# Patient Record
Sex: Female | Born: 1980
Health system: Southern US, Community
[De-identification: ages and names within clinical notes are randomized; demographics above are authoritative.]

## PROBLEM LIST (undated history)

## (undated) DIAGNOSIS — J45909 Unspecified asthma, uncomplicated: Secondary | ICD-10-CM

## (undated) DIAGNOSIS — E0590 Thyrotoxicosis, unspecified without thyrotoxic crisis or storm: Secondary | ICD-10-CM

## (undated) DIAGNOSIS — R011 Cardiac murmur, unspecified: Secondary | ICD-10-CM

## (undated) DIAGNOSIS — G8929 Other chronic pain: Secondary | ICD-10-CM

## (undated) DIAGNOSIS — E669 Obesity, unspecified: Secondary | ICD-10-CM

## (undated) DIAGNOSIS — M545 Low back pain, unspecified: Secondary | ICD-10-CM

## (undated) DIAGNOSIS — I071 Rheumatic tricuspid insufficiency: Secondary | ICD-10-CM

## (undated) DIAGNOSIS — R931 Abnormal findings on diagnostic imaging of heart and coronary circulation: Secondary | ICD-10-CM

## (undated) DIAGNOSIS — E0500 Thyrotoxicosis with diffuse goiter without thyrotoxic crisis or storm: Secondary | ICD-10-CM

## (undated) DIAGNOSIS — I340 Nonrheumatic mitral (valve) insufficiency: Secondary | ICD-10-CM

## (undated) DIAGNOSIS — D649 Anemia, unspecified: Secondary | ICD-10-CM

## (undated) DIAGNOSIS — K219 Gastro-esophageal reflux disease without esophagitis: Secondary | ICD-10-CM

## (undated) DIAGNOSIS — I10 Essential (primary) hypertension: Secondary | ICD-10-CM

## (undated) DIAGNOSIS — Z973 Presence of spectacles and contact lenses: Secondary | ICD-10-CM

## (undated) DIAGNOSIS — Z720 Tobacco use: Secondary | ICD-10-CM

## (undated) HISTORY — DX: Thyrotoxicosis, unspecified without thyrotoxic crisis or storm: E05.90

## (undated) HISTORY — DX: Anemia, unspecified: D64.9

## (undated) HISTORY — DX: Obesity, unspecified: E66.9

## (undated) HISTORY — DX: Abnormal findings on diagnostic imaging of heart and coronary circulation: R93.1

## (undated) HISTORY — DX: Tobacco use: Z72.0

## (undated) HISTORY — PX: MANDIBLE SURGERY: SHX707

## (undated) HISTORY — PX: ECTOPIC PREGNANCY SURGERY: SHX613

## (undated) HISTORY — DX: Nonrheumatic mitral (valve) insufficiency: I34.0

## (undated) HISTORY — PX: TUBAL LIGATION: SHX77

## (undated) HISTORY — DX: Rheumatic tricuspid insufficiency: I07.1

---
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
## 1998-12-19 ENCOUNTER — Encounter: Payer: Self-pay | Admitting: Emergency Medicine

## 1998-12-19 ENCOUNTER — Emergency Department (HOSPITAL_COMMUNITY): Admission: EM | Admit: 1998-12-19 | Discharge: 1998-12-19 | Payer: Self-pay | Admitting: Emergency Medicine

## 1999-07-24 ENCOUNTER — Other Ambulatory Visit: Admission: RE | Admit: 1999-07-24 | Discharge: 1999-07-24 | Payer: Self-pay | Admitting: Obstetrics and Gynecology

## 1999-08-16 ENCOUNTER — Encounter: Admission: RE | Admit: 1999-08-16 | Discharge: 1999-09-07 | Payer: Self-pay | Admitting: Orthopedic Surgery

## 2000-06-30 ENCOUNTER — Emergency Department (HOSPITAL_COMMUNITY): Admission: EM | Admit: 2000-06-30 | Discharge: 2000-06-30 | Payer: Self-pay

## 2000-06-30 ENCOUNTER — Encounter: Payer: Self-pay | Admitting: Emergency Medicine

## 2001-03-09 ENCOUNTER — Encounter: Payer: Self-pay | Admitting: Emergency Medicine

## 2001-03-09 ENCOUNTER — Emergency Department (HOSPITAL_COMMUNITY): Admission: EM | Admit: 2001-03-09 | Discharge: 2001-03-09 | Payer: Self-pay | Admitting: Emergency Medicine

## 2001-05-13 ENCOUNTER — Other Ambulatory Visit: Admission: RE | Admit: 2001-05-13 | Discharge: 2001-05-13 | Payer: Self-pay | Admitting: *Deleted

## 2003-01-31 ENCOUNTER — Emergency Department (HOSPITAL_COMMUNITY): Admission: EM | Admit: 2003-01-31 | Discharge: 2003-01-31 | Payer: Self-pay | Admitting: Emergency Medicine

## 2003-04-25 ENCOUNTER — Emergency Department (HOSPITAL_COMMUNITY): Admission: AD | Admit: 2003-04-25 | Discharge: 2003-04-25 | Payer: Self-pay | Admitting: Family Medicine

## 2003-07-06 ENCOUNTER — Inpatient Hospital Stay (HOSPITAL_COMMUNITY): Admission: AD | Admit: 2003-07-06 | Discharge: 2003-07-07 | Payer: Self-pay | Admitting: *Deleted

## 2003-07-09 ENCOUNTER — Inpatient Hospital Stay (HOSPITAL_COMMUNITY): Admission: AD | Admit: 2003-07-09 | Discharge: 2003-07-09 | Payer: Self-pay | Admitting: Obstetrics & Gynecology

## 2003-07-10 ENCOUNTER — Inpatient Hospital Stay (HOSPITAL_COMMUNITY): Admission: AD | Admit: 2003-07-10 | Discharge: 2003-07-10 | Payer: Self-pay | Admitting: Obstetrics and Gynecology

## 2003-07-13 ENCOUNTER — Inpatient Hospital Stay (HOSPITAL_COMMUNITY): Admission: AD | Admit: 2003-07-13 | Discharge: 2003-07-13 | Payer: Self-pay | Admitting: Obstetrics and Gynecology

## 2003-07-16 ENCOUNTER — Inpatient Hospital Stay (HOSPITAL_COMMUNITY): Admission: AD | Admit: 2003-07-16 | Discharge: 2003-07-16 | Payer: Self-pay | Admitting: Obstetrics and Gynecology

## 2003-07-23 ENCOUNTER — Inpatient Hospital Stay (HOSPITAL_COMMUNITY): Admission: AD | Admit: 2003-07-23 | Discharge: 2003-07-23 | Payer: Self-pay | Admitting: Obstetrics and Gynecology

## 2003-07-30 ENCOUNTER — Inpatient Hospital Stay (HOSPITAL_COMMUNITY): Admission: AD | Admit: 2003-07-30 | Discharge: 2003-07-30 | Payer: Self-pay | Admitting: Obstetrics and Gynecology

## 2003-08-06 ENCOUNTER — Inpatient Hospital Stay (HOSPITAL_COMMUNITY): Admission: AD | Admit: 2003-08-06 | Discharge: 2003-08-06 | Payer: Self-pay | Admitting: Obstetrics and Gynecology

## 2003-08-14 ENCOUNTER — Inpatient Hospital Stay (HOSPITAL_COMMUNITY): Admission: AD | Admit: 2003-08-14 | Discharge: 2003-08-16 | Payer: Self-pay | Admitting: Obstetrics and Gynecology

## 2003-08-16 ENCOUNTER — Encounter (INDEPENDENT_AMBULATORY_CARE_PROVIDER_SITE_OTHER): Payer: Self-pay | Admitting: Specialist

## 2004-05-01 ENCOUNTER — Emergency Department (HOSPITAL_COMMUNITY): Admission: EM | Admit: 2004-05-01 | Discharge: 2004-05-01 | Payer: Self-pay | Admitting: Emergency Medicine

## 2004-05-07 ENCOUNTER — Emergency Department (HOSPITAL_COMMUNITY): Admission: EM | Admit: 2004-05-07 | Discharge: 2004-05-07 | Payer: Self-pay | Admitting: Emergency Medicine

## 2005-03-08 ENCOUNTER — Emergency Department (HOSPITAL_COMMUNITY): Admission: EM | Admit: 2005-03-08 | Discharge: 2005-03-08 | Payer: Self-pay | Admitting: Family Medicine

## 2005-11-21 ENCOUNTER — Emergency Department (HOSPITAL_COMMUNITY): Admission: EM | Admit: 2005-11-21 | Discharge: 2005-11-21 | Payer: Self-pay | Admitting: Family Medicine

## 2006-03-11 ENCOUNTER — Emergency Department (HOSPITAL_COMMUNITY): Admission: EM | Admit: 2006-03-11 | Discharge: 2006-03-11 | Payer: Self-pay | Admitting: Emergency Medicine

## 2006-06-15 ENCOUNTER — Emergency Department (HOSPITAL_COMMUNITY): Admission: EM | Admit: 2006-06-15 | Discharge: 2006-06-15 | Payer: Self-pay | Admitting: Emergency Medicine

## 2006-10-03 ENCOUNTER — Other Ambulatory Visit: Admission: RE | Admit: 2006-10-03 | Discharge: 2006-10-03 | Payer: Self-pay | Admitting: Gynecology

## 2006-10-22 ENCOUNTER — Ambulatory Visit (HOSPITAL_COMMUNITY): Admission: RE | Admit: 2006-10-22 | Discharge: 2006-10-22 | Payer: Self-pay | Admitting: Gynecology

## 2007-02-22 ENCOUNTER — Emergency Department (HOSPITAL_COMMUNITY): Admission: EM | Admit: 2007-02-22 | Discharge: 2007-02-22 | Payer: Self-pay | Admitting: Emergency Medicine

## 2007-04-21 ENCOUNTER — Emergency Department (HOSPITAL_COMMUNITY): Admission: EM | Admit: 2007-04-21 | Discharge: 2007-04-21 | Payer: Self-pay | Admitting: Emergency Medicine

## 2007-12-30 ENCOUNTER — Emergency Department (HOSPITAL_COMMUNITY): Admission: EM | Admit: 2007-12-30 | Discharge: 2007-12-30 | Payer: Self-pay | Admitting: Emergency Medicine

## 2008-05-07 ENCOUNTER — Emergency Department (HOSPITAL_COMMUNITY): Admission: EM | Admit: 2008-05-07 | Discharge: 2008-05-07 | Payer: Self-pay | Admitting: Emergency Medicine

## 2008-08-03 ENCOUNTER — Emergency Department (HOSPITAL_COMMUNITY): Admission: EM | Admit: 2008-08-03 | Discharge: 2008-08-03 | Payer: Self-pay | Admitting: Family Medicine

## 2008-11-05 ENCOUNTER — Encounter: Admission: RE | Admit: 2008-11-05 | Discharge: 2008-11-05 | Payer: Self-pay | Admitting: Family Medicine

## 2008-11-14 ENCOUNTER — Inpatient Hospital Stay (HOSPITAL_COMMUNITY): Admission: EM | Admit: 2008-11-14 | Discharge: 2008-11-15 | Payer: Self-pay | Admitting: Emergency Medicine

## 2008-12-24 ENCOUNTER — Ambulatory Visit (HOSPITAL_BASED_OUTPATIENT_CLINIC_OR_DEPARTMENT_OTHER): Admission: RE | Admit: 2008-12-24 | Discharge: 2008-12-24 | Payer: Self-pay | Admitting: Otolaryngology

## 2009-02-02 ENCOUNTER — Emergency Department (HOSPITAL_COMMUNITY): Admission: EM | Admit: 2009-02-02 | Discharge: 2009-02-02 | Payer: Self-pay | Admitting: Emergency Medicine

## 2010-03-06 ENCOUNTER — Emergency Department (HOSPITAL_COMMUNITY): Admission: EM | Admit: 2010-03-06 | Discharge: 2010-03-06 | Payer: Self-pay | Admitting: Family Medicine

## 2010-04-23 HISTORY — PX: ECTOPIC PREGNANCY SURGERY: SHX613

## 2010-07-04 LAB — POCT RAPID STREP A (OFFICE): Streptococcus, Group A Screen (Direct): NEGATIVE

## 2010-07-28 LAB — BASIC METABOLIC PANEL
BUN: 5 mg/dL — ABNORMAL LOW (ref 6–23)
CO2: 23 mEq/L (ref 19–32)
GFR calc Af Amer: >60 mL/min (ref >60)
Glucose, Bld: 77 mg/dL (ref 70–99)
Potassium: 4.4 mEq/L (ref 3.5–5.1)

## 2010-07-30 LAB — BASIC METABOLIC PANEL
BUN: 8 mg/dL (ref 6–23)
CO2: 24 mEq/L (ref 19–32)
Calcium: 8.3 mg/dL — ABNORMAL LOW (ref 8.4–10.5)
Creatinine, Ser: 0.87 mg/dL (ref 0.4–1.2)
GFR calc Af Amer: >60 mL/min (ref >60)
GFR calc non Af Amer: >60 mL/min (ref >60)
Glucose, Bld: 103 mg/dL — ABNORMAL HIGH (ref 70–99)

## 2010-07-30 LAB — CBC
HCT: 38.4 % (ref 36.0–46.0)
Platelets: 251 10*3/uL (ref 150–400)
RBC: 4.06 MIL/uL (ref 3.87–5.11)
RDW: 12.9 % (ref 11.5–15.5)

## 2010-09-05 NOTE — Op Note (Signed)
NAMETATIONNA, FULLARD              ACCOUNT NO.:  [id number]   MEDICAL RECORD NO.:  [id number]          PATIENT TYPE:  INP   LOCATION:  5033                         FACILITY:  [facility]   PHYSICIAN:  Kristine Garbe. Ezzard Standing, M.D.DATE OF BIRTH:  09/01/1980   DATE OF PROCEDURE:  11/14/2008  DATE OF DISCHARGE:                               OPERATIVE REPORT   PREOPERATIVE DIAGNOSIS:  Open right body mandibular fracture.   POSTOPERATIVE DIAGNOSIS:  Open right body mandibular fracture.   OPERATION PERFORMED:  Closed reduction of right body mandibular fracture  with maxillomandibular fixation.  Closure of 3-cm intraoral right  laceration.   SURGEON:  Kristine Garbe. Ezzard Standing, MD   ANESTHESIA:  General endotracheal.   COMPLICATIONS:  None.   BLOOD LOSS:  Minimal.   BRIEF CLINICAL NOTE:  Anne [name] is a 30 year old female, who  reported that she fell when she got out of bed earlier tonight at about  midnight, landing on the right side of her face.  She was brought to the  emergency room, and CT scan demonstrated a displaced right body  mandibular fracture, which was opened intraorally.  She had lost the  crown of tooth #28 with the roots apparently still intact.  On  examination, she has poor alignment of her dentition and has an open  intraoral laceration with one missed tooth at approximately tooth #28.  She had several dental cavities on the molars, posterior on the left  side.  There is no external facial lacerations.  She is taken to the  operating room at this time for maxillomandibular fixation and reduction  of mandibular fracture.   DESCRIPTION OF PROCEDURE:  The patient was brought to the operating  room.  She received 1 g of Ancef in the ER and a second g of Ancef  preoperatively in the OR.  The mouth was prepped with Betadine and  rinsed out with saline.  Four maxillomandibular fixation screws were  placed, two 8-mm screws were placed superiorly just medial to the  incisors,  and then two 12-mm length screws were placed through the  mandible inferiorly just inferior to the incisors.  The fracture segment  was reduced, and the patient was brought into recently good occlusion.  A 24-gauge wire was brought through the maxillomandibular fixation  screws and twisted down to secure her occlusion and reduction of the  mandibular fracture.  There is a small laceration around the fracture  where she lost the crown of  tooth #28, and this was closed with 3 interrupted 4-0 chromic sutures.  She received a 10 mg of Decadron postoperatively.  The patient was  awoken from anesthesia and transferred to the recovery room, postop  doing well.   DISPOSITION:  The patient will be admitted for 24-hour observation on  liquid diet and perioperative antibiotics.           ______________________________  Kristine Garbe Ezzard Standing, M.D.     CEN/MEDQ  D:  11/14/2008  T:  11/14/2008  Job:  [id number]

## 2010-09-05 NOTE — H&P (Signed)
Anne [name], Anne [name]              ACCOUNT NO.:  [id number]   MEDICAL RECORD NO.:  [id number]          PATIENT TYPE:  INP   LOCATION:  5033                         FACILITY:  [facility]   PHYSICIAN:  Kristine Garbe. Ezzard Standing, M.D.DATE OF BIRTH:  [birth year]   DATE OF ADMISSION:  11/14/2008  DATE OF DISCHARGE:                              HISTORY & PHYSICAL   REASON FOR ADMISSION:  Mandibular fracture.   HISTORY OF PRESENTING ILLNESS:  Anne [name] is a 30 year old female,  who is getting out of bed about midnight today, fell landing on the  right side of her face, sustaining a displaced open right mandibular  fracture of the body of the mandible.  She presented to the emergency  room in no acute distress except for some pain of the jaw.   PAST MEDICAL HISTORY:  Significant for hypertension for which she was  prescribed medication a couple of weeks ago, but has not taken any  medication for hypertension presently.   ALLERGIES:  She has no known drug allergies.   MEDICATIONS:  No other medications.   SOCIAL HISTORY:  She does smoke about a pack a day.   PHYSICAL EXAMINATION:  GENERAL:  The patient with some mild swelling on  right side of her jaw and in no acute distress.  INTRAORAL:  The crown of tooth #28 to be missing with an open fracture  of the body and mandible on the parasymphyseal area in the right side.  NECK:  No trauma.  No swelling.  No adenopathy.  CARDIAC:  Regular rate and rhythm without murmurs.  LUNGS:  Clear.  ABDOMEN:  Soft and nontender.  EXTREMITIES:  No evidence of trauma.   IMPRESSION:  Right displaced body mandibular fracture.   PLAN:  The patient will be admitted for a maxillomandibular fixation of  a right body mandibular fracture.  She received 1 g Ancef in the ER.           ______________________________  Kristine Garbe. Ezzard Standing, M.D.     CEN/MEDQ  D:  11/14/2008  T:  11/14/2008  Job:  [id number]

## 2010-09-08 NOTE — Discharge Summary (Signed)
NAME:  Anne [name], Anne [name]                       ACCOUNT NO.:  [id number]   MEDICAL RECORD NO.:  [id number]                   PATIENT TYPE:  INP   LOCATION:  9318                                 FACILITY[facility]  [facility]   PHYSICIAN:  Zenaida Niece, M.D.             DATE OF BIRTH:  08-23-1980   DATE OF ADMISSION:  08/14/2003  DATE OF DISCHARGE:                                 DISCHARGE SUMMARY   ADMISSION DIAGNOSIS:  Ectopic pregnancy.   DISCHARGE DIAGNOSIS:  Rupturing right ectopic pregnancy.   PROCEDURE:  Laparotomy with partial right salpingectomy and adhesiolysis.   HISTORY AND PHYSICAL:  Briefly, this is a 30 year old black female gravida 1  para 0 with an LMP of August 14, 2003 who has been followed for a known  ectopic pregnancy.  She has had methotrexate x2.  She has had a falling beta  hCG although the last one was not optimal.  She came in on the day of  admission complaining of vaginal bleeding and right lower quadrant pain.  Ultrasound revealed an increasing mass in the right cornua of the uterus  with free fluid in the abdomen.  The hCG had decreased from approximately  3400 to 689.  Hemoglobin was stable at 11.5.  Past history is  noncontributory.  Physical exam significant for an abdomen that was soft,  nondistended, tender in the right lower quadrant with rebound and guarding.   HOSPITAL COURSE:  The patient admitted and taken to the operating room.  She  had a laparotomy with a right partial salpingectomy for a large ectopic at  the origin of the right tube that was in the process of rupturing.  It is  possible there was a cornual component to this.  There was approximately 200  mL of blood in the abdomen, 150 mL of blood loss from the actual surgery.  The patient tolerated the procedure well.  Postoperatively she had no  complications.  Preoperative hemoglobin 11.5, postoperative 10.9.  On  postoperative day #2 she was ambulating, tolerating a diet, and felt to be  stable  for discharge home.   DISCHARGE INSTRUCTIONS:  Regular diet, pelvic rest, no strenuous activity.  Follow up is in 2 days for staple removal.   MEDICATIONS:  1. Percocet #40 one to two p.o. q.4-6h. p.r.n. pain.  2. Over-the-counter ibuprofen as needed.                                               Zenaida Niece, M.D.    TDM/MEDQ  D:  08/16/2003  T:  08/16/2003  Job:  [id number]

## 2010-09-08 NOTE — Op Note (Signed)
NAME:  Anne [name], Anne [name]                       ACCOUNT NO.:  [id number]   MEDICAL RECORD NO.:  [id number]                   PATIENT TYPE:  INP   LOCATION:  9318                                 [facility]   PHYSICIAN:  Zenaida Niece, M.D.             DATE OF BIRTH:  05/12/1980   DATE OF PROCEDURE:  08/14/2003  DATE OF DISCHARGE:                                 OPERATIVE REPORT   PREOPERATIVE DIAGNOSIS:  Right ectopic pregnancy.   POSTOPERATIVE DIAGNOSIS:  Rupturing right ectopic pregnancy.   PROCEDURE:  Laparotomy with partial right salpingectomy.   SURGEON:  Zenaida Niece, M.D.   ANESTHESIA:  General endotracheal tube.   ESTIMATED BLOOD LOSS:  From the case, 150 cc.  Plus she had approximately  200 cc of blood in her abdomen.   FINDINGS:  Large ectopic pregnancy at the origin of the right tube with  possible some component of a cornual pregnancy.  The omentum was adherent to  the ectopic, which was bleeding and in a process of rupturing.  There were  some adhesions around the left ovary.   PROCEDURE IN DETAIL:  The patient was taken to the operating room and placed  in the dorsal supine position.  General anesthesia was induced.  The abdomen  was prepped and draped in the usual sterile fashion and a Foley catheter  inserted.  The abdomen was then entered via a small Pfannenstiel incision.  Once the peritoneum was entered, there was noted to be blood in the  abdominal cavity and this was suctioned out.  A self-retaining retractor was  placed and bowels packed out of the pelvis with one pack.  Omentum was  adherent to this ectopic area and was peeled off easily and was found to be  hemostatic.  The uterus was elevated in the incision and there was noted to  be a large right ectopic pregnancy at the origin of the right tube.  Kelly  clamps were used to isolate this area proximally and distally.  The area was  then removed sharply and sent as a specimen.  All pedicles  were sutured with  0 Vicryl with adequate hemostasis.  The uterine side did appear to go at  least minimally into the myometrium.  This required several additional  sutures of 0 Vicryl as well as a running layer of 3-0 Vicryl to achieve  hemostasis.  At the end of the procedure all pedicles were hemostatic.   On the left side there were noted to be some adhesions of the left ovary  down posterior to the uterus.  Some of these were taken down with  electrocautery.  Some of these adhesions were quite dense and I was unable  to take them all down.  The left tube appeared to be functionally normal.  The right tube and ovary otherwise appeared  normal.  The pelvis was  copiously irrigated and all clots and debris  were removed, the area where  the ectopic was removed and again found to be hemostatic.  The pack was  removed.  All instruments were removed from the abdomen.  The subfascial  space was inspected and found to be hemostatic.  The fascia was closed in a  running fashion starting at both ends and meeting in the middle with 0  Vicryl.  The subcutaneous tissue was irrigated and made hemostatic with  electrocautery.  The subcutaneous incision was then closed with running 3-0  plain gut suture.  The skin was then closed with staples and sterile  dressing.   The patient tolerated the procedure well and was taken to the recovery room  in stable condition.  Counts were correct x 2, and she received Ancef 1 g  prior to the procedure.                                               Zenaida Niece, M.D.    TDM/MEDQ  D:  08/14/2003  T:  08/16/2003  Job:  [id number]

## 2010-09-20 ENCOUNTER — Inpatient Hospital Stay (INDEPENDENT_AMBULATORY_CARE_PROVIDER_SITE_OTHER)
Admission: RE | Admit: 2010-09-20 | Discharge: 2010-09-20 | Disposition: A | Discharge status: Home / Self Care | Payer: Self-pay | Source: Ambulatory Visit | Attending: Emergency Medicine | Admitting: Emergency Medicine

## 2010-09-20 DIAGNOSIS — T7840XA Allergy, unspecified, initial encounter: Secondary | ICD-10-CM

## 2011-07-02 ENCOUNTER — Emergency Department (HOSPITAL_COMMUNITY)
Admission: EM | Admit: 2011-07-02 | Discharge: 2011-07-02 | Disposition: A | Discharge status: Home / Self Care | Payer: Self-pay | Attending: Emergency Medicine | Admitting: Emergency Medicine

## 2011-07-02 ENCOUNTER — Encounter (HOSPITAL_COMMUNITY): Payer: Self-pay | Admitting: *Deleted

## 2011-07-02 DIAGNOSIS — R059 Cough, unspecified: Secondary | ICD-10-CM | POA: Insufficient documentation

## 2011-07-02 DIAGNOSIS — R05 Cough: Secondary | ICD-10-CM | POA: Insufficient documentation

## 2011-07-02 DIAGNOSIS — R070 Pain in throat: Secondary | ICD-10-CM | POA: Insufficient documentation

## 2011-07-02 DIAGNOSIS — F17200 Nicotine dependence, unspecified, uncomplicated: Secondary | ICD-10-CM | POA: Insufficient documentation

## 2011-07-02 DIAGNOSIS — IMO0001 Reserved for inherently not codable concepts without codable children: Secondary | ICD-10-CM | POA: Insufficient documentation

## 2011-07-02 DIAGNOSIS — J069 Acute upper respiratory infection, unspecified: Secondary | ICD-10-CM

## 2011-07-02 MED ORDER — AZITHROMYCIN 250 MG PO TABS: 250.0000 mg | ORAL_TABLET | Freq: Every day | ORAL | Status: AC

## 2011-07-02 NOTE — ED Notes (Signed)
Pt reports sore throat, cough, body aches x 3 days. Fever/chills. No nausea/vomiting/abd pain.

## 2011-07-02 NOTE — Discharge Instructions (Signed)
TAKE ZITHROMAX AS PRESCRIBED. RECOMMEND PLAIN SALINE NASAL SPRAY, TYLENOL AND FLUIDS FOR SYMPTOMATIC TREATMENT. FOLLOW UP WITH YOUR DOCTOR AS NEEDED FOR PERSISTENT SYMPTOMS.  Upper Respiratory Infection, Adult An upper respiratory infection (URI) is also known as the common cold. It is often caused by a type of germ (virus). Colds are easily spread (contagious). You can pass it to others by kissing, coughing, sneezing, or drinking out of the same glass. Usually, you get better in 1 or 2 weeks.  HOME CARE   Only take medicine as told by your doctor.   Use a warm mist humidifier or breathe in steam from a hot shower.   Drink enough water and fluids to keep your pee (urine) clear or pale yellow.   Get plenty of rest.   Return to work when your temperature is back to normal or as told by your doctor. You may use a face mask and wash your hands to stop your cold from spreading.  GET HELP RIGHT AWAY IF:   After the first few days, you feel you are getting worse.   You have questions about your medicine.   You have chills, shortness of breath, or brown or red spit (mucus).   You have yellow or brown snot (nasal discharge) or pain in the face, especially when you bend forward.   You have a fever, puffy (swollen) neck, pain when you swallow, or white spots in the back of your throat.   You have a bad headache, ear pain, sinus pain, or chest pain.   You have a high-pitched whistling sound when you breathe in and out (wheezing).   You have a lasting cough or cough up blood.   You have sore muscles or a stiff neck.  MAKE SURE YOU:   Understand these instructions.   Will watch your condition.   Will get help right away if you are not doing well or get worse.  Document Released: 09/26/2007 Document Revised: 03/29/2011 Document Reviewed: 08/14/2010 [facility] Patient Information 2012 [place], [place].

## 2011-07-02 NOTE — ED Provider Notes (Signed)
History     CSN: [id number]  Arrival date & time 07/02/11  1148   First MD Initiated Contact with Patient 07/02/11 1344      Chief Complaint  Patient presents with  . Sore Throat  . Generalized Body Aches  . Cough    (Consider location/radiation/quality/duration/timing/severity/associated sxs/prior treatment) Patient is a 31 y.o. female presenting with pharyngitis and cough. The history is provided by the patient.  Sore Throat This is a new problem. The current episode started in the past 7 days. The problem occurs constantly. The problem has been unchanged. Associated symptoms include congestion, coughing, a fever, myalgias and a sore throat. Pertinent negatives include no nausea or rash. The symptoms are aggravated by nothing.  Cough Associated symptoms include sore throat and myalgias.    History reviewed. No pertinent past medical history.  Past Surgical History  Procedure Date  . Ectopic pregnancy surgery     No family history on file.  History  Substance Use Topics  . Smoking status: Current Everyday Smoker    Types: Cigarettes  . Smokeless tobacco: Not on file  . Alcohol Use: Yes     once a week    OB History    Grav Para Term Preterm Abortions TAB SAB Ect Mult Living                  Review of Systems  Constitutional: Positive for fever.  HENT: Positive for congestion and sore throat.   Eyes: Negative.   Respiratory: Positive for cough.   Cardiovascular: Negative.   Gastrointestinal: Negative.  Negative for nausea.  Genitourinary: Negative.   Musculoskeletal: Positive for myalgias.  Skin: Negative for rash.    Allergies  Food allergy formula  Home Medications   Current Outpatient Rx  Name Route Sig Dispense Refill  . ACETAMINOPHEN-GUAIFENESIN 1000-400 MG PO PACK Oral Take 1 packet by mouth every 8 (eight) hours.    . GUAIFENESIN-DM 100-10 MG/5ML PO SYRP Oral Take 10 mLs by mouth 3 (three) times daily as needed. cold    .  PSEUDOEPHEDRINE-APAP-DM 78-295-62 MG/30ML PO LIQD Oral Take 30 mLs by mouth every 6 (six) hours as needed. Cold /flu      BP 135/83  Pulse 74  Temp(Src) 98.2 F (36.8 C) (Oral)  Resp 16  SpO2 100%  Physical Exam  Constitutional: She appears well-developed and well-nourished.  HENT:  Head: Normocephalic.  Right Ear: External ear normal.  Left Ear: External ear normal.  Nose: Mucosal edema present. Right sinus exhibits frontal sinus tenderness. Left sinus exhibits frontal sinus tenderness.  Mouth/Throat: Posterior oropharyngeal erythema present. No oropharyngeal exudate or tonsillar abscesses.  Neck: Normal range of motion. Neck supple.  Cardiovascular: Normal rate and regular rhythm.   No murmur heard. Pulmonary/Chest: Effort normal and breath sounds normal. She has no wheezes. She has no rales.       Diffuse rhonchi lower lobes.  Abdominal: Soft. Bowel sounds are normal. There is no tenderness. There is no rebound and no guarding.  Musculoskeletal: Normal range of motion.  Neurological: She is alert. No cranial nerve deficit.  Skin: Skin is warm and dry. No rash noted.  Psychiatric: She has a normal mood and affect.    ED Course  Procedures (including critical care time)  Labs Reviewed - No data to display No results found.   No diagnosis found.    MDM          Rodena Medin, PA-C 07/02/11 1422

## 2011-07-04 NOTE — Progress Notes (Signed)
On 07/02/11 Pt listed as self pay with no insurance coverage Pt confirms he is self pay [place] resident CM and [facility] coordinator spoke with him Pt offered [facility] services to assist with finding a [place] self pay provider Pt accepted information    

## 2011-07-04 NOTE — Progress Notes (Signed)
Correction On 07/02/11 Pt listed as self pay with no insurance coverage Pt confirms she is self pay [place] resident CM and [facility] coordinator spoke with her Pt offered [facility] services to assist with finding a [place] self pay provider Pt stated she was not interested in information

## 2011-07-19 NOTE — ED Provider Notes (Signed)
Evaluation and management procedures were performed by the PA/NP under my supervision/collaboration.   Media Pizzini D Doha Boling, MD 07/19/11 1013 

## 2011-11-22 ENCOUNTER — Emergency Department (HOSPITAL_COMMUNITY): Payer: Self-pay

## 2011-11-22 ENCOUNTER — Encounter (HOSPITAL_COMMUNITY): Payer: Self-pay | Admitting: Emergency Medicine

## 2011-11-22 ENCOUNTER — Emergency Department (HOSPITAL_COMMUNITY)
Admission: EM | Admit: 2011-11-22 | Discharge: 2011-11-22 | Disposition: A | Discharge status: Home / Self Care | Payer: Self-pay | Attending: Emergency Medicine | Admitting: Emergency Medicine

## 2011-11-22 DIAGNOSIS — F17200 Nicotine dependence, unspecified, uncomplicated: Secondary | ICD-10-CM | POA: Insufficient documentation

## 2011-11-22 DIAGNOSIS — M25569 Pain in unspecified knee: Secondary | ICD-10-CM | POA: Insufficient documentation

## 2011-11-22 MED ORDER — OXYCODONE-ACETAMINOPHEN 5-325 MG PO TABS
1.0000 | ORAL_TABLET | Freq: Once | ORAL | Status: AC
  Administered 2011-11-22: 1 via ORAL
  Filled 2011-11-22: qty 1

## 2011-11-22 MED ORDER — OXYCODONE-ACETAMINOPHEN 5-325 MG PO TABS: 1.0000 | ORAL_TABLET | Freq: Four times a day (QID) | ORAL | Status: AC | PRN

## 2011-11-22 MED ORDER — IBUPROFEN 600 MG PO TABS: 600.0000 mg | ORAL_TABLET | Freq: Four times a day (QID) | ORAL | Status: AC | PRN

## 2011-11-22 NOTE — ED Notes (Signed)
Pt reports since Thursday that her R knee has been swollen and painful; has been taking ibuprofen, using ice, and elevation with no relief; pt ambulatory at triage; pt has knee brace on and tight jeans- unable to assess knee; CMS intact

## 2011-11-22 NOTE — ED Provider Notes (Signed)
History   This chart was scribed for Anne Chick, MD by Shari Heritage. The patient was seen in room TR06C/TR06C. Patient's care was started at 1522.     CSN: [id number]  Arrival date & time 11/22/11  1522   None     Chief Complaint  Patient presents with  . Knee Pain    (Consider location/radiation/quality/duration/timing/severity/associated sxs/prior treatment) Patient is a 31 y.o. female presenting with knee pain. The history is provided by the patient. No language interpreter was used.  Knee Pain This is a recurrent problem. The current episode started more than 2 days ago. The problem occurs constantly. The problem has not changed since onset.The symptoms are aggravated by standing and walking. Nothing relieves the symptoms. She has tried a cold compress (Ibuprofen, Elevation) for the symptoms. The treatment provided no relief.    [name] is a 31 y.o. female who presents to the Emergency Department complaining of moderate to severe, constant right knee pain with associated swelling and nausea onset 7 days ago. Patient denies any obvious injury or trauma. Patient denies fever and vomiting. Patient states that she has a recent history of joint swelling in her knee, but it was resolved after a few days. Patient arrived in the ED with a knee immobilizer that her brother gave her. Patient has been taking Ibuprofen, applying ice and elevating her right leg with no relief. Patient hasn't seen her PCP or a specialist for this complaint. Patient is ambulatory. Patient denies history of any STIs. Patient has a surgical history of ectopic pregnancy surgery and mandible surgery. Patient is a current everyday smoker.  History reviewed. No pertinent past medical history.  Past Surgical History  Procedure Date  . Ectopic pregnancy surgery   . Mandible surgery     History reviewed. No pertinent family history.  History  Substance Use Topics  . Smoking status: Current Everyday  Smoker -- 0.5 packs/day    Types: Cigarettes  . Smokeless tobacco: Not on file  . Alcohol Use: Yes     occasion    OB History    Grav Para Term Preterm Abortions TAB SAB Ect Mult Living                  Review of Systems  Gastrointestinal: Positive for nausea.  Musculoskeletal: Positive for joint swelling.       Patient is positive for right knee pain.  All other systems reviewed and are negative.    Allergies  Food allergy formula  Home Medications   Current Outpatient Rx  Name Route Sig Dispense Refill  . IBUPROFEN 200 MG PO TABS Oral Take 400 mg by mouth every 4 (four) hours as needed. For pain.    . IBUPROFEN 600 MG PO TABS Oral Take 1 tablet (600 mg total) by mouth every 6 (six) hours as needed for pain. 30 tablet 0  . OXYCODONE-ACETAMINOPHEN 5-325 MG PO TABS Oral Take 1-2 tablets by mouth every 6 (six) hours as needed for pain. 15 tablet 0    BP 117/76  Pulse 109  Temp 97.2 F (36.2 C) (Oral)  Resp 18  Ht 5\' 11"  (1.803 m)  Wt 250 lb (113.399 kg)  BMI 34.87 kg/m2  SpO2 97%  LMP 11/04/2011  Physical Exam  Nursing note and vitals reviewed. Constitutional: She is oriented to person, place, and time. She appears well-developed and well-nourished. No distress.  HENT:  Head: Normocephalic and atraumatic.  Eyes: EOM are normal. Pupils are equal, round,  and reactive to light.  Neck: Neck supple. No tracheal deviation present.  Cardiovascular: Normal rate.   Pulmonary/Chest: Effort normal. No respiratory distress.  Abdominal: Soft. She exhibits no distension.  Musculoskeletal: Normal range of motion. She exhibits no edema.       There is edema present in right knee. Mild tenderness to palpation in right knee. Full extension and flexion of the knee. No erythema. No warmth.  Neurological: She is alert and oriented to person, place, and time. No sensory deficit.  Skin: Skin is warm and dry.  Psychiatric: She has a normal mood and affect. Her behavior is normal.     ED Course  Procedures (including critical care time) DIAGNOSTIC STUDIES: Oxygen Saturation is 98% on room air, normal by my interpretation.    COORDINATION OF CARE: 4:28pm- Patient informed of current plan for treatment and evaluation and agrees with plan at this time. Will admininster 1 tablet of Percocet 5-325 mg. Have ordered an X-ray of patient's right knee.   Labs Reviewed - No data to display  Dg Knee Complete 4 Views Right  11/22/2011  *RADIOLOGY REPORT*  Clinical Data: Swelling and pain with weightbearing.  RIGHT KNEE - COMPLETE 4+ VIEW  Comparison: None.  Findings: Small joint effusion is present.  No evidence for acute fracture or subluxation.  No significant degenerative changes.  IMPRESSION: Small joint effusion.  Original Report Authenticated By: Patterson Hammersmith, M.D.     1. Knee pain       MDM  Pt presents with c/o right knee swelling and pain.  No erythema or warmth overlying or fever to suggest septic joint.  Pt states this has also happened in the past and resolved on its own.  Suspect arthritis.  No hx of STDS as well.  Pt placed in knee immobilizer for comfort, Discharged with strict return precautions.  Pt agreeable with plan.     I personally performed the services described in this documentation, which was scribed in my presence. The recorded information has been reviewed and considered.    Anne Chick, MD 11/23/11 [phone]

## 2011-11-22 NOTE — ED Notes (Signed)
Patient transported to X-ray 

## 2011-11-22 NOTE — Progress Notes (Signed)
Orthopedic Tech Progress Note Patient Details:  Anne [name] [birth year] [id number]  Ortho Devices Type of Ortho Device: Knee Immobilizer Ortho Device/Splint Location: (R) LE Ortho Device/Splint Interventions: Application   Jennye Moccasin 11/22/2011, 5:40 PM

## 2011-11-22 NOTE — Discharge Instructions (Signed)
Return to the ED with any concerns including fever, redness overlying knee, worsening pain or swelling, numbness/discoloration of leg foot or toes, or any other alarming symptoms

## 2011-11-22 NOTE — ED Notes (Signed)
Pt has mild swelling to rt knee after walking more than usual.

## 2011-11-22 NOTE — ED Notes (Signed)
Ortho paged for knee immobilizer

## 2012-01-13 ENCOUNTER — Emergency Department (HOSPITAL_COMMUNITY)
Admission: EM | Admit: 2012-01-13 | Discharge: 2012-01-13 | Disposition: A | Discharge status: Home / Self Care | Payer: Self-pay | Attending: Emergency Medicine | Admitting: Emergency Medicine

## 2012-01-13 ENCOUNTER — Encounter (HOSPITAL_COMMUNITY): Payer: Self-pay | Admitting: *Deleted

## 2012-01-13 ENCOUNTER — Emergency Department (HOSPITAL_COMMUNITY): Payer: Self-pay

## 2012-01-13 DIAGNOSIS — M25569 Pain in unspecified knee: Secondary | ICD-10-CM | POA: Insufficient documentation

## 2012-01-13 DIAGNOSIS — Z91018 Allergy to other foods: Secondary | ICD-10-CM | POA: Insufficient documentation

## 2012-01-13 DIAGNOSIS — F17200 Nicotine dependence, unspecified, uncomplicated: Secondary | ICD-10-CM | POA: Insufficient documentation

## 2012-01-13 DIAGNOSIS — M25562 Pain in left knee: Secondary | ICD-10-CM

## 2012-01-13 MED ORDER — OXYCODONE-ACETAMINOPHEN 5-325 MG PO TABS: ORAL_TABLET | ORAL | Status: DC

## 2012-01-13 MED ORDER — OXYCODONE-ACETAMINOPHEN 5-325 MG PO TABS
2.0000 | ORAL_TABLET | Freq: Once | ORAL | Status: AC
  Administered 2012-01-13: 2 via ORAL
  Filled 2012-01-13: qty 2

## 2012-01-13 NOTE — ED Provider Notes (Signed)
History     CSN: [id number]  Arrival date & time 01/13/12  4098   First MD Initiated Contact with Patient 01/13/12 2002      Chief Complaint  Patient presents with  . Knee Pain    (Consider location/radiation/quality/duration/timing/severity/associated sxs/prior treatment) HPI  31 y.o. female in no acute distress complaining of pain to left knee worsening over the course of 3 days. Pain is severe at 8/10 and exacerbated by weightbearing. Patient had prior similar episode of this to the right knee. She notes it swelling. Denies trauma, fever, history of STDs. Patient is taking 600 mg of ibuprofen a day with minimal relief.  History reviewed. No pertinent past medical history.  Past Surgical History  Procedure Date  . Ectopic pregnancy surgery   . Mandible surgery     History reviewed. No pertinent family history.  History  Substance Use Topics  . Smoking status: Current Every Day Smoker -- 0.5 packs/day    Types: Cigarettes  . Smokeless tobacco: Not on file  . Alcohol Use: Yes     occasion    OB History    Grav Para Term Preterm Abortions TAB SAB Ect Mult Living                  Review of Systems  Constitutional: Negative for fever.  Respiratory: Negative for shortness of breath.   Cardiovascular: Negative for chest pain.  Gastrointestinal: Negative for nausea, vomiting, abdominal pain and diarrhea.  Genitourinary: Negative for dysuria.  Musculoskeletal: Positive for arthralgias.  All other systems reviewed and are negative.    Allergies  Food allergy formula  Home Medications   Current Outpatient Rx  Name Route Sig Dispense Refill  . IBUPROFEN 200 MG PO TABS Oral Take 400 mg by mouth every 4 (four) hours as needed. For pain.    . IBUPROFEN 600 MG PO TABS Oral Take 600 mg by mouth every 6 (six) hours as needed. For pain      BP 161/108  Pulse 91  Temp 98.3 F (36.8 C) (Oral)  Resp 20  SpO2 100%  LMP 01/06/2012  Physical Exam  Nursing note and  vitals reviewed. Constitutional: She is oriented to person, place, and time. She appears well-developed and well-nourished. No distress.  HENT:  Head: Normocephalic.  Eyes: Conjunctivae normal and EOM are normal.  Cardiovascular: Normal rate.   Pulmonary/Chest: Effort normal. No stridor.  Musculoskeletal: Normal range of motion.       Left Knee: No warmth, No deformity, erythema or abrasions. FROM. Mild effusion to lower lateral patellar area, No crepitance. Anterior and posterior drawer show no abnormal laxity. Stable to valgus and varus stress. Joint lines are non-tender. Neurovascularly intact. Pt ambulates antalgic gait.   Neurological: She is alert and oriented to person, place, and time.  Psychiatric: She has a normal mood and affect.    ED Course  Procedures (including critical care time)  Labs Reviewed - No data to display Dg Knee Complete 4 Views Left  01/13/2012  *RADIOLOGY REPORT*  Clinical Data: Left knee pain and swelling for 2 weeks.  No known injury.  LEFT KNEE - COMPLETE 4+ VIEW  Comparison: None.  Findings: Small knee effusion is present.  There is mild medial joint space narrowing.  The lateral joint space appears preserved. There is no fracture.  Patellofemoral joint appears normal.  IMPRESSION: Small effusion.  No acute osseous abnormality.   Original Report Authenticated By: Andreas Newport, M.D.      1. Arthralgia  of knee, left       MDM  Doubt septic joint as patient is afebrile, joint is not warm, no history of STDs. I will give her crutches and a knee sleeve for symptomatic relief. X-rays negative.  Pt verbalized understanding and agrees with care plan. Outpatient follow-up and return precautions given.     New Prescriptions   OXYCODONE-ACETAMINOPHEN (PERCOCET/ROXICET) 5-325 MG PER TABLET    1 to 2 tabs PO q6hrs  PRN for pain        Wynetta Emery, PA-C 01/13/12 2144

## 2012-01-13 NOTE — Progress Notes (Signed)
Orthopedic Tech Progress Note Patient Details:  Anne [name] 11/09/1980 [id number] Knee sleeve applied to Left LE. Patient refused crutches.  Ortho Devices Type of Ortho Device: Knee Sleeve;Crutches Ortho Device/Splint Location: Applied to Left LE Ortho Device/Splint Interventions: Application   Asia R Thompson 01/13/2012, 8:42 PM

## 2012-01-13 NOTE — ED Notes (Signed)
Pt refused crutches.

## 2012-01-13 NOTE — ED Notes (Signed)
To ED for eval of left knee pain. States she has a 'knot' on knee. No injury noted

## 2012-01-14 NOTE — ED Provider Notes (Signed)
Medical screening examination/treatment/procedure(s) were performed by non-physician practitioner and as supervising physician I was immediately available for consultation/collaboration.  Cyndra Numbers, MD 01/14/12 [phone]

## 2012-05-22 ENCOUNTER — Other Ambulatory Visit (HOSPITAL_COMMUNITY): Payer: Self-pay | Admitting: Obstetrics & Gynecology

## 2012-05-22 DIAGNOSIS — M5137 Other intervertebral disc degeneration, lumbosacral region: Secondary | ICD-10-CM

## 2012-05-30 ENCOUNTER — Ambulatory Visit (HOSPITAL_COMMUNITY)
Admission: RE | Admit: 2012-05-30 | Discharge: 2012-05-30 | Disposition: A | Discharge status: Home / Self Care | Payer: BC Managed Care – PPO | Source: Ambulatory Visit | Attending: Obstetrics & Gynecology | Admitting: Obstetrics & Gynecology

## 2012-05-30 DIAGNOSIS — N971 Female infertility of tubal origin: Secondary | ICD-10-CM

## 2012-05-30 DIAGNOSIS — N949 Unspecified condition associated with female genital organs and menstrual cycle: Secondary | ICD-10-CM | POA: Insufficient documentation

## 2012-05-30 DIAGNOSIS — M5137 Other intervertebral disc degeneration, lumbosacral region: Secondary | ICD-10-CM

## 2012-05-30 MED ORDER — IOHEXOL 300 MG/ML  SOLN
20.0000 mL | Freq: Once | INTRAMUSCULAR | Status: AC | PRN
  Administered 2012-05-30: 5 mL

## 2012-07-08 ENCOUNTER — Ambulatory Visit: Payer: BC Managed Care – PPO

## 2012-07-08 ENCOUNTER — Ambulatory Visit (INDEPENDENT_AMBULATORY_CARE_PROVIDER_SITE_OTHER): Payer: BC Managed Care – PPO | Admitting: Family Medicine

## 2012-07-08 VITALS — BP 138/90 | HR 77 | Temp 98.2°F | Resp 18 | Ht 69.5 in | Wt 277.6 lb

## 2012-07-08 DIAGNOSIS — M12369 Palindromic rheumatism, unspecified knee: Secondary | ICD-10-CM

## 2012-07-08 DIAGNOSIS — M25562 Pain in left knee: Secondary | ICD-10-CM

## 2012-07-08 DIAGNOSIS — M25569 Pain in unspecified knee: Secondary | ICD-10-CM

## 2012-07-08 LAB — POCT CBC
Granulocyte percent: 66.7 %G (ref 37–80)
MID (cbc): 0.5 (ref 0–0.9)
POC Granulocyte: 7.5 — AB (ref 2–6.9)
POC LYMPH PERCENT: 28.4 %L (ref 10–50)
Platelet Count, POC: 283 10*3/uL (ref 142–424)
RBC: 4.4 M/uL (ref 4.04–5.48)
RDW, POC: 13.9 %

## 2012-07-08 LAB — POCT SEDIMENTATION RATE: POCT SED RATE: 13 mm/hr (ref 0–22)

## 2012-07-08 LAB — POCT URINE PREGNANCY: Preg Test, Ur: NEGATIVE

## 2012-07-08 MED ORDER — HYDROCODONE-ACETAMINOPHEN 5-325 MG PO TABS: 1.0000 | ORAL_TABLET | Freq: Four times a day (QID) | ORAL | Status: DC | PRN

## 2012-07-08 MED ORDER — MELOXICAM 15 MG PO TABS: 15.0000 mg | ORAL_TABLET | Freq: Every day | ORAL | Status: DC

## 2012-07-08 NOTE — Patient Instructions (Addendum)
Call and schedule a follow up with Dr. Penni Bombard at [facility] for 2 weeks from now or as soon as possible.  Take Mobic and Ultram as needed for inflammation and pain.  Use the hinged knee brace at work and whenever you do activities.  When you are at home on the couch you can take the brace off, elevate your knee, and ice.  Ice your knee for 15-20 minutes at least twice daily.  If you start to develop fever or you notice increased redness of your knee before you see Dr. Penni Bombard return to the clinic for re-evaluation.

## 2012-07-08 NOTE — Progress Notes (Signed)
Subjective:    Patient ID: Anne [name], female    DOB: 03-Apr-1981, 32 y.o.   MRN: [id number]  HPI  A 32 year old female presents with LEFT knee pain since 11/2011.  Pt went to [facility] ER on 01/13/12 where she had a LEFT knee xray that showed a small effusion. She was prescribed Ibuprofen and Percocet along with a knee sleeve, and referred to ortho..  On 02/01/12 the patient was seen by Dr. Penni Bombard at [facility] where she was diagnosed with knee pain and knee effusion.  She was prescribed Etodolac and told to follow up in 2 weeks.  The patient never followed up.  Today pt says the pain and swelling in her LEFT knee have gradually worsened since 01/2012.  She describes her pain as sharp that is aggravated by weightbearing activities such as standing and walking.  Admits to decreased ROM with flexion.  Pt denies known injury, prior LEFT knee problems or surgeries, n/t, fever, increased redness, new activities, repetitive motions/squatting/bending.  Pt has tried Aleve, Ibuprofen, Tylenol, and the knee brace with minimal relief.   History reviewed. No pertinent past medical history.  Past Surgical History  Procedure Laterality Date  . Ectopic pregnancy surgery    . Mandible surgery    . Tubal ligation      Prior to Admission medications   Medication Sig Start Date End Date Taking? Authorizing Provider  amoxicillin (AMOXIL) 500 MG capsule Take 500 mg by mouth 3 (three) times daily.   Yes Historical Provider, MD    Allergies  Allergen Reactions  . Food Allergy Formula Anaphylaxis and Swelling    coconut    History   Social History  . Marital Status: Married    Spouse Name: N/A    Number of Children: N/A  . Years of Education: N/A   Occupational History  . Not on file.   Social History Main Topics  . Smoking status: Current Every Day Smoker -- 1.00 packs/day for 10 years    Types: Cigarettes  . Smokeless tobacco: Never Used  . Alcohol Use: 0.6 oz/week     1 Glasses of wine per week     Comment: occasion  . Drug Use: No  . Sexually Active: Yes    Birth Control/ Protection: None   Other Topics Concern  . Not on file   Social History Narrative  . No narrative on file    Family History  Problem Relation Age of Onset  . Hypertension Father   . Thyroid disease Mother   . Diabetes Maternal Grandmother   . Thyroid disease Maternal Grandmother   . Diabetes Maternal Aunt   . Diabetes Maternal Aunt   . Kidney disease Maternal Aunt       Review of Systems   As above. Objective:   Physical Exam  BP 138/90  Pulse 77  Temp(Src) 98.2 F (36.8 C) (Oral)  Resp 18  Ht 5' 9.5" (1.765 m)  Wt 277 lb 9.6 oz (125.919 kg)  BMI 40.42 kg/m2  SpO2 100%  LMP 06/18/2012  General:  Pleasant, obese female.  NAD. Skin:  Increased warmth over LEFT knee.  No rashes.  Increased edema of left knee compared to right knee. MSK:  LEFT knee:  Decreased ROM with flexion and extension.  Positive pain with extension against resistance.  5/5 strength with flexion, extension, plantar/dorsiflexion.  Positive McMurray's.  Negative Lachman's/varus/valgus.  Negative seated leg raise.   DTRs:  1+ patellar reflexes and ankle reflexes.  Peripheral vascular:  Bilateral 2+ dorsalis pedis pulses.  No c/c/e. Heart:  RRR.  Normal S1,S2.  No m/g/r. Lungs:  CTAB. Neuro:  A&Ox3.  Cranial nerves intact.  Sensation intact.  Results for orders placed in visit on 07/08/12  POCT CBC      Result Value Range   WBC 11.2 (*) 4.6 - 10.2 K/uL   Lymph, poc 3.2  0.6 - 3.4   POC LYMPH PERCENT 28.4  10 - 50 %L   MID (cbc) 0.5  0 - 0.9   POC MID % 4.9  0 - 12 %M   POC Granulocyte 7.5 (*) 2 - 6.9   Granulocyte percent 66.7  37 - 80 %G   RBC 4.40  4.04 - 5.48 M/uL   Hemoglobin 13.4  12.2 - 16.2 g/dL   HCT, POC 16.1  09.6 - 47.9 %   MCV 96.2  80 - 97 fL   MCH, POC 30.5  27 - 31.2 pg   MCHC 31.7 (*) 31.8 - 35.4 g/dL   RDW, POC 04.5     Platelet Count, POC 283  142 - 424 K/uL   MPV  8.8  0 - 99.8 fL  POCT SEDIMENTATION RATE      Result Value Range   POCT SED RATE 13  0 - 22 mm/hr  POCT URINE PREGNANCY      Result Value Range   Preg Test, Ur Negative       [facility] reading (PRIMARY) by  Dr. Conley Rolls.  LEFT KNEE:  Negative.      Assessment & Plan:  Knee joint pain, left - Plan: DG Knee Complete 4 Views Left, POCT CBC, POCT SEDIMENTATION RATE, POCT urine pregnancy, meloxicam (MOBIC) 15 MG tablet, HYDROcodone-acetaminophen (NORCO) 5-325 MG per tablet  Patient Instructions  Call and schedule a follow up with Dr. Penni Bombard at [facility] for 2 weeks from now or as soon as possible.  Take Mobic and Ultram as needed for inflammation and pain.  Use the hinged knee brace at work and whenever you do activities.  When you are at home on the couch you can take the brace off, elevate your knee, and ice.  Ice your knee for 15-20 minutes at least twice daily.  If you start to develop fever or you notice increased redness of your knee before you see Dr. Penni Bombard return to the clinic for re-evaluation.

## 2012-07-09 ENCOUNTER — Telehealth: Payer: Self-pay | Admitting: Radiology

## 2012-07-09 ENCOUNTER — Other Ambulatory Visit: Payer: Self-pay | Admitting: Radiology

## 2012-07-09 NOTE — Telephone Encounter (Signed)
Called in hydrocodone to [facility] per [employer]

## 2012-07-09 NOTE — Telephone Encounter (Signed)
[facility] called to [facility].

## 2012-07-12 ENCOUNTER — Emergency Department (HOSPITAL_COMMUNITY)
Admission: EM | Admit: 2012-07-12 | Discharge: 2012-07-12 | Disposition: A | Discharge status: Home / Self Care | Payer: BC Managed Care – PPO | Attending: Emergency Medicine | Admitting: Emergency Medicine

## 2012-07-12 DIAGNOSIS — M25476 Effusion, unspecified foot: Secondary | ICD-10-CM | POA: Insufficient documentation

## 2012-07-12 DIAGNOSIS — F17200 Nicotine dependence, unspecified, uncomplicated: Secondary | ICD-10-CM | POA: Insufficient documentation

## 2012-07-12 DIAGNOSIS — M25569 Pain in unspecified knee: Secondary | ICD-10-CM | POA: Insufficient documentation

## 2012-07-12 DIAGNOSIS — M25473 Effusion, unspecified ankle: Secondary | ICD-10-CM | POA: Insufficient documentation

## 2012-07-12 DIAGNOSIS — M25562 Pain in left knee: Secondary | ICD-10-CM

## 2012-07-12 DIAGNOSIS — M7989 Other specified soft tissue disorders: Secondary | ICD-10-CM

## 2012-07-12 DIAGNOSIS — M25469 Effusion, unspecified knee: Secondary | ICD-10-CM | POA: Insufficient documentation

## 2012-07-12 MED ORDER — ENOXAPARIN SODIUM 150 MG/ML ~~LOC~~ SOLN
1.0000 mg/kg | Freq: Once | SUBCUTANEOUS | Status: AC
  Administered 2012-07-12: 125 mg via SUBCUTANEOUS
  Filled 2012-07-12: qty 1

## 2012-07-12 MED ORDER — HYDROCODONE-ACETAMINOPHEN 5-325 MG PO TABS: 1.0000 | ORAL_TABLET | Freq: Four times a day (QID) | ORAL | Status: DC | PRN

## 2012-07-12 NOTE — ED Notes (Signed)
Pt states she has had L knee pain for the past month. Pt states she has a knot on the lateral side of her L knee. Pt also states her L ankle is swollen since this morning. Pt went to [facility] this past week and had x-rays done of her L knee. They gave pt Mobic and she has taken it the last few days, but states it is not helping her pain. Pt ambulatory to exam room with steady gait. Pt states she drove herself here.

## 2012-07-12 NOTE — ED Provider Notes (Signed)
History    This chart was scribed for non-physician practitioner working with Anne Shi, MD by Anne [name], ED Scribe. This patient was seen in room WTR5/WTR5 and the patient's care was started at 1803.    CSN: [id number]  Arrival date & time 07/12/12  1803   First MD Initiated Contact with Patient 07/12/12 1814      No chief complaint on file.    The history is provided by the patient. No language interpreter was used.    Anne [name] is a 32 y.o. female who presents to the Emergency Department complaining of ongoing, constant, left knee [name] for that has been going on for 1.5 months. Pt states she has a new knot on the lateral side of her left knee. States her left ankle is also swollen since this morning. Pt went to [facility] this past [facility] and had x-rays done of her L knee. Pt states she saw an orthopedist in [place] but has not followed up with them. They gave her Mobic and she has taken it the last few days, but states it is not helping her [name].    Pt is a current everyday smoker and occasional alcohol user.  No past medical history on file.  Past Surgical History  Procedure Laterality Date  . Ectopic pregnancy surgery    . Mandible surgery    . Tubal ligation      Family History  Problem Relation Age of Onset  . Hypertension Father   . Thyroid disease Mother   . Diabetes Maternal Grandmother   . Thyroid disease Maternal Grandmother   . Diabetes Maternal Aunt   . Diabetes Maternal Aunt   . Kidney disease Maternal Aunt     History  Substance Use Topics  . Smoking status: Current Every Day Smoker -- 1.00 packs/day for 10 years    Types: Cigarettes  . Smokeless tobacco: Never Used  . Alcohol Use: 0.6 oz/week    1 Glasses of wine per week     Comment: occasion    OB History   Grav Para Term Preterm Abortions TAB SAB Ect Mult Living                  Review of Systems  Musculoskeletal: Positive for joint swelling and arthralgias.    Allergies  Food  allergy formula  Home Medications   Current Outpatient Rx  Name  Route  Sig  Dispense  Refill  . amoxicillin (AMOXIL) 500 MG capsule   Oral   Take 500 mg by mouth 3 (three) times daily.         Marland Kitchen HYDROcodone-acetaminophen (NORCO) 5-325 MG per tablet   Oral   Take 1 tablet by mouth every 6 (six) hours as needed for [name].   30 tablet   0   . meloxicam (MOBIC) 15 MG tablet   Oral   Take 1 tablet (15 mg total) by mouth daily.   30 tablet   1   . oxyCODONE-acetaminophen (PERCOCET/ROXICET) 5-325 MG per tablet      1 to 2 tabs PO q6hrs  PRN for [name]   15 tablet   0     LMP 06/18/2012  Physical Exam  Nursing note and vitals reviewed. Constitutional: She is oriented to person, place, and time. She appears well-developed and well-nourished. No distress.  HENT:  Head: Normocephalic and atraumatic.  Eyes: EOM are normal.  Neck: Neck supple. No tracheal deviation present.  Cardiovascular: Normal rate.   Pulmonary/Chest:  Effort normal. No respiratory distress.  Musculoskeletal: Normal range of motion. She exhibits tenderness.  Medial and lateral left knee joint tenderness. Some swelling on lateral knee joint.  Tenderness in left calf muscles. Swelling in left ankle with no tenderness.  + Homans' sign - Anterior and posterior drawers test.   No laxity with medial or lateral stress on knee joint.   Neurological: She is alert and oriented to person, place, and time.  Skin: Skin is warm and dry.  Psychiatric: She has a normal mood and affect. Her behavior is normal.    ED Course  Procedures (including critical care time)  DIAGNOSTIC STUDIES: Oxygen Saturation is 100% on room air, normal by my interpretation.    COORDINATION OF CARE: 7:06 PM Discussed treatment plan with pt at bedside and pt agreed to plan.    Labs Reviewed - No data to display No results found.   1. Left knee [name]   2. Swelling of left lower extremity       MDM  L knee [name] for several months.  On exam, pt does have swelling to the left ankle greater than right. She has positive homan's sign and calf tenderness. Concerned about DVT. It is past 7pm, unable to get venous doppler. Will give lovenox injection 1mg /kg and have her return for doppler in AM. Otherwise if negative, follow up with PCP.     I personally performed the services described in this documentation, which was scribed in my presence. The recorded information has been reviewed and is accurate.   Anne Mussel, PA-C 07/13/12 0121

## 2012-07-13 ENCOUNTER — Ambulatory Visit (HOSPITAL_COMMUNITY)
Admission: RE | Admit: 2012-07-13 | Discharge: 2012-07-13 | Disposition: A | Discharge status: Home / Self Care | Payer: BC Managed Care – PPO | Source: Ambulatory Visit | Attending: Emergency Medicine | Admitting: Emergency Medicine

## 2012-07-13 DIAGNOSIS — M7989 Other specified soft tissue disorders: Secondary | ICD-10-CM | POA: Insufficient documentation

## 2012-07-13 DIAGNOSIS — M79609 Pain in unspecified limb: Secondary | ICD-10-CM | POA: Insufficient documentation

## 2012-07-13 NOTE — Progress Notes (Signed)
VASCULAR LAB PRELIMINARY  PRELIMINARY  PRELIMINARY  PRELIMINARY  Left lower extremity venous Doppler completed.    Preliminary report:  There is no DVT or SVT noted in the left lower extremity.  There is fluid noted lateral left knee.  Kurstin Dimarzo, RVT 07/13/2012, 9:21 AM

## 2012-07-14 NOTE — ED Provider Notes (Signed)
Medical screening examination/treatment/procedure(s) were performed by non-physician practitioner and as supervising physician I was immediately available for consultation/collaboration.   Chayna Surratt L Wesam Gearhart, MD 07/14/12 0553 

## 2013-02-10 ENCOUNTER — Encounter (HOSPITAL_COMMUNITY): Payer: Self-pay | Admitting: Emergency Medicine

## 2013-02-10 ENCOUNTER — Emergency Department (HOSPITAL_COMMUNITY): Payer: BC Managed Care – PPO

## 2013-02-10 ENCOUNTER — Emergency Department (HOSPITAL_COMMUNITY)
Admission: EM | Admit: 2013-02-10 | Discharge: 2013-02-10 | Disposition: A | Discharge status: Home / Self Care | Payer: BC Managed Care – PPO | Attending: Emergency Medicine | Admitting: Emergency Medicine

## 2013-02-10 DIAGNOSIS — R0602 Shortness of breath: Secondary | ICD-10-CM | POA: Insufficient documentation

## 2013-02-10 DIAGNOSIS — R05 Cough: Secondary | ICD-10-CM | POA: Insufficient documentation

## 2013-02-10 DIAGNOSIS — R0989 Other specified symptoms and signs involving the circulatory and respiratory systems: Secondary | ICD-10-CM

## 2013-02-10 DIAGNOSIS — R059 Cough, unspecified: Secondary | ICD-10-CM | POA: Insufficient documentation

## 2013-02-10 DIAGNOSIS — J069 Acute upper respiratory infection, unspecified: Secondary | ICD-10-CM | POA: Insufficient documentation

## 2013-02-10 DIAGNOSIS — IMO0002 Reserved for concepts with insufficient information to code with codable children: Secondary | ICD-10-CM | POA: Insufficient documentation

## 2013-02-10 DIAGNOSIS — R0789 Other chest pain: Secondary | ICD-10-CM | POA: Insufficient documentation

## 2013-02-10 DIAGNOSIS — R062 Wheezing: Secondary | ICD-10-CM | POA: Insufficient documentation

## 2013-02-10 DIAGNOSIS — Z79899 Other long term (current) drug therapy: Secondary | ICD-10-CM | POA: Insufficient documentation

## 2013-02-10 DIAGNOSIS — R0982 Postnasal drip: Secondary | ICD-10-CM | POA: Insufficient documentation

## 2013-02-10 DIAGNOSIS — R5381 Other malaise: Secondary | ICD-10-CM | POA: Insufficient documentation

## 2013-02-10 DIAGNOSIS — R6883 Chills (without fever): Secondary | ICD-10-CM | POA: Insufficient documentation

## 2013-02-10 DIAGNOSIS — F17200 Nicotine dependence, unspecified, uncomplicated: Secondary | ICD-10-CM | POA: Insufficient documentation

## 2013-02-10 DIAGNOSIS — R51 Headache: Secondary | ICD-10-CM | POA: Insufficient documentation

## 2013-02-10 MED ORDER — IPRATROPIUM BROMIDE 0.02 % IN SOLN
0.5000 mg | Freq: Once | RESPIRATORY_TRACT | Status: AC
  Administered 2013-02-10: 0.5 mg via RESPIRATORY_TRACT
  Filled 2013-02-10: qty 2.5

## 2013-02-10 MED ORDER — FLUTICASONE PROPIONATE 50 MCG/ACT NA SUSP: 2.0000 | Freq: Every day | NASAL | Status: DC

## 2013-02-10 MED ORDER — ALBUTEROL SULFATE (5 MG/ML) 0.5% IN NEBU
5.0000 mg | INHALATION_SOLUTION | Freq: Once | RESPIRATORY_TRACT | Status: AC
Start: 2013-02-10 — End: 2013-02-10
  Administered 2013-02-10: 5 mg via RESPIRATORY_TRACT
  Filled 2013-02-10: qty 1

## 2013-02-10 MED ORDER — BENZONATATE 100 MG PO CAPS: 100.0000 mg | ORAL_CAPSULE | Freq: Three times a day (TID) | ORAL | Status: DC | PRN

## 2013-02-10 MED ORDER — ALBUTEROL SULFATE HFA 108 (90 BASE) MCG/ACT IN AERS
2.0000 | INHALATION_SPRAY | RESPIRATORY_TRACT | Status: DC | PRN
  Administered 2013-02-10: 2 via RESPIRATORY_TRACT
  Filled 2013-02-10: qty 6.7

## 2013-02-10 MED ORDER — PREDNISONE 20 MG PO TABS
60.0000 mg | ORAL_TABLET | Freq: Once | ORAL | Status: AC
  Administered 2013-02-10: 60 mg via ORAL
  Filled 2013-02-10: qty 3

## 2013-02-10 MED ORDER — GUAIFENESIN ER 600 MG PO TB12: 1200.0000 mg | ORAL_TABLET | Freq: Two times a day (BID) | ORAL | Status: DC

## 2013-02-10 NOTE — ED Notes (Signed)
Pt reports that she has a cough and congestion that started yesterday. Pt reports congestion and productive cough. States she tried OTC medication without much relief. Pt also with a runny nose.

## 2013-02-10 NOTE — ED Provider Notes (Signed)
CSN: [id number]     Arrival date & time 02/10/13  1523 History   This chart was scribed for non-physician practitioner Dierdre Forth, PA-C working with Derwood Kaplan, MD by Clydene Laming, ED Scribe. This patient was seen in room TR09C/TR09C and the patient's care was started at 4:48 PM.   Chief Complaint  Patient presents with  . Cough  . Nasal Congestion    The history is provided by the patient and medical records. No language interpreter was used.   HPI Comments: Anne [name] is a 32 y.o. female who presents to the Emergency Department complaining of a cough onset yesterday with associated chest pain, back pain, nasal congestion. Pt states symptoms worsened today. She reports taking liquid mucinex and applying chest cream without relief. Pt states she is positive for chills, but denies fever, nausea, and vomiting. 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  Pt has associated chest pain with coughing, chest tightness and mild shortness of breath.  Exertion seems to make her symptoms worse and nothing makes it better.Marland Kitchen   History reviewed. No pertinent past medical history. Past Surgical History  Procedure Laterality Date  . Ectopic pregnancy surgery    . Mandible surgery    . Tubal ligation     Family History  Problem Relation Age of Onset  . Hypertension Father   . Thyroid disease Mother   . Diabetes Maternal Grandmother   . Thyroid disease Maternal Grandmother   . Diabetes Maternal Aunt   . Diabetes Maternal Aunt   . Kidney disease Maternal Aunt    History  Substance Use Topics  . Smoking status: Current Every Day Smoker -- 1.00 packs/day for 10 years    Types: Cigarettes  . Smokeless tobacco: Never Used  . Alcohol Use: 0.6 oz/week    1 Glasses of wine per week     Comment: occasion   OB History   Grav Para Term Preterm Abortions TAB SAB Ect Mult Living                 Review of Systems  Constitutional: Positive for chills and  fatigue. Negative for fever and appetite change.  HENT: Positive for congestion, postnasal drip, rhinorrhea and sinus pressure. Negative for ear discharge, ear pain, mouth sores, sneezing and sore throat.   Eyes: Negative for visual disturbance.  Respiratory: Positive for cough, chest tightness, shortness of breath and wheezing. Negative for stridor.   Cardiovascular: Positive for chest pain. Negative for palpitations and leg swelling.  Gastrointestinal: Negative for nausea, vomiting, abdominal pain and diarrhea.  Genitourinary: Negative for dysuria, urgency, frequency and hematuria.  Musculoskeletal: Negative for arthralgias, back pain, myalgias and neck stiffness.  Skin: Negative for rash.  Neurological: Positive for headaches. Negative for syncope, light-headedness and numbness.  Hematological: Negative for adenopathy.  Psychiatric/Behavioral: The patient is not nervous/anxious.   All other systems reviewed and are negative.    Allergies  Food allergy formula  Home Medications   Current Outpatient Rx  Name  Route  Sig  Dispense  Refill  . Camphor-Eucalyptus-Menthol (VICKS VAPORUB EX)   Apply externally   Apply 1 application topically daily as needed (for congestion).         . Phenylephrine-DM-GG (MUCINEX FAST-MAX CONGEST COUGH) 2.5-5-100 MG/5ML LIQD   Oral   Take 20 mLs by mouth daily as needed (for cough and cold).         . benzonatate (TESSALON PERLES) 100 MG capsule  Oral   Take 1 capsule (100 mg total) by mouth 3 (three) times daily as needed for cough (cough).   20 capsule   0   . fluticasone (FLONASE) 50 MCG/ACT nasal spray   Nasal   Place 2 sprays into the nose daily.   16 g   2   . guaiFENesin (MUCINEX) 600 MG 12 hr tablet   Oral   Take 2 tablets (1,200 mg total) by mouth 2 (two) times daily.   40 tablet   0    Triage Vitals:BP 175/100  Pulse 92  Temp(Src) 97.7 F (36.5 C) (Oral)  Resp 18  SpO2 100% Physical Exam  Constitutional: She is  oriented to person, place, and time. She appears well-developed and well-nourished. No distress.  HENT:  Head: Normocephalic and atraumatic.  Right Ear: Tympanic membrane, external ear and ear canal normal.  Left Ear: Tympanic membrane, external ear and ear canal normal.  Nose: Mucosal edema and rhinorrhea present. No epistaxis. Right sinus exhibits no maxillary sinus tenderness and no frontal sinus tenderness. Left sinus exhibits no maxillary sinus tenderness and no frontal sinus tenderness.  Mouth/Throat: Uvula is midline and mucous membranes are normal. Mucous membranes are not pale and not cyanotic. No oropharyngeal exudate, posterior oropharyngeal edema, posterior oropharyngeal erythema or tonsillar abscesses.  Eyes: Conjunctivae are normal. Pupils are equal, round, and reactive to light.  Neck: Normal range of motion and full passive range of motion without pain.  Cardiovascular: Normal rate, normal heart sounds and intact distal pulses.   No murmur heard. Pulmonary/Chest: Effort normal. No accessory muscle usage or stridor. Not tachypneic. No respiratory distress. She has no decreased breath sounds. She has wheezes. She has rhonchi. She has no rales. She exhibits tenderness (mild, generalized, throughout). She exhibits no bony tenderness.  Abdominal: Soft. Bowel sounds are normal. She exhibits no distension. There is no tenderness.  Musculoskeletal: Normal range of motion. She exhibits no edema.  Lymphadenopathy:    She has no cervical adenopathy.  Neurological: She is alert and oriented to person, place, and time. She exhibits normal muscle tone. Coordination normal.  Skin: Skin is warm and dry. No rash noted. She is not diaphoretic. No erythema.  Psychiatric: She has a normal mood and affect. Her behavior is normal.    ED Course  Procedures (including critical care time) DIAGNOSTIC STUDIES: Oxygen Saturation is 100% on RA, normal by my interpretation.    COORDINATION OF CARE: 4:55  PM- Discussed treatment plan with pt at bedside. Pt verbalized understanding and agreement with plan.   Labs Review Labs Reviewed - No data to display Imaging Review Dg Chest 2 View (if Patient Has Fever And/or Copd)  02/10/2013   CLINICAL DATA:  Cough, chest pain, shortness of breath  EXAM: CHEST  2 VIEW  COMPARISON:  None.  FINDINGS: The heart size and mediastinal contours are within normal limits. No acute infiltrate or pulmonary edema. Bony thorax is unremarkable.  IMPRESSION: No active cardiopulmonary disease.   Electronically Signed   By: Nathasha Mead M.D.   On: 02/10/2013 16:40    EKG Interpretation   None       MDM   1. Viral URI with cough   2. Wheezing   3. Rhonchi      Anne [name] presents with uri symptoms and wheezes and rhonchi.  Patient given albuterol neb x2 and prednisone here in the emergency department with resolution of wheezing but persistent rhonchi.    Pt CXR negative for acute  infiltrate. I personally reviewed the imaging tests through PACS system.  I reviewed available ER/hospitalization records through the EMR.  Patients symptoms are consistent with URI, likely viral etiology. Discussed that antibiotics are not indicated for viral infections. Pt will be discharged with symptomatic treatment including albuterol inhaler.  Verbalizes understanding and is agreeable with plan. Pt is hemodynamically stable & in NAD prior to dc.  It has been determined that no acute conditions requiring further emergency intervention are present at this time. The patient/guardian have been advised of the diagnosis and plan. We have discussed signs and symptoms that warrant return to the ED, such as changes or worsening in symptoms.   Vital signs are stable at discharge. Patient tachycardic after nebulizer treatment but breathing much better.  BP 121/66  Pulse 101  Temp(Src) 97.7 F (36.5 C) (Oral)  Resp 16  SpO2 100%  LMP 02/10/2013  Patient/guardian has voiced  understanding and agreed to follow-up with the PCP or specialist.         Dierdre Forth, PA-C 02/10/13 1840

## 2013-02-13 NOTE — ED Provider Notes (Signed)
Medical screening examination/treatment/procedure(s) were performed by non-physician practitioner and as supervising physician I was immediately available for consultation/collaboration.  Eriel Doyon, MD 02/13/13 0751 

## 2013-03-07 ENCOUNTER — Emergency Department (HOSPITAL_COMMUNITY)
Admission: EM | Admit: 2013-03-07 | Discharge: 2013-03-08 | Disposition: A | Discharge status: Home / Self Care | Payer: BC Managed Care – PPO | Attending: Emergency Medicine | Admitting: Emergency Medicine

## 2013-03-07 ENCOUNTER — Emergency Department (HOSPITAL_COMMUNITY): Payer: BC Managed Care – PPO

## 2013-03-07 ENCOUNTER — Encounter (HOSPITAL_COMMUNITY): Payer: Self-pay | Admitting: Emergency Medicine

## 2013-03-07 DIAGNOSIS — Z79899 Other long term (current) drug therapy: Secondary | ICD-10-CM | POA: Insufficient documentation

## 2013-03-07 DIAGNOSIS — R011 Cardiac murmur, unspecified: Secondary | ICD-10-CM | POA: Insufficient documentation

## 2013-03-07 DIAGNOSIS — J069 Acute upper respiratory infection, unspecified: Secondary | ICD-10-CM

## 2013-03-07 DIAGNOSIS — F17200 Nicotine dependence, unspecified, uncomplicated: Secondary | ICD-10-CM | POA: Insufficient documentation

## 2013-03-07 LAB — COMPREHENSIVE METABOLIC PANEL
ALT: 7 U/L (ref 0–35)
AST: 16 U/L (ref 0–37)
Albumin: 3.9 g/dL (ref 3.5–5.2)
Alkaline Phosphatase: 71 U/L (ref 39–117)
Chloride: 105 mEq/L (ref 96–112)
Creatinine, Ser: 0.87 mg/dL (ref 0.50–1.10)
Potassium: 4 mEq/L (ref 3.5–5.1)
Sodium: 137 mEq/L (ref 135–145)
Total Bilirubin: 0.2 mg/dL — ABNORMAL LOW (ref 0.3–1.2)

## 2013-03-07 LAB — CBC WITH DIFFERENTIAL/PLATELET
Basophils Absolute: 0 10*3/uL (ref 0.0–0.1)
Basophils Relative: 0 % (ref 0–1)
Lymphocytes Relative: 39 % (ref 12–46)
MCHC: 34.2 g/dL (ref 30.0–36.0)
Neutro Abs: 4.5 10*3/uL (ref 1.7–7.7)
Neutrophils Relative %: 52 % (ref 43–77)
Platelets: 259 10*3/uL (ref 150–400)
RDW: 13.3 % (ref 11.5–15.5)
WBC: 8.7 10*3/uL (ref 4.0–10.5)

## 2013-03-07 NOTE — ED Notes (Signed)
The pt is c/o chest tightness with congestion and sob for 3 weeks.  She was seen here 10-21 and diagnosed with bronchitis but she is no better.  Productive cough green thick sputum.  No distress.  lmp last month

## 2013-03-07 NOTE — ED Notes (Signed)
Reports she was seen here 3 weeks ago, dx bronchitis.  Took inhaler and meds with improvement, then became worse 3-4 days ago as if relapsing.

## 2013-03-08 MED ORDER — HYDROCODONE-ACETAMINOPHEN 7.5-325 MG/15ML PO SOLN: 15.0000 mL | Freq: Four times a day (QID) | ORAL | Status: DC | PRN

## 2013-03-08 NOTE — ED Provider Notes (Signed)
CSN: [id number]     Arrival date & time 03/07/13  2039 History   First MD Initiated Contact with Patient 03/08/13 0002     Chief Complaint  Patient presents with  . Chest Pain   (Consider location/radiation/quality/duration/timing/severity/associated sxs/prior Treatment) HPI Comments: The patient is a 32  year-old female presenting the Emergency Department with a chief complaint of Chest discomfort for several weeks.  She reports the discomfort as tightness that is aggravated by deep inspiration. She reports associated productive cough, rhinorrhea, sinus congestion.  She reports she got over her URI from 02/10/2013 but after a few days had more chest congestion.  She denies fever or chills, abdominal pain, nausea, vomiting, diarrhea, constipation.   Patient is a 32 y.o. female presenting with chest pain. The history is provided by the patient.  Chest Pain   History reviewed. No pertinent past medical history. Past Surgical History  Procedure Laterality Date  . Ectopic pregnancy surgery    . Mandible surgery    . Tubal ligation     Family History  Problem Relation Age of Onset  . Hypertension Father   . Thyroid disease Mother   . Diabetes Maternal Grandmother   . Thyroid disease Maternal Grandmother   . Diabetes Maternal Aunt   . Diabetes Maternal Aunt   . Kidney disease Maternal Aunt    History  Substance Use Topics  . Smoking status: Current Every Day Smoker -- 1.00 packs/day for 10 years    Types: Cigarettes  . Smokeless tobacco: Never Used  . Alcohol Use: 0.6 oz/week    1 Glasses of wine per week     Comment: occasion   OB History   Grav Para Term Preterm Abortions TAB SAB Ect Mult Living                 Review of Systems  Cardiovascular: Positive for chest pain.  All other systems reviewed and are negative.    Allergies  Food allergy formula  Home Medications   Current Outpatient Rx  Name  Route  Sig  Dispense  Refill  . albuterol (PROVENTIL  HFA;VENTOLIN HFA) 108 (90 BASE) MCG/ACT inhaler   Inhalation   Inhale 2 puffs into the lungs every 6 (six) hours as needed for wheezing or shortness of breath.         . benzonatate (TESSALON PERLES) 100 MG capsule   Oral   Take 1 capsule (100 mg total) by mouth 3 (three) times daily as needed for cough (cough).   20 capsule   0   . Camphor-Eucalyptus-Menthol (VICKS VAPORUB EX)   Apply externally   Apply 1 application topically daily as needed (for congestion).         . fluticasone (FLONASE) 50 MCG/ACT nasal spray   Nasal   Place 2 sprays into the nose daily.   16 g   2   . guaiFENesin (MUCINEX) 600 MG 12 hr tablet   Oral   Take 2 tablets (1,200 mg total) by mouth 2 (two) times daily.   40 tablet   0   . Phenylephrine-DM-GG (MUCINEX FAST-MAX CONGEST COUGH) 2.5-5-100 MG/5ML LIQD   Oral   Take 20 mLs by mouth daily as needed (for cough and cold).          BP 153/94  Pulse 88  Temp(Src) 98.7 F (37.1 C) (Oral)  Resp 20  Ht 5\' 11"  (1.803 m)  Wt 277 lb (125.646 kg)  BMI 38.65 kg/m2  SpO2 96%  LMP  02/10/2013 Physical Exam  Nursing note and vitals reviewed. Constitutional: She appears well-developed and well-nourished.  HENT:  Head: Normocephalic and atraumatic.  Right Ear: Tympanic membrane normal. Tympanic membrane is not erythematous and not bulging.  Left Ear: Tympanic membrane normal. Tympanic membrane is not erythematous and not bulging.  Nose: Rhinorrhea present. Right sinus exhibits maxillary sinus tenderness. Left sinus exhibits maxillary sinus tenderness.  Mouth/Throat: Uvula is midline and mucous membranes are normal. Mucous membranes are not dry. Posterior oropharyngeal erythema present. No oropharyngeal exudate or posterior oropharyngeal edema.  Eyes: Pupils are equal, round, and reactive to light. Right eye exhibits no discharge. Left eye exhibits no discharge. No scleral icterus.  Neck: Normal range of motion. Neck supple.  Cardiovascular: Normal  rate and regular rhythm.   Murmur heard. Pulmonary/Chest: Effort normal and breath sounds normal. No stridor. No respiratory distress. She has no decreased breath sounds. She has no wheezes. She has no rales. She exhibits tenderness. She exhibits no retraction.    Abdominal: Soft. Bowel sounds are normal. She exhibits no distension. There is no tenderness. There is no rebound.  Musculoskeletal: Normal range of motion. She exhibits no edema.  Lymphadenopathy:       Head (right side): No occipital adenopathy present.       Head (left side): No occipital adenopathy present.    She has no cervical adenopathy.       Right cervical: No superficial cervical, no deep cervical and no posterior cervical adenopathy present.      Left cervical: No superficial cervical, no deep cervical and no posterior cervical adenopathy present.  Neurological: She is alert.  Skin: Skin is warm and dry.    ED Course  Procedures (including critical care time) Labs Review Labs Reviewed  COMPREHENSIVE METABOLIC PANEL - Abnormal; Notable for the following:    Total Bilirubin 0.2 (*)    GFR calc non Af Amer 86 (*)    All other components within normal limits  CBC WITH DIFFERENTIAL   Imaging Review Dg Chest 2 View  03/07/2013   CLINICAL DATA:  Chest pain and shortness of breath for 3 days, history of smoking and bronchitis  EXAM: CHEST  2 VIEW  COMPARISON:  02/10/2013  FINDINGS: Grossly unchanged cardiac silhouette and mediastinal contours. The lungs remain hyperexpanded with mild diffuse slightly nodular thickening of the pulmonary interstitium. No focal airspace opacities. No pleural effusion or pneumothorax. No evidence of edema. No acute osseus abnormalities.  IMPRESSION: Mild lung hyperexpansion of bronchitic change without acute cardiopulmonary disease.   Electronically Signed   By: Simonne Come M.D.   On: 03/07/2013 21:39    EKG Interpretation   None       MDM   1. Upper respiratory virus    Patient  was recently evaluated in the ED with similar complaints.  She remains afebrile, no leucocytosis today.  On exam she has signs of upper respiratory infection of viral etiology.  No wheezing or rhonchi heard on exam.  Will treat the pt symptomatically. Discussed lab results, imagine results, and treatment plan with the patient.  She reports understanding and no other concerns at this time.    Patient is stable for discharge at this time.  Meds given in ED:  Medications - No data to display  Discharge Medication List as of 03/08/2013 12:19 AM    START taking these medications   Details  HYDROcodone-acetaminophen (HYCET) 7.5-325 mg/15 ml solution Take 15 mLs by mouth 4 (four) times daily as needed for  moderate pain., Starting 03/08/2013, Until Mon 03/08/14, Print             Clabe Seal, PA-C 03/10/13 1901

## 2013-03-11 NOTE — ED Provider Notes (Signed)
Medical screening examination/treatment/procedure(s) were performed by non-physician practitioner and as supervising physician I was immediately available for consultation/collaboration.  EKG Interpretation    Date/Time:    Ventricular Rate:  85 PR Interval:  116 QRS Duration: 86 QT Interval:  378 QTC Calculation: 449 R Axis:   61 Text Interpretation:  Normal sinus rhythm Normal ECG ED PHYSICIAN INTERPRETATION AVAILABLE IN [name]             Sunnie Nielsen, MD 03/11/13 [phone]

## 2013-06-29 ENCOUNTER — Emergency Department (HOSPITAL_COMMUNITY)
Admission: EM | Admit: 2013-06-29 | Discharge: 2013-06-29 | Disposition: A | Discharge status: Home / Self Care | Payer: BC Managed Care – PPO | Attending: Emergency Medicine | Admitting: Emergency Medicine

## 2013-06-29 ENCOUNTER — Emergency Department (HOSPITAL_COMMUNITY): Payer: BC Managed Care – PPO

## 2013-06-29 ENCOUNTER — Encounter (HOSPITAL_COMMUNITY): Payer: Self-pay | Admitting: Emergency Medicine

## 2013-06-29 DIAGNOSIS — J209 Acute bronchitis, unspecified: Secondary | ICD-10-CM | POA: Insufficient documentation

## 2013-06-29 DIAGNOSIS — Z79899 Other long term (current) drug therapy: Secondary | ICD-10-CM | POA: Insufficient documentation

## 2013-06-29 DIAGNOSIS — J40 Bronchitis, not specified as acute or chronic: Secondary | ICD-10-CM

## 2013-06-29 DIAGNOSIS — F17200 Nicotine dependence, unspecified, uncomplicated: Secondary | ICD-10-CM | POA: Insufficient documentation

## 2013-06-29 LAB — CBC
HCT: 40.1 % (ref 36.0–46.0)
HEMOGLOBIN: 13.9 g/dL (ref 12.0–15.0)
MCH: 31.6 pg (ref 26.0–34.0)
MCHC: 34.7 g/dL (ref 30.0–36.0)
MCV: 91.1 fL (ref 78.0–100.0)
Platelets: 228 10*3/uL (ref 150–400)
RBC: 4.4 MIL/uL (ref 3.87–5.11)
RDW: 13.1 % (ref 11.5–15.5)
WBC: 5.2 10*3/uL (ref 4.0–10.5)

## 2013-06-29 LAB — BASIC METABOLIC PANEL
BUN: 10 mg/dL (ref 6–23)
CO2: 19 mEq/L (ref 19–32)
Calcium: 9 mg/dL (ref 8.4–10.5)
Chloride: 106 mEq/L (ref 96–112)
Creatinine, Ser: 0.92 mg/dL (ref 0.50–1.10)
GFR calc Af Amer: >90 mL/min (ref >90)
GFR, EST NON AFRICAN AMERICAN: 80 mL/min — AB (ref >90)
Glucose, Bld: 90 mg/dL (ref 70–99)
Potassium: 3.5 mEq/L — ABNORMAL LOW (ref 3.7–5.3)
SODIUM: 141 meq/L (ref 137–147)

## 2013-06-29 LAB — I-STAT TROPONIN, ED: Troponin i, poc: 0 ng/mL (ref 0.00–0.08)

## 2013-06-29 MED ORDER — ALBUTEROL SULFATE HFA 108 (90 BASE) MCG/ACT IN AERS
2.0000 | INHALATION_SPRAY | RESPIRATORY_TRACT | Status: DC | PRN
  Administered 2013-06-29: 2 via RESPIRATORY_TRACT
  Filled 2013-06-29: qty 6.7

## 2013-06-29 MED ORDER — PREDNISONE 10 MG PO TABS: 20.0000 mg | ORAL_TABLET | Freq: Every day | ORAL | Status: DC

## 2013-06-29 MED ORDER — MELOXICAM 15 MG PO TABS
15.0000 mg | ORAL_TABLET | Freq: Every day | ORAL | Status: DC
Start: 2013-06-29 — End: 2013-08-03

## 2013-06-29 MED ORDER — GUAIFENESIN ER 600 MG PO TB12: 600.0000 mg | ORAL_TABLET | Freq: Two times a day (BID) | ORAL | Status: DC

## 2013-06-29 MED ORDER — PREDNISONE 20 MG PO TABS
60.0000 mg | ORAL_TABLET | Freq: Once | ORAL | Status: AC
  Administered 2013-06-29: 60 mg via ORAL
  Filled 2013-06-29: qty 3

## 2013-06-29 MED ORDER — CETIRIZINE-PSEUDOEPHEDRINE ER 5-120 MG PO TB12: 1.0000 | ORAL_TABLET | Freq: Two times a day (BID) | ORAL | Status: DC

## 2013-06-29 MED ORDER — AZITHROMYCIN 250 MG PO TABS: 250.0000 mg | ORAL_TABLET | Freq: Every day | ORAL | Status: DC

## 2013-06-29 NOTE — ED Notes (Signed)
Pt comfortable with discharge and follow up instructions. Prescriptions x5.

## 2013-06-29 NOTE — ED Provider Notes (Signed)
Medical screening examination/treatment/procedure(s) were performed by non-physician practitioner and as supervising physician I was immediately available for consultation/collaboration.   EKG Interpretation   Date/Time:  Monday June 29 2013 18:33:56 EDT Ventricular Rate:  94 PR Interval:  114 QRS Duration: 84 QT Interval:  352 QTC Calculation: 440 R Axis:   73 Text Interpretation:  Normal sinus rhythm Right atrial enlargement  Borderline ECG No significant change since last tracing Confirmed by  Rubin PayorPICKERING  MD, Yusuke Beza [phone]([id number]) on 06/29/2013 11:31:45 PM       Juliet RudeNathan R. Rubin PayorPickering, MD 06/29/13 [id number]

## 2013-06-29 NOTE — ED Provider Notes (Signed)
CSN: [id number]     Arrival date & time 06/29/13  1830 History   First MD Initiated Contact with Patient 06/29/13 2039     Chief Complaint  Patient presents with  . Chest Pain   HPI  History provided by the patient. Patient is a 33 year old female with no significant PMH who presents with complaints of persistent productive cough or with worsened chest and back pain and discomfort. Patient states she first began having nasal congestion, right areola and cough that is productive of white-yellow phlegm one week ago. Her coughing does seem worse at nighttime. Over the past several days she has had worsened chest and upper back soreness related to coughing and occasionally with deep breathing. Denies any hemoptysis. She has been using an albuterol inhaler from previous diagnoses of bronchitis which she reports does help temporarily. Her symptoms are worse at night and sometimes a cause nightly awakening. Patient is a daily smoker. She denies any recent long travel. No pain or swelling in the extremities. No history of DVT or PE. Does not use estrogen or birth control. No increased pain with activity or exertion. Denies any shortness of breath. No fever. Does report some chills. No other aggravating or alleviating factors. No other associated symptoms.    History reviewed. No pertinent past medical history. Past Surgical History  Procedure Laterality Date  . Ectopic pregnancy surgery    . Mandible surgery    . Tubal ligation     Family History  Problem Relation Age of Onset  . Hypertension Father   . Thyroid disease Mother   . Diabetes Maternal Grandmother   . Thyroid disease Maternal Grandmother   . Diabetes Maternal Aunt   . Diabetes Maternal Aunt   . Kidney disease Maternal Aunt    History  Substance Use Topics  . Smoking status: Current Every Day Smoker -- 1.00 packs/day for 10 years    Types: Cigarettes  . Smokeless tobacco: Never Used  . Alcohol Use: 0.6 oz/week    1 Glasses of  wine per week     Comment: occasion   OB History   Grav Para Term Preterm Abortions TAB SAB Ect Mult Living                 Review of Systems  Constitutional: Positive for chills and fatigue. Negative for fever and appetite change.  HENT: Positive for congestion and rhinorrhea.   Respiratory: Positive for cough and wheezing. Negative for shortness of breath.   Cardiovascular: Positive for chest pain. Negative for palpitations and leg swelling.  Gastrointestinal: Negative for vomiting and diarrhea.  All other systems reviewed and are negative.      Allergies  Food allergy formula  Home Medications   Current Outpatient Rx  Name  Route  Sig  Dispense  Refill  . albuterol (PROVENTIL HFA;VENTOLIN HFA) 108 (90 BASE) MCG/ACT inhaler   Inhalation   Inhale 2 puffs into the lungs every 6 (six) hours as needed for wheezing or shortness of breath.         . guaiFENesin (MUCINEX) 600 MG 12 hr tablet   Oral   Take 1,200 mg by mouth 2 (two) times daily as needed for cough or to loosen phlegm.         Marland Kitchen Phenylephrine-Pheniramine-DM (THERAFLU COLD & COUGH) 02-10-19 MG PACK   Oral   Take 1 Package by mouth daily as needed (for cold).          BP 159/100  Pulse 85  Temp(Src) 98.8 F (37.1 C)  Resp 16  SpO2 100%  LMP 06/05/2013 Physical Exam  Nursing note and vitals reviewed. Constitutional: She is oriented to person, place, and time. She appears well-developed and well-nourished. No distress.  HENT:  Head: Normocephalic.  Nose: Rhinorrhea present.  Mouth/Throat: Oropharynx is clear and moist.  Neck: Normal range of motion. Neck supple.  No meningeal signs  Cardiovascular: Normal rate and regular rhythm.   Pulmonary/Chest: Effort normal and breath sounds normal. No stridor. No respiratory distress. She has no wheezes. She has no rales.  Abdominal: Soft. There is no tenderness. There is no rebound and no guarding.  Musculoskeletal: Normal range of motion. She exhibits no  edema and no tenderness.  No clinical signs concerning for DVT  Neurological: She is alert and oriented to person, place, and time.  Skin: Skin is warm and dry. No rash noted.  Psychiatric: She has a normal mood and affect. Her behavior is normal.    ED Course  Procedures   DIAGNOSTIC STUDIES: Oxygen Saturation is 100% on room air.    COORDINATION OF CARE:  Nursing notes reviewed. Vital signs reviewed. Initial pt interview and examination performed.   8:40 PM patient seen and evaluated. She appears well no acute distress. Normal respirations and O2 sats. Occasional cough. Patient was unremarkable lab tests, EKG and chest x-ray. She is a smoker but has no history of hypertension, hypercholesterolemia or diabetes.     Treatment plan initiated: Medications  predniSONE (DELTASONE) tablet 60 mg (60 mg Oral Given 06/29/13 2115)   Results for orders placed during the hospital encounter of 06/29/13  CBC      Result Value Ref Range   WBC 5.2  4.0 - 10.5 K/uL   RBC 4.40  3.87 - 5.11 MIL/uL   Hemoglobin 13.9  12.0 - 15.0 g/dL   HCT 16.140.1  09.636.0 - 04.546.0 %   MCV 91.1  78.0 - 100.0 fL   MCH 31.6  26.0 - 34.0 pg   MCHC 34.7  30.0 - 36.0 g/dL   RDW 40.913.1  81.111.5 - 91.415.5 %   Platelets 228  150 - 400 K/uL  BASIC METABOLIC PANEL      Result Value Ref Range   Sodium 141  137 - 147 mEq/L   Potassium 3.5 (*) 3.7 - 5.3 mEq/L   Chloride 106  96 - 112 mEq/L   CO2 19  19 - 32 mEq/L   Glucose, Bld 90  70 - 99 mg/dL   BUN 10  6 - 23 mg/dL   Creatinine, Ser 7.820.92  0.50 - 1.10 mg/dL   Calcium 9.0  8.4 - 95.610.5 mg/dL   GFR calc non Af Amer 80 (*) >90 mL/min   GFR calc Af Amer >90  >90 mL/min  I-STAT TROPOININ, ED      Result Value Ref Range   Troponin i, poc 0.00  0.00 - 0.08 ng/mL   Comment 3              Imaging Review Dg Chest 2 View  06/29/2013   CLINICAL DATA:  Centralized chest pain. Cough, congestion for 1 week. History of heart murmur, bronchitis, and pneumonia.  EXAM: CHEST  2 VIEW  COMPARISON:   03/07/2013  FINDINGS: Heart size is normal. The lungs are clear. No pulmonary edema. No focal consolidations. Visualized osseous structures have a normal appearance.  IMPRESSION: No active cardiopulmonary disease.   Electronically Signed   By: Rosalie GumsBeth  Brown M.D.   On:  06/29/2013 18:54     EKG Interpretation None      Date: 06/29/2013  Rate: 94  Rhythm: normal sinus rhythm  QRS Axis: normal  Intervals: normal  ST/T Wave abnormalities: normal  Conduction Disutrbances:none  Narrative Interpretation: Borderline right atrial enlargement  Old EKG Reviewed: none available    MDM   Final diagnoses:  Bronchitis        Angus Seller, PA-C 06/29/13 2151

## 2013-06-29 NOTE — ED Notes (Signed)
Per pt sts 1 week of chest pain with productive cough and chest tightness. sts has been using albuterol with some relief.

## 2013-06-29 NOTE — Discharge Instructions (Signed)
Your lab testing, EKG of your heart and chest x-ray did not show any signs for a concerning or emergent cause of your continued cough with chest and back discomfort. Please use the medications prescribed to help with your symptoms. Please followup with a primary care provider for continued evaluation and treatment. Return at any time for changing or worsening symptoms.    Bronchitis Bronchitis is inflammation of the airways that extend from the windpipe into the lungs (bronchi). The inflammation often causes mucus to develop, which leads to a cough. If the inflammation becomes severe, it may cause shortness of breath. CAUSES  Bronchitis may be caused by:   Viral infections.   Bacteria.   Cigarette smoke.   Allergens, pollutants, and other irritants.  SIGNS AND SYMPTOMS  The most common symptom of bronchitis is a frequent cough that produces mucus. Other symptoms include:  Fever.   Body aches.   Chest congestion.   Chills.   Shortness of breath.   Sore throat.  DIAGNOSIS  Bronchitis is usually diagnosed through a medical history and physical exam. Tests, such as chest X-rays, are sometimes done to rule out other conditions.  TREATMENT  You may need to avoid contact with whatever caused the problem (smoking, for example). Medicines are sometimes needed. These may include:  Antibiotics. These may be prescribed if the condition is caused by bacteria.  Cough suppressants. These may be prescribed for relief of cough symptoms.   Inhaled medicines. These may be prescribed to help open your airways and make it easier for you to breathe.   Steroid medicines. These may be prescribed for those with recurrent (chronic) bronchitis. HOME CARE INSTRUCTIONS  Get plenty of rest.   Drink enough fluids to keep your urine clear or pale yellow (unless you have a medical condition that requires fluid restriction). Increasing fluids may help thin your secretions and will prevent  dehydration.   Only take over-the-counter or prescription medicines as directed by your health care provider.  Only take antibiotics as directed. Make sure you finish them even if you start to feel better.  Avoid secondhand smoke, irritating chemicals, and strong fumes. These will make bronchitis worse. If you are a smoker, quit smoking. Consider using nicotine gum or skin patches to help control withdrawal symptoms. Quitting smoking will help your lungs heal faster.   Put a cool-mist humidifier in your bedroom at night to moisten the air. This may help loosen mucus. Change the water in the humidifier daily. You can also run the hot water in your shower and sit in the bathroom with the door closed for 5 10 minutes.   Follow up with your health care provider as directed.   Wash your hands frequently to avoid catching bronchitis again or spreading an infection to others.  SEEK MEDICAL CARE IF: Your symptoms do not improve after 1 week of treatment.  SEEK IMMEDIATE MEDICAL CARE IF:  Your fever increases.  You have chills.   You have chest pain.   You have worsening shortness of breath.   You have bloody sputum.  You faint.  You have lightheadedness.  You have a severe headache.   You vomit repeatedly. MAKE SURE YOU:   Understand these instructions.  Will watch your condition.  Will get help right away if you are not doing well or get worse. Document Released: 04/09/2005 Document Revised: 01/28/2013 Document Reviewed: 12/02/2012 [facility] Patient Information 2014 [employer] [place].

## 2013-08-03 ENCOUNTER — Telehealth (HOSPITAL_BASED_OUTPATIENT_CLINIC_OR_DEPARTMENT_OTHER): Payer: Self-pay

## 2013-08-03 ENCOUNTER — Encounter (HOSPITAL_COMMUNITY): Payer: Self-pay | Admitting: Emergency Medicine

## 2013-08-03 ENCOUNTER — Emergency Department (HOSPITAL_COMMUNITY)
Admission: EM | Admit: 2013-08-03 | Discharge: 2013-08-03 | Disposition: A | Discharge status: Home / Self Care | Payer: BC Managed Care – PPO | Attending: Emergency Medicine | Admitting: Emergency Medicine

## 2013-08-03 ENCOUNTER — Emergency Department (HOSPITAL_COMMUNITY)
Admission: EM | Admit: 2013-08-03 | Discharge: 2013-08-03 | Discharge status: Against Medical Advice | Payer: BC Managed Care – PPO | Source: Home / Self Care

## 2013-08-03 DIAGNOSIS — K029 Dental caries, unspecified: Secondary | ICD-10-CM | POA: Insufficient documentation

## 2013-08-03 DIAGNOSIS — Z7982 Long term (current) use of aspirin: Secondary | ICD-10-CM | POA: Insufficient documentation

## 2013-08-03 DIAGNOSIS — F17200 Nicotine dependence, unspecified, uncomplicated: Secondary | ICD-10-CM | POA: Insufficient documentation

## 2013-08-03 DIAGNOSIS — K089 Disorder of teeth and supporting structures, unspecified: Secondary | ICD-10-CM | POA: Insufficient documentation

## 2013-08-03 DIAGNOSIS — Z79899 Other long term (current) drug therapy: Secondary | ICD-10-CM | POA: Insufficient documentation

## 2013-08-03 DIAGNOSIS — K002 Abnormalities of size and form of teeth: Secondary | ICD-10-CM | POA: Insufficient documentation

## 2013-08-03 DIAGNOSIS — K047 Periapical abscess without sinus: Secondary | ICD-10-CM | POA: Insufficient documentation

## 2013-08-03 DIAGNOSIS — Z791 Long term (current) use of non-steroidal anti-inflammatories (NSAID): Secondary | ICD-10-CM | POA: Insufficient documentation

## 2013-08-03 MED ORDER — HYDROCODONE-ACETAMINOPHEN 5-325 MG PO TABS: 1.0000 | ORAL_TABLET | ORAL | Status: DC | PRN

## 2013-08-03 MED ORDER — PENICILLIN V POTASSIUM 500 MG PO TABS: 500.0000 mg | ORAL_TABLET | Freq: Four times a day (QID) | ORAL | Status: AC

## 2013-08-03 MED ORDER — NAPROXEN 500 MG PO TABS
500.0000 mg | ORAL_TABLET | Freq: Two times a day (BID) | ORAL | Status: DC
Start: 2013-08-03 — End: 2013-11-25

## 2013-08-03 NOTE — ED Notes (Signed)
No response in waiting area. 

## 2013-08-03 NOTE — ED Notes (Signed)
Pt at [facility] ED.  

## 2013-08-03 NOTE — Discharge Instructions (Signed)
Take penicillin as prescribed for infection and naproxen for pain. You may take Norco for severe pain. Follow up with a dentist in 24-48 hours. Return if symptoms worsen such as if you are unable to open your jaw, unable to swallow, or develop a fever.  Dental Abscess A dental abscess is a collection of infected fluid (pus) from a bacterial infection in the inner part of the tooth (pulp). It usually occurs at the end of the tooth's root.  CAUSES   Severe tooth decay.  Trauma to the tooth that allows bacteria to enter into the pulp, such as a broken or chipped tooth. SYMPTOMS   Severe pain in and around the infected tooth.  Swelling and redness around the abscessed tooth or in the mouth or face.  Tenderness.  Pus drainage.  Bad breath.  Bitter taste in the mouth.  Difficulty swallowing.  Difficulty opening the mouth.  Nausea.  Vomiting.  Chills.  Swollen neck glands. DIAGNOSIS   A medical and dental history will be taken.  An examination will be performed by tapping on the abscessed tooth.  X-rays may be taken of the tooth to identify the abscess. TREATMENT The goal of treatment is to eliminate the infection. You may be prescribed antibiotic medicine to stop the infection from spreading. A root canal may be performed to save the tooth. If the tooth cannot be saved, it may be pulled (extracted) and the abscess may be drained.  HOME CARE INSTRUCTIONS  Only take over-the-counter or prescription medicines for pain, fever, or discomfort as directed by your caregiver.  Rinse your mouth (gargle) often with salt water ( tsp salt in 8 oz [250 ml] of warm water) to relieve pain or swelling.  Do not drive after taking pain medicine (narcotics).  Do not apply heat to the outside of your face.  Return to your dentist for further treatment as directed. SEEK MEDICAL CARE IF:  Your pain is not helped by medicine.  Your pain is getting worse instead of better. SEEK IMMEDIATE  MEDICAL CARE IF:  You have a fever or persistent symptoms for more than 2 3 days.  You have a fever and your symptoms suddenly get worse.  You have chills or a very bad headache.  You have problems breathing or swallowing.  You have trouble opening your mouth.  You have swelling in the neck or around the eye. Document Released: 04/09/2005 Document Revised: 01/02/2012 Document Reviewed: 07/18/2010 [facility] Patient Information 2014 [employer], [place].  Emergency Department Resource Guide 1) Find a Doctor and Pay Out of Pocket Although you won't have to find out who is covered by your insurance plan, it is a good idea to ask around and get recommendations. You will then need to call the office and see if the doctor you have chosen will accept you as a new patient and what types of options they offer for patients who are self-pay. Some doctors offer discounts or will set up payment plans for their patients who do not have insurance, but you will need to ask so you aren't surprised when you get to your appointment.  2) Contact Your Local Health Department Not all health departments have doctors that can see patients for sick visits, but many do, so it is worth a call to see if yours does. If you don't know where your local health department is, you can check in your phone book. The CDC also has a tool to help you locate your state's health department, and  many state websites also have listings of all of their local health departments.  3) Find a Walk-in Clinic If your illness is not likely to be very severe or complicated, you may want to try a walk in clinic. These are popping up all over the country in pharmacies, drugstores, and shopping centers. They're usually staffed by nurse practitioners or physician assistants that have been trained to treat common illnesses and complaints. They're usually fairly quick and inexpensive. However, if you have serious medical issues or chronic medical problems,  these are probably not your best option.  No Primary Care Doctor: - Call Health Connect at  [phone] - they can help you locate a primary care doctor that  accepts your insurance, provides certain services, etc. - Physician Referral Service- [phone]  Chronic Pain Problems: Organization         Address  Phone   Notes  [facility]  [phone] Patients need to be referred by their primary care doctor.   Medication Assistance: Organization         Address  Phone   Notes  [facility] Medication [facility] [address] [place], [place] [address] [phone] --Must be a resident of [place] -- Must have NO insurance coverage whatsoever (no Medicaid/ Medicare, etc.) -- The pt. MUST have a primary care doctor that directs their care regularly and follows them in the community   MedAssist  [phone]   [employer]  [phone]    Agencies that provide inexpensive medical care: Organization         Address  Phone   Notes  [facility]  [phone]   [facility]    [phone]) [phone]   [facility] [address] [place], [place] [phone]) [phone]   [facility] [address], [place] [phone]   Planned Parenthood    [phone]) [phone]   [facility]    [phone]   [facility]  [address], [place] Phone:  [phone]) [phone], Fax:  [phone] Hours of Operation:  9 am - 6 pm, M-F.  Also accepts Medicaid/Medicare and self-pay.  [facility]  [address], [place] Phone: [phone]) [phone], Fax: [phone]. Hours of Operation:  8:30 am - 5:30 pm, M-F.  Also accepts Medicaid and self-pay.  [facility] [place] [address], [place] Phone: [phone]) [phone]   [facility] [address], [place], [place] [phone], Ext. [phone] Mondays &  Thursdays: 7-9 AM.  First 15 patients are seen on a first come, first serve basis.    Medicaid-accepting [facility] Providers:  Organization         Address  Phone   Notes  [facility] [address], [place] [phone]) [phone] Also accepts self-pay patients.  [facility] [address], [place]  [phone]   [facility] [address], [place] [phone]) [phone]   [facility] [address] [place] [address], [place] [phone]) [phone]   Renaye RakersVeita Bland [address], [place]   [phone] Only accepts [place] Access [place] patients after they have their name applied to their card.   Self-Pay (no insurance) in [facility] [place]:  Organization  Address  Phone   Notes  Sickle Cell Patients, [facility] [address] [place], [place] [phone]   [facility] [address] [place], [place] [phone]   [facility]  [address], [place] [phone]   [facility]/Dr. Osei-Bonsu  [address] [place] or [address], [place] [phone] Phone number for both [place] and [place] locations is the same.  [facility] [address], [place] [phone]   [facility] [address] [place] or [address] [phone]   [facility] [address], [place] [phone], phone; [phone], fax Sees patients 1st and 3rd Saturday of every month.  Must not qualify for public or private insurance (i.e. Medicaid, Medicare, [place] Health Choice, Veterans' Benefits)  Household income should be no more than 200% of the poverty level The clinic cannot treat you if you are pregnant or think you are pregnant  Sexually transmitted diseases are not treated at the  clinic.    Dental Care: Organization         Address  Phone  Notes  [facility] of [facility] [address] [place], [place] [phone] Accepts children up to age 52 who are enrolled in [place] or [place] Health Choice; pregnant women with a Medicaid card; and children who have applied for Medicaid or [place] Health Choice, but were declined, whose parents can pay a reduced fee at time of service.  [facility] of [facility]  [address], [place] [phone] Accepts children up to age 81 who are enrolled in [place] or [place] Health Choice; pregnant women with a Medicaid card; and children who have applied for Medicaid or [place] Health Choice, but were declined, whose parents can pay a reduced fee at time of service.  [facility]  [address] [place], [place] [phone] Patients are seen by appointment only. Walk-ins are not accepted. [facility] will see patients 34 years of age and older. Monday - Tuesday (8am-5pm) Most Wednesdays (8:30-5pm) $30 per visit, cash only  [facility] Adult Dental Access PROGRAM  [address], [facility] [phone] Patients are seen by appointment only. Walk-ins are not accepted. [facility] will see patients 62 years of age and older. One Wednesday Evening (Monthly: Volunteer Based).  $30 per visit, cash only  [employer]  [phone] for adults; Children under age 81, call Graduate Pediatric Dentistry at [phone]. Children aged 26-14, please call [phone] to request a pediatric application.  Dental services are provided in all areas of dental care including fillings, crowns and bridges, complete and partial dentures, implants, gum treatment, root canals, and extractions. Preventive care is also provided. Treatment is provided to both adults and children. Patients are selected via a lottery and there is often a  waiting list.   [facility] [address] [place]  [phone] [email or web address]   [facility] [address] [place], [place] [phone], Ext. [phone] Second and Fourth Thursday of each month, opens at 6:30 AM; Clinic ends at 9 AM.  Patients are seen on a first-come first-served basis, and a limited number are seen during each clinic.   [facility]  [address] [place], [place]  [place], [place] [phone]   Eligibility Requirements You must have lived in [place], [place], or [place] counties for at least the last three months.   You cannot be eligible for state or federal sponsored [employer], including [employer], [place], or [employer].   You generally cannot be eligible for healthcare insurance through your employer.    How to apply: Eligibility screenings are held every Tuesday and Wednesday afternoon from 1:00 pm until 4:00 pm. You do not need an appointment for the interview!  [facility] [address], [place], [place]   [place]  [place] Department  [place]  [phone]    Behavioral Health Resources in the Community: Intensive Outpatient Programs Organization         Address  Phone  Notes  [facility] [place] [address] [place], [place] [phone]   [facility] Outpatient [address] [place], [place]   ADS: [facility] [address] [place] [address] [place], [place]   [place] [address],  [place], [place] or [phone]   Substance Abuse Resources Organization         Address  Phone  Notes  [facility] Drug [facility]  [phone]   [place]  [phone]   The [place]   Chinita Pester  [phone]   Residential & Outpatient Substance Abuse  Program  [phone]   Psychological Services Organization         Address  Phone  Notes  [facility] [place]  [place]  [phone]   [place] [address], [place] or [phone]    Mobile Crisis Teams Organization         Address  Phone  Notes  [facility], Mobile Crisis Care Unit  [phone]   [facility]  [address] [place], [place]   [place] [address], [place] [phone]    Self-Help/Support Groups Organization         Address  Phone             Notes  [place] of [place] - variety of support groups  [place] Call for more information  Narcotics Anonymous (NA), [facility] [address], [employer] [place]  2 meetings at this location   Special educational needs teacher         Address  Phone  Notes  [facility] [place],    [place]  [phone]   [facility]  [address] [place] 625638, [place], [place]   Alexander Hepler, [place] [phone] Admissions: 8am-3pm M-F  Incentives Substance [facility] [address],    [place], [place] [phone]   [facility] [address] Jadene Pierini [place], [place]   [facility] [address],  [place], [place]   [facility] - Intensive Outpatient [address] Kristeen [address], [place], [place]   [facility] ([place].,  [place], [place] or [phone]   [facility] (RTS) [address] [place], [place] Accepts Medicaid  [facility] [place] [address],  [place] [phone] Substance Abuse/Addiction Treatment   [facility] Resources Organization  Address  Phone  Notes  °[facility]  [phone]   °Julie Brannon, PhD [address] [place], [place]   [phone] or [phone]   °[facility]   [address] °[place], [place] [phone]   °[facility] [address], [place], [place] [phone] Insurance/Medicaid/sponsorship through [facility]  °[facility] [address]                                    [place], [place] [phone] Therapy/tele-psych/case  °[facility] [address]  ° [place], [place] [phone]    °Dr. Arfeen  [phone]   °[facility] [place]  [employer] [place] [facility] 1) [address], [place] °2) [address], [place] °3)  [address], [place] [phone] °([phone] ° °([phone]   °[place] Child Abuse Hotline [phone]) [phone] or [phone]) [phone] (After Hours)    ° ° ° °

## 2013-08-03 NOTE — ED Notes (Signed)
The pt is c/o a toothache since yesterday.  Swollen rt face today with increased pain

## 2013-08-03 NOTE — ED Notes (Signed)
Pt complains of right upper dental pain since yesterday

## 2013-08-03 NOTE — Telephone Encounter (Signed)
Pt referred to Dr Lucky CowboyKnox for Dental pain.  She called office and was told they were not on call.  Pt given referral to DDS on call for 4/13 Dr Mayford Knifeurner.

## 2013-08-08 NOTE — ED Provider Notes (Signed)
CSN: [id number]     Arrival date & time 08/03/13  0306 History   First MD Initiated Contact with Patient 08/03/13 0414     Chief Complaint  Patient presents with  . Dental Pain    (Consider location/radiation/quality/duration/timing/severity/associated sxs/prior Treatment) Patient is a 33 y.o. female presenting with tooth pain. The history is provided by the patient. No language interpreter was used.  Dental Pain Toothache location: Right superior. Severity:  Moderate Onset quality:  Gradual Duration:  2 days Timing:  Constant Progression:  Worsening Chronicity:  New Context: dental caries and poor dentition   Context: not trauma   Relieved by:  Nothing Worsened by:  Touching (eating) Ineffective treatments: NSAIDs. Associated symptoms: facial swelling   Associated symptoms: no difficulty swallowing, no drooling, no fever, no oral bleeding, no oral lesions and no trismus   Risk factors: lack of dental care and smoking     History reviewed. No pertinent past medical history. Past Surgical History  Procedure Laterality Date  . Ectopic pregnancy surgery    . Mandible surgery    . Tubal ligation     Family History  Problem Relation Age of Onset  . Hypertension Father   . Thyroid disease Mother   . Diabetes Maternal Grandmother   . Thyroid disease Maternal Grandmother   . Diabetes Maternal Aunt   . Diabetes Maternal Aunt   . Kidney disease Maternal Aunt    History  Substance Use Topics  . Smoking status: Current Every Day Smoker -- 1.00 packs/day for 10 years    Types: Cigarettes  . Smokeless tobacco: Never Used  . Alcohol Use: 0.6 oz/week    1 Glasses of wine per week     Comment: occasion   OB History   Grav Para Term Preterm Abortions TAB SAB Ect Mult Living                 Review of Systems  Constitutional: Negative for fever.  HENT: Positive for dental problem and facial swelling. Negative for drooling and mouth sores.   All other systems reviewed and are  negative.     Allergies  Food allergy formula  Home Medications   Prior to Admission medications   Medication Sig Start Date End Date Taking? Authorizing Provider  acetaminophen (TYLENOL) 500 MG tablet Take 500 mg by mouth every 6 (six) hours as needed (pain).   Yes Historical Provider, MD  albuterol (PROVENTIL HFA;VENTOLIN HFA) 108 (90 BASE) MCG/ACT inhaler Inhale 2 puffs into the lungs every 6 (six) hours as needed for wheezing or shortness of breath.   Yes Historical Provider, MD  Aspirin-Salicylamide-Caffeine (BC HEADACHE POWDER PO) Take 1 packet by mouth every 8 (eight) hours as needed (pain).   Yes Historical Provider, MD  ibuprofen (ADVIL,MOTRIN) 200 MG tablet Take 400 mg by mouth every 6 (six) hours as needed (pain).   Yes Historical Provider, MD  naproxen sodium (ANAPROX) 220 MG tablet Take 440 mg by mouth 2 (two) times daily as needed (tooth pain).   Yes Historical Provider, MD  pseudoephedrine (SUDAFED) 30 MG tablet Take 30 mg by mouth every 8 (eight) hours as needed for congestion (pain).   Yes Historical Provider, MD  HYDROcodone-acetaminophen (NORCO/VICODIN) 5-325 MG per tablet Take 1 tablet by mouth every 4 (four) hours as needed. 08/03/13   Antony MaduraKelly Neven Fina, PA-C  naproxen (NAPROSYN) 500 MG tablet Take 1 tablet (500 mg total) by mouth 2 (two) times daily. 08/03/13   Antony MaduraKelly Sofija Antwi, PA-C  penicillin v potassium (  VEETID) 500 MG tablet Take 1 tablet (500 mg total) by mouth 4 (four) times daily. 08/03/13 08/10/13  Antony MaduraKelly Keirstan Iannello, PA-C   BP 169/107  Pulse 78  Temp(Src) 97.8 F (36.6 C) (Oral)  Resp 24  SpO2 100%  LMP 07/31/2013  Physical Exam  Nursing note and vitals reviewed. Constitutional: She is oriented to person, place, and time. She appears well-developed and well-nourished. No distress.  Nontoxic/nonseptic appearing  HENT:  Head: Normocephalic and atraumatic.  Right Ear: External ear normal. No mastoid tenderness.  Left Ear: External ear normal. No mastoid tenderness.  Nose:  Nose normal.  Mouth/Throat: Uvula is midline, oropharynx is clear and moist and mucous membranes are normal. No oral lesions. No trismus in the jaw. Abnormal dentition. Dental abscesses and dental caries present. No uvula swelling.  Uvula midline. No trismus or stridor. Patient tolerating secretions without difficulty or drooling. Swelling to gingiva and buccal mucosa of R upper dentition without area of fluctuance or active purulent weeping/drainage.  Eyes: Conjunctivae and EOM are normal. Pupils are equal, round, and reactive to light. No scleral icterus.  Neck: Normal range of motion. Neck supple.  Pulmonary/Chest: Effort normal. No stridor. No respiratory distress.  Musculoskeletal: Normal range of motion.  Neurological: She is alert and oriented to person, place, and time.  Skin: Skin is warm and dry. No rash noted. She is not diaphoretic. No erythema. No pallor.  Psychiatric: She has a normal mood and affect. Her behavior is normal.    ED Course  Procedures (including critical care time) Labs Review Labs Reviewed - No data to display  Imaging Review No results found.   EKG Interpretation None      MDM   Final diagnoses:  Dental abscess    Patient with toothache x 2 days. Physical exam findings suggest early dental abscess. No trismus or stridor. Patient tolerating secretions without difficulty. Exam today is not concerning for Ludwig's angina or spread of infection. Will treat with penicillin and pain medicine. Urged patient to follow-up with dentist; resource guide provided. Hemodynamically stable, afebrile, and appropriate for d/c. Patient agreeable to plan with no unaddressed concerns.     Filed Vitals:   08/03/13 0331  BP: 169/107  Pulse: 78  Temp: 97.8 F (36.6 C)  TempSrc: Oral  Resp: 24  SpO2: 100%      Antony MaduraKelly Jouri Threat, PA-C 08/08/13 1652

## 2013-08-09 NOTE — ED Provider Notes (Signed)
Medical screening examination/treatment/procedure(s) were performed by non-physician practitioner and as supervising physician I was immediately available for consultation/collaboration.   EKG Interpretation None        Adrielle Polakowski M Lujean Ebright, MD 08/09/13 1412 

## 2013-11-25 ENCOUNTER — Encounter (HOSPITAL_COMMUNITY): Payer: Self-pay | Admitting: Emergency Medicine

## 2013-11-25 ENCOUNTER — Emergency Department (HOSPITAL_COMMUNITY)
Admission: EM | Admit: 2013-11-25 | Discharge: 2013-11-25 | Disposition: A | Discharge status: Home / Self Care | Payer: BC Managed Care – PPO | Attending: Emergency Medicine | Admitting: Emergency Medicine

## 2013-11-25 ENCOUNTER — Emergency Department (HOSPITAL_COMMUNITY): Payer: BC Managed Care – PPO

## 2013-11-25 DIAGNOSIS — Z79899 Other long term (current) drug therapy: Secondary | ICD-10-CM | POA: Insufficient documentation

## 2013-11-25 DIAGNOSIS — IMO0001 Reserved for inherently not codable concepts without codable children: Secondary | ICD-10-CM | POA: Insufficient documentation

## 2013-11-25 DIAGNOSIS — F17200 Nicotine dependence, unspecified, uncomplicated: Secondary | ICD-10-CM | POA: Insufficient documentation

## 2013-11-25 DIAGNOSIS — R0981 Nasal congestion: Secondary | ICD-10-CM

## 2013-11-25 DIAGNOSIS — M542 Cervicalgia: Secondary | ICD-10-CM | POA: Insufficient documentation

## 2013-11-25 DIAGNOSIS — R6883 Chills (without fever): Secondary | ICD-10-CM | POA: Insufficient documentation

## 2013-11-25 DIAGNOSIS — J3489 Other specified disorders of nose and nasal sinuses: Secondary | ICD-10-CM | POA: Insufficient documentation

## 2013-11-25 DIAGNOSIS — R079 Chest pain, unspecified: Secondary | ICD-10-CM | POA: Insufficient documentation

## 2013-11-25 DIAGNOSIS — J209 Acute bronchitis, unspecified: Secondary | ICD-10-CM | POA: Insufficient documentation

## 2013-11-25 DIAGNOSIS — R062 Wheezing: Secondary | ICD-10-CM | POA: Insufficient documentation

## 2013-11-25 DIAGNOSIS — I10 Essential (primary) hypertension: Secondary | ICD-10-CM | POA: Insufficient documentation

## 2013-11-25 DIAGNOSIS — IMO0002 Reserved for concepts with insufficient information to code with codable children: Secondary | ICD-10-CM | POA: Insufficient documentation

## 2013-11-25 DIAGNOSIS — J069 Acute upper respiratory infection, unspecified: Secondary | ICD-10-CM

## 2013-11-25 MED ORDER — PREDNISONE 20 MG PO TABS: ORAL_TABLET | ORAL | Status: DC

## 2013-11-25 MED ORDER — IPRATROPIUM-ALBUTEROL 18-103 MCG/ACT IN AERO: 2.0000 | INHALATION_SPRAY | RESPIRATORY_TRACT | Status: DC | PRN

## 2013-11-25 MED ORDER — IPRATROPIUM BROMIDE 0.02 % IN SOLN
0.5000 mg | Freq: Once | RESPIRATORY_TRACT | Status: AC
  Administered 2013-11-25: 0.5 mg via RESPIRATORY_TRACT
  Filled 2013-11-25: qty 2.5

## 2013-11-25 MED ORDER — METHYLPREDNISOLONE SODIUM SUCC 125 MG IJ SOLR: 125.0000 mg | Freq: Once | INTRAMUSCULAR | Status: DC

## 2013-11-25 MED ORDER — ALBUTEROL SULFATE (2.5 MG/3ML) 0.083% IN NEBU
5.0000 mg | INHALATION_SOLUTION | Freq: Once | RESPIRATORY_TRACT | Status: AC
  Administered 2013-11-25: 5 mg via RESPIRATORY_TRACT
  Filled 2013-11-25: qty 6

## 2013-11-25 MED ORDER — FLUTICASONE PROPIONATE 50 MCG/ACT NA SUSP: 2.0000 | Freq: Every day | NASAL | Status: DC

## 2013-11-25 MED ORDER — AEROCHAMBER PLUS W/MASK MISC: Status: DC

## 2013-11-25 MED ORDER — GUAIFENESIN ER 600 MG PO TB12: 600.0000 mg | ORAL_TABLET | Freq: Two times a day (BID) | ORAL | Status: DC | PRN

## 2013-11-25 MED ORDER — PREDNISONE 20 MG PO TABS
60.0000 mg | ORAL_TABLET | Freq: Once | ORAL | Status: AC
  Administered 2013-11-25: 60 mg via ORAL
  Filled 2013-11-25: qty 3

## 2013-11-25 NOTE — ED Notes (Signed)
Patient arrives POV, by herself, c/o SOB with wheezing and upper midsternal chest wall pain radiating to mid back, pain 10/10. Patient reports productive cough with "lime green" sputum. Patient reports intermittent lightheadedness and dizziness. Symptoms x1 day.

## 2013-11-25 NOTE — Discharge Instructions (Signed)
Continue to stay well-hydrated. Continue to alternate between Tylenol and Ibuprofen for pain or fever. May consider over-the-counter Benadryl for additional relief. Followup with your primary care doctor in 5-7 days for recheck of ongoing symptoms that return to emergency department for emergent changing or worsening of symptoms. Use inhaler as directed, as needed for cough/chest congestion. Use Mucinex for cough suppression/expectoration of mucus. Use Prednisone as directed, taken with breakfast, to help with breathing and neck pain and congestion. Use flonase as directed to help with nasal congestion. Use steam from hot shower or boiling water to help with congestion.   Upper Respiratory Infection, Adult An upper respiratory infection (URI) is also known as the common cold. It is often caused by a type of germ (virus). Colds are easily spread (contagious). You can pass it to others by kissing, coughing, sneezing, or drinking out of the same glass. Usually, you get better in 1 or 2 weeks.  HOME CARE   Only take medicine as told by your doctor.  Use a warm mist humidifier or breathe in steam from a hot shower.  Drink enough water and fluids to keep your pee (urine) clear or pale yellow.  Get plenty of rest.  Return to work when your temperature is back to normal or as told by your doctor. You may use a face mask and wash your hands to stop your cold from spreading. GET HELP RIGHT AWAY IF:   After the first few days, you feel you are getting worse.  You have questions about your medicine.  You have chills, shortness of breath, or brown or red spit (mucus).  You have yellow or brown snot (nasal discharge) or pain in the face, especially when you bend forward.  You have a fever, puffy (swollen) neck, pain when you swallow, or white spots in the back of your throat.  You have a bad headache, ear pain, sinus pain, or chest pain.  You have a high-pitched whistling sound when you breathe in and  out (wheezing).  You have a lasting cough or cough up blood.  You have sore muscles or a stiff neck. MAKE SURE YOU:   Understand these instructions.  Will watch your condition.  Will get help right away if you are not doing well or get worse. Document Released: 09/26/2007 Document Revised: 07/02/2011 Document Reviewed: 07/15/2013 [facility] Patient Information 2015 [place], [place]. This information is not intended to replace advice given to you by your health care provider. Make sure you discuss any questions you have with your health care provider.  Metered Dose Inhaler with Spacer Inhaled medicines are the basis of treatment of asthma and other breathing problems. Inhaled medicine can only be effective if used properly. Good technique assures that the medicine reaches the lungs. Your health care provider has asked you to use a spacer with your inhaler to help you take the medicine more effectively. A spacer is a plastic tube with a mouthpiece on one end and an opening that connects to the inhaler on the other end. Metered dose inhalers (MDIs) are used to deliver a variety of inhaled medicines. These include quick relief or rescue medicines (such as bronchodilators) and controller medicines (such as corticosteroids). The medicine is delivered by pushing down on a metal canister to release a set amount of spray. If you are using different kinds of inhalers, use your quick relief medicine to open the airways 10-15 minutes before using a steroid if instructed to do so by your health care provider.  If you are unsure which inhalers to use and the order of using them, ask your health care provider, nurse, or respiratory therapist. HOW TO USE THE INHALER WITH A SPACER 1. Remove cap from inhaler. 2. If you are using the inhaler for the first time, you will need to prime it. Shake the inhaler for 5 seconds and release four puffs into the air, away from your face. Ask your health care provider or  pharmacist if you have questions about priming your inhaler. 3. Shake inhaler for 5 seconds before each breath in (inhalation). 4. Place the open end of the spacer onto the mouthpiece of the inhaler. 5. Position the inhaler so that the top of the canister faces up and the spacer mouthpiece faces you. 6. Put your index finger on the top of the medicine canister. Your thumb supports the bottom of the inhaler and the spacer. 7. Breathe out (exhale) normally and as completely as possible. 8. Immediately after exhaling, place the spacer between your teeth and into your mouth. Close your mouth tightly around the spacer. 9. Press the canister down with the index finger to release the medicine. 10. At the same time as the canister is pressed, inhale deeply and slowly until the lungs are completely filled. This should take 4-6 seconds. Keep your tongue down and out of the way. 11. Hold the medicine in your lungs for 5-10 seconds (10 seconds is best). This helps the medicine get into the small airways of your lungs. Exhale. 12. Repeat inhaling deeply through the spacer mouthpiece. Again hold that breath for up to 10 seconds (10 seconds is best). Exhale slowly. If it is difficult to take this second deep breath through the spacer, breathe normally several times through the spacer. Remove the spacer from your mouth. 13. Wait at least 15-30 seconds between puffs. Continue with the above steps until you have taken the number of puffs your health care provider has ordered. Do not use the inhaler more than your health care provider directs you to. 14. Remove spacer from the inhaler and place cap on inhaler. 15. Follow the directions from your health care provider or the inhaler insert for cleaning the inhaler and spacer. If you are using a steroid inhaler, rinse your mouth with water after your last puff, gargle, and spit out the water. Do not swallow the water. AVOID:  Inhaling before or after starting the spray  of medicine. It takes practice to coordinate your breathing with triggering the spray.  Inhaling through the nose (rather than the mouth) when triggering the spray. HOW TO DETERMINE IF YOUR INHALER IS FULL OR NEARLY EMPTY You cannot know when an inhaler is empty by shaking it. A few inhalers are now being made with dose counters. Ask your health care provider for a prescription that has a dose counter if you feel you need that extra help. If your inhaler does not have a counter, ask your health care provider to help you determine the date you need to refill your inhaler. Write the refill date on a calendar or your inhaler canister. Refill your inhaler 7-10 days before it runs out. Be sure to keep an adequate supply of medicine. This includes making sure it is not expired, and you have a spare inhaler.  SEEK MEDICAL CARE IF:   Symptoms are only partially relieved with your inhaler.  You are having trouble using your inhaler.  You experience some increase in phlegm. SEEK IMMEDIATE MEDICAL CARE IF:   You  feel little or no relief with your inhalers. You are still wheezing and are feeling shortness of breath or tightness in your chest or both.  You have dizziness, headaches, or fast heart rate.  You have chills, fever, or night sweats.  There is a noticeable increase in phlegm production, or there is blood in the phlegm. Document Released: 04/09/2005 Document Revised: 08/24/2013 Document Reviewed: 09/25/2012 [facility] Patient Information 2015 [place], [place]. This information is not intended to replace advice given to you by your health care provider. Make sure you discuss any questions you have with your health care provider.  How to Use an Inhaler Using your inhaler correctly is very important. Good technique will make sure that the medicine reaches your lungs.  HOW TO USE AN INHALER: 1. Take the cap off the inhaler. 2. If this is the first time using your inhaler, you need to prime it. Shake  the inhaler for 5 seconds. Release four puffs into the air, away from your face. Ask your doctor for help if you have questions. 3. Shake the inhaler for 5 seconds. 4. Turn the inhaler so the bottle is above the mouthpiece. 5. Put your pointer finger on top of the bottle. Your thumb holds the bottom of the inhaler. 6. Open your mouth. 7. Either hold the inhaler away from your mouth (the width of 2 fingers) or place your lips tightly around the mouthpiece. Ask your doctor which way to use your inhaler. 8. Breathe out as much air as possible. 9. Breathe in and push down on the bottle 1 time to release the medicine. You will feel the medicine go in your mouth and throat. 10. Continue to take a deep breath in very slowly. Try to fill your lungs. 11. After you have breathed in completely, hold your breath for 10 seconds. This will help the medicine to settle in your lungs. If you cannot hold your breath for 10 seconds, hold it for as long as you can before you breathe out. 12. Breathe out slowly, through pursed lips. Whistling is an example of pursed lips. 13. If your doctor has told you to take more than 1 puff, wait at least 15-30 seconds between puffs. This will help you get the best results from your medicine. Do not use the inhaler more than your doctor tells you to. 14. Put the cap back on the inhaler. 15. Follow the directions from your doctor or from the inhaler package about cleaning the inhaler. If you use more than one inhaler, ask your doctor which inhalers to use and what order to use them in. Ask your doctor to help you figure out when you will need to refill your inhaler.  If you use a steroid inhaler, always rinse your mouth with water after your last puff, gargle and spit out the water. Do not swallow the water. GET HELP IF:  The inhaler medicine only partially helps to stop wheezing or shortness of breath.  You are having trouble using your inhaler.  You have some increase in thick  spit (phlegm). GET HELP RIGHT AWAY IF:  The inhaler medicine does not help your wheezing or shortness of breath or you have tightness in your chest.  You have dizziness, headaches, or fast heart rate.  You have chills, fever, or night sweats.  You have a large increase of thick spit, or your thick spit is bloody. MAKE SURE YOU:   Understand these instructions.  Will watch your condition.  Will get help right  away if you are not doing well or get worse. Document Released: 01/17/2008 Document Revised: 01/28/2013 Document Reviewed: 11/06/2012 [facility] Patient Information 2015 [place], [place]. This information is not intended to replace advice given to you by your health care provider. Make sure you discuss any questions you have with your health care provider.  Cool Mist Vaporizers Vaporizers may help relieve the symptoms of a cough and cold. They add moisture to the air, which helps mucus to become thinner and less sticky. This makes it easier to breathe and cough up secretions. Cool mist vaporizers do not cause serious burns like hot mist vaporizers, which may also be called steamers or humidifiers. Vaporizers have not been proven to help with colds. You should not use a vaporizer if you are allergic to mold. HOME CARE INSTRUCTIONS  Follow the package instructions for the vaporizer.  Do not use anything other than distilled water in the vaporizer.  Do not run the vaporizer all of the time. This can cause mold or bacteria to grow in the vaporizer.  Clean the vaporizer after each time it is used.  Clean and dry the vaporizer well before storing it.  Stop using the vaporizer if worsening respiratory symptoms develop. Document Released: 01/05/2004 Document Revised: 04/14/2013 Document Reviewed: 08/27/2012 [facility] Patient Information 2015 [place], [place]. This information is not intended to replace advice given to you by your health care provider. Make sure you discuss any questions  you have with your health care provider.   Emergency Department Resource Guide 1) Find a Doctor and Pay Out of Pocket Although you won't have to find out who is covered by your insurance plan, it is a good idea to ask around and get recommendations. You will then need to call the office and see if the doctor you have chosen will accept you as a new patient and what types of options they offer for patients who are self-pay. Some doctors offer discounts or will set up payment plans for their patients who do not have insurance, but you will need to ask so you aren't surprised when you get to your appointment.  2) Contact Your Local Health Department Not all health departments have doctors that can see patients for sick visits, but many do, so it is worth a call to see if yours does. If you don't know where your local health department is, you can check in your phone book. The CDC also has a tool to help you locate your state's health department, and many state websites also have listings of all of their local health departments.  3) Find a Walk-in Clinic If your illness is not likely to be very severe or complicated, you may want to try a walk in clinic. These are popping up all over the country in pharmacies, drugstores, and shopping centers. They're usually staffed by nurse practitioners or physician assistants that have been trained to treat common illnesses and complaints. They're usually fairly quick and inexpensive. However, if you have serious medical issues or chronic medical problems, these are probably not your best option.  No Primary Care Doctor: - Call Health Connect at  [phone] - they can help you locate a primary care doctor that  accepts your insurance, provides certain services, etc. - Physician Referral Service- [phone]  Chronic Pain Problems: Organization         Address  Phone   Notes  [facility]  [phone] Patients need to be referred by their  primary care  doctor.   Medication Assistance: Organization         Address  Phone   Notes  [facility] Medication [facility] [address] [place] [place], [place] [address] [phone] --Must be a resident of [facility] -- Must have NO insurance coverage whatsoever (no Medicaid/ Medicare, etc.) -- The pt. MUST have a primary care doctor that directs their care regularly and follows them in the community   [facility]  [phone]   [employer]  [phone]    Agencies that provide inexpensive medical care: Organization         Address  Phone   Notes  [facility]  [phone]   [facility]    [phone]   [facility] [address] [place], [place] [address] [phone]   [facility] [address] [place] [phone]   Planned Parenthood    [phone]   [facility]    [phone]   [facility]  [address], [place] Phone:  [phone], Fax:  [phone] Hours of Operation:  9 am - 6 pm, M-F.  Also accepts Medicaid/Medicare and self-pay.  [facility]  [address], [place] Phone: [phone], Fax: [phone]. Hours of Operation:  8:30 am - 5:30 pm, M-F.  Also accepts Medicaid and self-pay.  [facility] [place] [address], [place] Phone: [phone]   [facility] [address] Leonette Bence [place], [place] [phone], Ext. [phone] Mondays & Thursdays: 7-9 AM.  First 15 patients are seen on a first come, first serve basis.    Medicaid-accepting [facility] Providers:  Organization         Address  Phone   Notes  [facility] [address], [place] [phone] Also accepts self-pay patients.  [facility] [address] Laurell Josephs [place], [place]  [phone]   [facility] [address], [place] [phone]   [facility] [address], [place] [phone]   Renaye Rakers [address], [place]   [phone] Only accepts [place] patients after they have their name applied to their card.   Self-Pay (no insurance) in [facility]:  Organization         Address  Phone   Notes  Sickle Cell Patients, [facility] [address] [place], [place] [phone]   [facility] [address] [place], [place] [phone]   [facility] [place]  [address], [place] [phone]   [facility]/Dr. Osei-Bonsu  [address] [place] or [address], [place] [phone] Phone number for both [place] and [place] locations is the same.  [facility] [address] [place] [phone]   [facility] [address], [place] or [address] [phone]   [facility] [address] [place] [phone], phone; [phone], fax Sees patients 1st and 3rd Saturday of every month.  Must not qualify for public or private insurance (i.e. Medicaid, Medicare, [place] Health Choice, Veterans' Benefits)  Household income should be no more than 200% of the poverty  level The clinic cannot treat you if you are pregnant or think you are pregnant  Sexually transmitted diseases are not treated at the clinic.    Dental Care: Organization         Address  Phone  Notes  [facility] [address], [place] [phone] Accepts children up to age 35 who are enrolled in [place] or [place] Health Choice; pregnant women with a Medicaid card; and children who have applied for Medicaid or [place] Health Choice, but were declined, whose parents can pay a reduced fee  at time of service.  [facility] [place]  [address], [place] [phone] Accepts children up to age 35 who are enrolled in [place] or [place] Health Choice; pregnant women with a Medicaid card; and children who have applied for Medicaid or [place] Health Choice, but were declined, whose parents can pay a reduced fee at time of service.  [facility]  [address], [place] [phone] Patients are seen by appointment only. Walk-ins are not accepted. [facility] will see patients 35 years of age and older. Monday - Tuesday (8am-5pm) Most Wednesdays (8:30-5pm) $30 per visit, cash only  [facility]  [address], [facility] [phone] Patients are seen by appointment only. Walk-ins are not accepted. [facility] will see patients 35 years of age and older. One Wednesday Evening (Monthly: Volunteer Based).  $30 per visit, cash only  [facility]  [phone] for adults; Children under age 554, call Graduate Pediatric Dentistry at [phone]. Children aged 724-14, please call [phone] to request a pediatric application.  Dental services are provided in all areas of dental care including fillings, crowns and bridges, complete and partial dentures, implants, gum treatment, root canals, and extractions. Preventive care is also provided. Treatment is provided to both adults and children. Patients are selected via a lottery and there is often a waiting list.   [facility] [address], [place]  [phone] [email or web address]   [facility] [address], [place], [place] [phone], Ext. [phone] Second and Fourth Thursday of each month, opens at 6:30 AM; Clinic ends at 9 AM.  Patients are seen on a first-come first-served basis, and a limited number are seen during each clinic.   [facility]  [address],  [place], [place] [phone]   Eligibility Requirements You must have lived in [place], [place], or [place] counties for at least the last three months.   You cannot be eligible for state or federal sponsored [place]healthcare insurance, including CIGNAVeterans Administration, [place]icaid, or [employer]icare.   You generally cannot be eligible for healthcare insurance through your employer.    How to apply: Eligibility screenings are held every Tuesday and Wednesday afternoon from 1:00 pm until 4:00 pm. You do not need an appointment for the interview!  [facility] [address], [place], [place] [phone]   [facility]  [phone]   [facility]  [phone]   [facility]  [phone]    Behavioral Health Resources in the Community: Intensive Outpatient Programs Organization         Address  Phone  Notes  [facility] [address], [place], [place] [phone]   [facility] [address], [place], [place] [phone]   [facility] [address], [place], [place]  [phone]   [facility] [address],  [place], [place] [phone] or [phone]   Substance Abuse Resources Organization         Address  Phone  Notes  Alcohol [facility] Drug Services  [phone]   [facility]  [phone]   [facility] [place]  [phone]   Floydene Flock  [phone]   Residential & Outpatient Substance Abuse Program  [phone]   Psychological Services Organization         Address  Phone  Notes  [facility]  [phone]   [facility]  [phone]   [facility] [address] [place], [place] [phone] or [phone]    Mobile Crisis Teams Organization         Address  Phone  Notes  [facility], Mobile Crisis Care Unit  [phone]   [facility]ive Psychotherapeutic  Services  [address] [place], [place] [phone]   Doristine Locks [address] [place] [phone]    Self-Help/Support Groups Organization         Address  Phone             Notes  [facility] [place] - variety of support groups  [phone] Call for more information  Narcotics Anonymous (NA), [facility] [address], [employer] [place]  2 meetings at this location   Statistician         Address  Phone  Notes  [facility] [address],    [place]  [phone]   [facility]  [address], [place] [address], [place], [place] [phone]   [facility] [address] [place], [place] [phone] Admissions: 8am-3pm M-F  [facility] [address],    [place], [place] [phone]   [facility] [address] [place], [place], [place] [phone]   [facility] [address],  [place], [place] [phone]   [facility] - Intensive Outpatient [address], [place], [place] [phone]   [facility] ([facility] [address] [place]  [place], [place] [phone] or [phone]   [facility] [address], [place], [place] [phone] Accepts Medicaid  [facility] [address],  [place] [phone] Substance Abuse/Addiction Treatment   [facility] Organization         Address  Phone  Notes  [facility]  [phone]   Angie Fava, PhD [address] [place], [place]   [phone] or [phone]   [facility]   [address] [place], [place] [phone]   [facility] [phone] [place], [place], [place] [phone] Insurance/Medicaid/sponsorship through [facility] and Families [address]                                    [place], [place] [phone] Therapy/tele-psych/case  [facility] [address], [place] [phone]    Dr. Lolly Mustache  [phone]   [facility]  [employer] [facility] Dept. 1) [address], [place] 2) [address] [place] 3)  [address], [place] [phone])  [phone]) [phone]  [phone]) [phone]   [place] Child Abuse Hotline [phone] or [phone]) [phone] (After Hours)

## 2013-11-25 NOTE — ED Provider Notes (Signed)
CSN: [id number]     Arrival date & time 11/25/13  2047 History   First MD Initiated Contact with Patient 11/25/13 2105     Chief Complaint  Patient presents with  . Shortness of Breath  . Wheezing     (Consider location/radiation/quality/duration/timing/severity/associated sxs/prior Treatment) HPI Comments: Anne [name] is a 33 y.o. Female with a PMHx of HTN presents today with cough, SOB, and wheezing that began one day ago, with green sputum production and associated midsternal CP and myalgias/neck muscle pain. States the chest pain is moderate, constant, nonradiating, worse with coughing, improved slightly with albuterol inhaler given to her by her uncle. She has also had sinus congestion and pressure x1 day. Took tylenol 1000mg , 2 tabs of pseudophed, and alkaseltzer with no relief of symptoms. Endorses chills. Denies fevers, neck stiffness, HA, vision changes, eye symptoms, rhinorrhea, ear pain/discharge, sore throat, abd pain, N/V/D/C, rashes, dysuria, hematuria, vaginal symptoms, syncope, weakness, lightheadedness, LE edema, hx of DVT, or diaphoresis. Denies chest tightness or pressure, radiation to left arm, jaw or back. No allergen exposure. +sick contacts recently, went to [place] by plane. +smoker, 1ppd.   Patient is a 33 y.o. female presenting with cough. The history is provided by the patient. No language interpreter was used.  Cough Cough characteristics:  Productive Sputum characteristics:  Green Severity:  Moderate Onset quality:  Sudden Duration:  1 day Timing:  Constant Progression:  Unchanged Chronicity:  New Smoker: yes (1ppd)   Context: sick contacts and smoke exposure   Context: not animal exposure, not exposure to allergens and not occupational exposure   Relieved by:  Beta-agonist inhaler Worsened by:  Nothing tried Ineffective treatments:  Fluids and decongestant (alkaselzer and tylenol and pseudophed) Associated symptoms: chest pain (with cough), chills, myalgias,  shortness of breath, sinus congestion and wheezing   Associated symptoms: no diaphoresis, no ear fullness, no ear pain, no eye discharge, no fever, no headaches, no rash, no rhinorrhea and no sore throat   Risk factors: no recent infection and no recent travel     History reviewed. No pertinent past medical history. Past Surgical History  Procedure Laterality Date  . Ectopic pregnancy surgery    . Mandible surgery    . Tubal ligation     Family History  Problem Relation Age of Onset  . Hypertension Father   . Thyroid disease Mother   . Diabetes Maternal Grandmother   . Thyroid disease Maternal Grandmother   . Diabetes Maternal Aunt   . Diabetes Maternal Aunt   . Kidney disease Maternal Aunt    History  Substance Use Topics  . Smoking status: Current Every Day Smoker -- 1.00 packs/day for 10 years    Types: Cigarettes  . Smokeless tobacco: Never Used  . Alcohol Use: 0.6 oz/week    1 Glasses of wine per week     Comment: occasion   OB History   Grav Para Term Preterm Abortions TAB SAB Ect Mult Living                 Review of Systems  Constitutional: Positive for chills. Negative for fever and diaphoresis.  HENT: Positive for congestion and sinus pressure. Negative for drooling, ear discharge, ear pain, nosebleeds, postnasal drip, rhinorrhea, sneezing, sore throat, tinnitus, trouble swallowing and voice change.   Eyes: Negative for photophobia, pain, discharge, itching and visual disturbance.  Respiratory: Positive for cough, shortness of breath and wheezing. Negative for chest tightness and stridor.   Cardiovascular: Positive for chest pain (  with cough). Negative for palpitations and leg swelling.  Gastrointestinal: Negative for nausea, vomiting, abdominal pain, diarrhea, constipation, blood in stool and abdominal distention.  Genitourinary: Negative for dysuria, urgency, frequency and hematuria.  Musculoskeletal: Positive for myalgias and neck pain. Negative for  arthralgias, back pain, joint swelling and neck stiffness.  Skin: Negative for color change and rash.  Neurological: Negative for dizziness, weakness and headaches.  Psychiatric/Behavioral: Negative for confusion.  10 Systems reviewed and are negative for acute change except as noted in the HPI.     Allergies  Food allergy formula  Home Medications   Prior to Admission medications   Medication Sig Start Date End Date Taking? Authorizing Provider  albuterol (PROVENTIL HFA;VENTOLIN HFA) 108 (90 BASE) MCG/ACT inhaler Inhale 2 puffs into the lungs every 6 (six) hours as needed for wheezing or shortness of breath.   Yes Historical Provider, MD  Phenyleph-Doxylamine-DM-APAP (ALKA-SELTZER PLUS COLD & FLU) 10-12.5-20-650 MG PACK Take 1 Package by mouth every 4 (four) hours as needed (cold symptoms).   Yes Historical Provider, MD  albuterol-ipratropium (COMBIVENT) 18-103 MCG/ACT inhaler Inhale 2 puffs into the lungs every 4 (four) hours as needed for wheezing or shortness of breath. 11/25/13   Hassan Blackshire Strupp Camprubi-Soms, PA-C  fluticasone (FLONASE) 50 MCG/ACT nasal spray Place 2 sprays into both nostrils daily. 11/25/13   Harman Ferrin Strupp Camprubi-Soms, PA-C  guaiFENesin (MUCINEX) 600 MG 12 hr tablet Take 1 tablet (600 mg total) by mouth 2 (two) times daily as needed for cough or to loosen phlegm. 11/25/13   Reniah Cottingham Strupp Camprubi-Soms, PA-C  predniSONE (DELTASONE) 20 MG tablet 3 tabs po daily x 4 days 11/25/13   Brittlyn Cloe Strupp Camprubi-Soms, PA-C  Spacer/Aero-Holding Chambers (AEROCHAMBER PLUS WITH MASK) inhaler Use as instructed 11/25/13   Donnita FallsMercedes Strupp Camprubi-Soms, PA-C   BP 139/89  Pulse 104  Temp(Src) 98.7 F (37.1 C) (Oral)  Resp 20  Ht 5\' 11"  (1.803 m)  Wt 264 lb 8 oz (119.976 kg)  BMI 36.91 kg/m2  SpO2 100%  LMP 11/14/2013 Physical Exam  Nursing note and vitals reviewed. Constitutional: She is oriented to person, place, and time. Vital signs are normal. She appears well-developed  and well-nourished. She appears distressed (visibly SOB).  Afebrile (recheck 98.7), VSS (recheck BP 139/89), visibly short of breath but nontoxic appearing  HENT:  Head: Normocephalic and atraumatic.  Nose: Mucosal edema present. No rhinorrhea. Right sinus exhibits maxillary sinus tenderness and frontal sinus tenderness. Left sinus exhibits maxillary sinus tenderness and frontal sinus tenderness.  Mouth/Throat: Mucous membranes are normal. No trismus in the jaw. No uvula swelling. Posterior oropharyngeal erythema present. No oropharyngeal exudate, posterior oropharyngeal edema or tonsillar abscesses.  [place]/AT, ears clear bilaterally, nose with mild mucosal edema and erythema and no rhinorrhea. Sinuses TTP in all 4 sinuses. Posterior oropharynx erythematous without edema, tonsillar swelling/abscess, or exudates. No trismus, uvula midline. MMM  Eyes: Conjunctivae and EOM are normal. Pupils are equal, round, and reactive to light. Right eye exhibits no discharge. Left eye exhibits no discharge.  Neck: Normal range of motion. Neck supple. No spinous process tenderness and no muscular tenderness present. No rigidity. Normal range of motion present.  FROM intact with no spinous process or muscle TTP, no rigidity or meningeal signs. +LAD as below.   Cardiovascular: Normal rate, regular rhythm, normal heart sounds and intact distal pulses.  Exam reveals no gallop and no friction rub.   No murmur heard. RRR, nl s1/s2 no m/r/g, distal pulses intact and equal bilaterally  Pulmonary/Chest: No accessory  muscle usage or stridor. Tachypnea noted. No respiratory distress. She has no decreased breath sounds. She has wheezes. She has rhonchi. She has no rales. She exhibits no tenderness.  Wheezes and rhonchi diffusely throughout all lung fields, audible wheezing. Tachypneic, without resp distress or accessory muscle usage.  Abdominal: Soft. Normal appearance and bowel sounds are normal. She exhibits no distension. There  is no tenderness. There is no rigidity, no rebound, no guarding, no tenderness at McBurney's point and negative Murphy's sign.  Musculoskeletal: Normal range of motion.  All spinal levels nonTTP along spinous processes and paraspinous muscles. FROM intact. Strength 5/5 in all extremities, sensation grossly intact, cap refill <3 secs in all digits, pulses equal in all extremities.  Lymphadenopathy:       Head (right side): Submandibular adenopathy present.       Head (left side): Submandibular adenopathy present.    She has cervical adenopathy.  Diffuse superficial cervical and submandibular LAD bilaterally  Neurological: She is alert and oriented to person, place, and time. She has normal strength. No sensory deficit.  Sensation grossly intact in all extremities, strength 5/5 in all extremities  Skin: Skin is warm, dry and intact. No rash noted.  No pitting edema  Psychiatric: She has a normal mood and affect.    ED Course  Procedures (including critical care time) Labs Review Labs Reviewed - No data to display  Imaging Review No results found. CXR: not crossing over, but impression is no acute cardiopulmonary abnormality.   EKG Interpretation None      MDM   Final diagnoses:  Acute bronchitis, unspecified organism  URI, acute  Nasal sinus congestion  Wheezing    32y/o female with URI symptoms and rhonchorous breath sounds, will give steroids and nebs. Afebrile on exam, HTN noted but chronic per pt and pt is asymptomatic. Will recheck after first nebs. Doubt CP is ACS/dissection/PE, I feel this is likely related to her bronchitis symptoms. Doubt PNA at this time given that lungs are diffusely rhonchorous. Will obtain CXR.  11:08 PM Still having rhonchorous sounds in RLL, will repeat nebs. Temp rechecked orally by me, 98.31F now. BP 139/89. Pt states her breathing feels better now, but still having cough with sputum and some wheezing. Will recheck after second nebs.   11:54  PM No continued wheezing or rhonchi. Pt feeling better, oxygenating well on RA. Will d/c home with mucinex, flonase, combivent with spacer, and prednisone burst x3 days. Discussed symptomatic control with hydration and steam/cool mist. Pt is afebrile, this is likely viral URI and bronchitis. Pt is agreeable to symptomatic treatment with close follow up with PCP as needed but spoke at length about emergent changing or worsening of symptoms that should prompt return to ER. Pt voices understanding and is agreeable to plan. Stable at time of d/c.  BP 139/89  Pulse 104  Temp(Src) 98.7 F (37.1 C) (Oral)  Resp 20  Ht 5\' 11"  (1.803 m)  Wt 264 lb 8 oz (119.976 kg)  BMI 36.91 kg/m2  SpO2 100%  LMP 11/14/2013  Meds ordered this encounter  Medications  . albuterol (PROVENTIL) (2.5 MG/3ML) 0.083% nebulizer solution 5 mg    Sig:   . ipratropium (ATROVENT) nebulizer solution 0.5 mg    Sig:   . DISCONTD: methylPREDNISolone sodium succinate (SOLU-MEDROL) 125 mg/2 mL injection 125 mg    Sig:   . predniSONE (DELTASONE) tablet 60 mg    Sig:   . albuterol (PROVENTIL) (2.5 MG/3ML) 0.083% nebulizer solution 5 mg  Sig:   . ipratropium (ATROVENT) nebulizer solution 0.5 mg    Sig:   . albuterol-ipratropium (COMBIVENT) 18-103 MCG/ACT inhaler    Sig: Inhale 2 puffs into the lungs every 4 (four) hours as needed for wheezing or shortness of breath.    Dispense:  1 Inhaler    Refill:  0    Order Specific Question:  Supervising Provider    Answer:  Eber Hong D [[id number]]  . Spacer/Aero-Holding Chambers (AEROCHAMBER PLUS WITH MASK) inhaler    Sig: Use as instructed    Dispense:  1 each    Refill:  2    Order Specific Question:  Supervising Provider    Answer:  Eber Hong D [[id number]]  . guaiFENesin (MUCINEX) 600 MG 12 hr tablet    Sig: Take 1 tablet (600 mg total) by mouth 2 (two) times daily as needed for cough or to loosen phlegm.    Dispense:  8 tablet    Refill:  0    Order Specific Question:   Supervising Provider    Answer:  Eber Hong D [[id number]]  . fluticasone (FLONASE) 50 MCG/ACT nasal spray    Sig: Place 2 sprays into both nostrils daily.    Dispense:  16 g    Refill:  0    Order Specific Question:  Supervising Provider    Answer:  Eber Hong D [[id number]]  . predniSONE (DELTASONE) 20 MG tablet    Sig: 3 tabs po daily x 4 days    Dispense:  12 tablet    Refill:  0    Order Specific Question:  Supervising Provider    Answer:  Eber Hong D [[id number]]      Anne Petrey Butler Denmark Camprubi-Soms, PA-C 11/25/13 2357

## 2013-11-26 MED ORDER — IPRATROPIUM-ALBUTEROL 20-100 MCG/ACT IN AERS: 1.0000 | INHALATION_SPRAY | Freq: Four times a day (QID) | RESPIRATORY_TRACT | Status: DC | PRN

## 2013-11-26 NOTE — Progress Notes (Signed)
  CARE MANAGEMENT ED NOTE 11/26/2013  Patient:  Anne [name],Anne [name]   Account Number:  [id number]  Date Initiated:  11/26/2013  Documentation initiated by:  Edd ArbourGIBBS,KIMBERLY  Subjective/Objective Assessment:   33 year old bcbs [place] [facility] and d/c with Rx for combivent 18/100 [facility] requesting permission for change to 20/100 combievent     Subjective/Objective Assessment Detail:   [name] states pt states she will return to     Action/Plan:   ED CM [facility] [place] to assist with med change for pt ED CM reviewed EPIC ED [facility] for assist with med change after speaking with Anne [name] Transferred to Dr   Action/Plan Detail:   Anne [name] EDP Reviewed with Dr Anne [name] pt med changes need Dr Anne [name] to assist Cm faxed new Rx to [phone] to Anne [name] with fax confirmation at 1127 11/26/13   Anticipated DC Date:  11/25/2013     Status Recommendation to Physician:   Result of Recommendation:    Other ED Services  Consult Working Plan    DC Planning Services  Outpatient Services - Pt will follow up  Other    Choice offered to / List presented to:            Status of service:  Completed, signed off  ED Comments:   ED Comments Detail:  11/26/13 1144 CM dialed work number [phone] Dwayne answered States pt was not in at this time but would take a message Cm left message that pt can return to pharmacy Rx ready 1143 Cm di [phone] listed in EPIC as home number --automated recording  stated system states number not valid

## 2013-11-27 NOTE — ED Provider Notes (Signed)
Medical screening examination/treatment/procedure(s) were conducted as a shared visit with non-physician practitioner(s) and myself.  I personally evaluated the patient during the encounter.   EKG Interpretation None       Patient with bronchospasm, likely in the setting of a URI. Will treat symptomatically, chest x-ray negative for pneumonia. At this time she has no respiratory distress is likely to be discharged.  Anne CamelScott T Kito Cuffe, MD 11/27/13 [phone]

## 2014-06-22 ENCOUNTER — Encounter (HOSPITAL_COMMUNITY): Payer: Self-pay | Admitting: Emergency Medicine

## 2014-06-22 ENCOUNTER — Emergency Department (INDEPENDENT_AMBULATORY_CARE_PROVIDER_SITE_OTHER)
Admission: EM | Admit: 2014-06-22 | Discharge: 2014-06-22 | Disposition: A | Discharge status: Home / Self Care | Payer: BLUE CROSS/BLUE SHIELD | Source: Home / Self Care | Attending: Family Medicine | Admitting: Family Medicine

## 2014-06-22 DIAGNOSIS — I10 Essential (primary) hypertension: Secondary | ICD-10-CM

## 2014-06-22 DIAGNOSIS — Z720 Tobacco use: Secondary | ICD-10-CM

## 2014-06-22 DIAGNOSIS — L03116 Cellulitis of left lower limb: Secondary | ICD-10-CM

## 2014-06-22 HISTORY — DX: Unspecified asthma, uncomplicated: J45.909

## 2014-06-22 MED ORDER — CLINDAMYCIN HCL 300 MG PO CAPS: 300.0000 mg | ORAL_CAPSULE | Freq: Three times a day (TID) | ORAL | Status: DC

## 2014-06-22 NOTE — Discharge Instructions (Signed)
You have a skin infection with bacteria. This will need antibiotics to clear Please take them to completion Please use benadryl for the itch Please take ibuprofen 600mg  every 6 hours Please come back in 1 day if you do not get better.

## 2014-06-22 NOTE — ED Notes (Signed)
Patient c/o insect bite on LLE x 3 places onset yesterday. Patient reports trying a baking soda combination which did not help only burned. Patient is in NAD.

## 2014-06-22 NOTE — ED Provider Notes (Signed)
CSN: [id number]     Arrival date & time 06/22/14  1549 History   First MD Initiated Contact with Patient 06/22/14 1716     Chief Complaint  Patient presents with  . Insect Bite   (Consider location/radiation/quality/duration/timing/severity/associated sxs/prior Treatment) HPI   L leg spider bites. Started yesterday. Itchy. 4 red spots on leg. Did not see anything actually bite her. Noticed spots when standing out back at work at [employer]. Constant. Getting worse. Very painful. Tried benadryl w/ some benefit. Aleve w/o benefit. Baking soda w/o benefit. Associated w/ HA. Denies fevers, sob, palpitations, CP, syncope, malaise, dysuria frequency, abd pain.   Past Medical History  Diagnosis Date  . Asthma    Past Surgical History  Procedure Laterality Date  . Ectopic pregnancy surgery    . Mandible surgery    . Tubal ligation     Family History  Problem Relation Age of Onset  . Hypertension Father   . Thyroid disease Mother   . Diabetes Maternal Grandmother   . Thyroid disease Maternal Grandmother   . Diabetes Maternal Aunt   . Diabetes Maternal Aunt   . Kidney disease Maternal Aunt    History  Substance Use Topics  . Smoking status: Current Every Day Smoker -- 1.00 packs/day for 10 years    Types: Cigarettes  . Smokeless tobacco: Never Used  . Alcohol Use: 0.6 oz/week    1 Glasses of wine per week     Comment: occasion   OB History    No data available     Review of Systems Per HPI with all other pertinent systems negative.   Allergies  Food allergy formula  Home Medications   Prior to Admission medications   Medication Sig Start Date End Date Taking? Authorizing Provider  albuterol (PROVENTIL HFA;VENTOLIN HFA) 108 (90 BASE) MCG/ACT inhaler Inhale 2 puffs into the lungs every 6 (six) hours as needed for wheezing or shortness of breath.    Historical Provider, MD  albuterol-ipratropium (COMBIVENT) 18-103 MCG/ACT inhaler Inhale 2 puffs into the lungs every 4 (four)  hours as needed for wheezing or shortness of breath. 11/25/13   Mercedes Strupp Camprubi-Soms, PA-C  clindamycin (CLEOCIN) 300 MG capsule Take 1 capsule (300 mg total) by mouth 3 (three) times daily. 06/22/14   Ozella Rocks, MD  fluticasone (FLONASE) 50 MCG/ACT nasal spray Place 2 sprays into both nostrils daily. 11/25/13   Mercedes Strupp Camprubi-Soms, PA-C  guaiFENesin (MUCINEX) 600 MG 12 hr tablet Take 1 tablet (600 mg total) by mouth 2 (two) times daily as needed for cough or to loosen phlegm. 11/25/13   Mercedes Strupp Camprubi-Soms, PA-C  Ipratropium-Albuterol (COMBIVENT RESPIMAT) 20-100 MCG/ACT AERS respimat Inhale 1 puff into the lungs every 6 (six) hours as needed for wheezing. 11/26/13   Elwin Mocha, MD  Phenyleph-Doxylamine-DM-APAP (ALKA-SELTZER PLUS COLD & FLU) 10-12.5-20-650 MG PACK Take 1 Package by mouth every 4 (four) hours as needed (cold symptoms).    Historical Provider, MD  predniSONE (DELTASONE) 20 MG tablet 3 tabs po daily x 4 days 11/25/13   Mercedes Strupp Camprubi-Soms, PA-C  Spacer/Aero-Holding Chambers (AEROCHAMBER PLUS WITH MASK) inhaler Use as instructed 11/25/13   Donnita Falls Camprubi-Soms, PA-C   BP 148/96 mmHg  Pulse 81  Temp(Src) 98.2 F (36.8 C) (Oral)  Resp 20  SpO2 100%  LMP 06/01/2014 (Exact Date) Physical Exam  Constitutional: She is oriented to person, place, and time. She appears well-developed and well-nourished.  HENT:  Head: Normocephalic and atraumatic.  Eyes: EOM  are normal. Pupils are equal, round, and reactive to light.  Neck: Normal range of motion.  Cardiovascular: Normal rate.   No murmur heard. Pulmonary/Chest: Effort normal and breath sounds normal.  Abdominal: Bowel sounds are normal.  Musculoskeletal: Normal range of motion.  Neurological: She is oriented to person, place, and time.  Skin: Skin is warm.  Posterior calf with 4 x 7 cm area of erythema and induration. No central fluctuance or abscess noted. 2 other smaller 1 cm lesions on the  dorsum of the foot and posterior ankle tender to palpation.  Psychiatric: She has a normal mood and affect. Her behavior is normal.        ED Course  Procedures (including critical care time) Labs Review Labs Reviewed - No data to display  Imaging Review No results found.   MDM   1. Cellulitis of left lower extremity   2. Tobacco abuse   3. Essential hypertension    Synovitis of the left leg. Start clindamycin. Benadryl for itch. NSAIDs. Patient follow-up in 24 hours if not improving. Discussed tobacco cessation. Patient motivated and will attempt. No previous history of hypertension but patient without a PCP. Encouraged patient to follow-up if blood pressure remains elevated.  Precautions given and all questions answered  Shelly Flattenavid Merrell, MD Family Medicine 06/22/2014, 5:29 PM      Ozella Rocksavid J Merrell, MD 06/22/14 [phone]

## 2014-07-04 ENCOUNTER — Encounter (HOSPITAL_COMMUNITY): Payer: Self-pay | Admitting: *Deleted

## 2014-07-04 ENCOUNTER — Emergency Department (HOSPITAL_COMMUNITY)
Admission: EM | Admit: 2014-07-04 | Discharge: 2014-07-04 | Disposition: A | Discharge status: Home / Self Care | Payer: BLUE CROSS/BLUE SHIELD | Attending: Emergency Medicine | Admitting: Emergency Medicine

## 2014-07-04 DIAGNOSIS — Z7951 Long term (current) use of inhaled steroids: Secondary | ICD-10-CM | POA: Insufficient documentation

## 2014-07-04 DIAGNOSIS — M79602 Pain in left arm: Secondary | ICD-10-CM | POA: Diagnosis present

## 2014-07-04 DIAGNOSIS — J45909 Unspecified asthma, uncomplicated: Secondary | ICD-10-CM | POA: Insufficient documentation

## 2014-07-04 DIAGNOSIS — L03114 Cellulitis of left upper limb: Secondary | ICD-10-CM | POA: Diagnosis not present

## 2014-07-04 DIAGNOSIS — Z79899 Other long term (current) drug therapy: Secondary | ICD-10-CM | POA: Insufficient documentation

## 2014-07-04 DIAGNOSIS — Z792 Long term (current) use of antibiotics: Secondary | ICD-10-CM | POA: Insufficient documentation

## 2014-07-04 DIAGNOSIS — Z720 Tobacco use: Secondary | ICD-10-CM | POA: Insufficient documentation

## 2014-07-04 MED ORDER — CETIRIZINE HCL 10 MG PO TABS: 10.0000 mg | ORAL_TABLET | Freq: Every day | ORAL | Status: DC

## 2014-07-04 MED ORDER — DIPHENHYDRAMINE HCL 25 MG PO TABS: 25.0000 mg | ORAL_TABLET | Freq: Four times a day (QID) | ORAL | Status: DC | PRN

## 2014-07-04 MED ORDER — CEPHALEXIN 500 MG PO CAPS: 500.0000 mg | ORAL_CAPSULE | Freq: Four times a day (QID) | ORAL | Status: DC

## 2014-07-04 NOTE — ED Provider Notes (Signed)
CSN: [id number]     Arrival date & time 07/04/14  0032 History   First MD Initiated Contact with Patient 07/04/14 [phone]     Chief Complaint  Patient presents with  . Arm Pain     (Consider location/radiation/quality/duration/timing/severity/associated sxs/prior Treatment) HPI Comments: Patient is a 34 year old female past medical history significant for asthma, tobacco abuse presenting to the emergency department for left arm pain. Patient states she noticed yesterday morning that she had some itching and redness to her left upper arm she has had continued worsening redness and swelling since then. She also endorses increased pain. She states she tried to take Benadryl with no improvement. She states she has similar area on her left calf, was seen in urgent care and given an antibiotic with improvement of the redness and swelling, endorses continued itching. Denies any known injury, insect bites, etc. Denies any history of IV drug abuse.   Past Medical History  Diagnosis Date  . Asthma    Past Surgical History  Procedure Laterality Date  . Ectopic pregnancy surgery    . Mandible surgery    . Tubal ligation     Family History  Problem Relation Age of Onset  . Hypertension Father   . Thyroid disease Mother   . Diabetes Maternal Grandmother   . Thyroid disease Maternal Grandmother   . Diabetes Maternal Aunt   . Diabetes Maternal Aunt   . Kidney disease Maternal Aunt    History  Substance Use Topics  . Smoking status: Current Every Day Smoker -- 1.00 packs/day for 10 years    Types: Cigarettes  . Smokeless tobacco: Never Used  . Alcohol Use: 0.6 oz/week    1 Glasses of wine per week     Comment: occasion   OB History    No data available     Review of Systems  Constitutional: Negative for fever and chills.  Skin: Positive for color change.  All other systems reviewed and are negative.     Allergies  Food allergy formula  Home Medications   Prior to Admission  medications   Medication Sig Start Date End Date Taking? Authorizing Provider  ibuprofen (ADVIL,MOTRIN) 200 MG tablet Take 800 mg by mouth every 8 (eight) hours as needed for moderate pain.   Yes Historical Provider, MD  albuterol (PROVENTIL HFA;VENTOLIN HFA) 108 (90 BASE) MCG/ACT inhaler Inhale 2 puffs into the lungs every 6 (six) hours as needed for wheezing or shortness of breath.    Historical Provider, MD  albuterol-ipratropium (COMBIVENT) 18-103 MCG/ACT inhaler Inhale 2 puffs into the lungs every 4 (four) hours as needed for wheezing or shortness of breath. 11/25/13   Mercedes Camprubi-Soms, PA-C  clindamycin (CLEOCIN) 300 MG capsule Take 1 capsule (300 mg total) by mouth 3 (three) times daily. Patient not taking: Reported on 07/04/2014 06/22/14   Ozella Rocks, MD  fluticasone [facility]) 50 MCG/ACT nasal spray Place 2 sprays into both nostrils daily. Patient not taking: Reported on 07/04/2014 11/25/13   Mercedes Camprubi-Soms, PA-C  guaiFENesin (MUCINEX) 600 MG 12 hr tablet Take 1 tablet (600 mg total) by mouth 2 (two) times daily as needed for cough or to loosen phlegm. Patient not taking: Reported on 07/04/2014 11/25/13   Mercedes Camprubi-Soms, PA-C  Ipratropium-Albuterol (COMBIVENT RESPIMAT) 20-100 MCG/ACT AERS respimat Inhale 1 puff into the lungs every 6 (six) hours as needed for wheezing. 11/26/13   Elwin Mocha, MD  predniSONE (DELTASONE) 20 MG tablet 3 tabs po daily x 4 days Patient not  taking: Reported on 07/04/2014 11/25/13   Mercedes Camprubi-Soms, PA-C  Spacer/Aero-Holding Chambers (AEROCHAMBER PLUS WITH MASK) inhaler Use as instructed Patient not taking: Reported on 07/04/2014 11/25/13   Mercedes Camprubi-Soms, PA-C   BP 139/87 mmHg  Pulse 73  Temp(Src) 98.5 F (36.9 C) (Oral)  Resp 18  Ht 5\' 11"  (1.803 m)  Wt 244 lb (110.678 kg)  BMI 34.05 kg/m2  SpO2 100%  LMP 06/30/2014 Physical Exam  Constitutional: She is oriented to person, place, and time. She appears well-developed and  well-nourished. No distress.  HENT:  Head: Normocephalic and atraumatic.  Right Ear: External ear normal.  Left Ear: External ear normal.  Nose: Nose normal.  Mouth/Throat: Oropharynx is clear and moist.  Eyes: Conjunctivae are normal.  Neck: Normal range of motion. Neck supple.  Cardiovascular: Normal rate, regular rhythm, normal heart sounds and intact distal pulses.   Pulmonary/Chest: Effort normal.  Abdominal: Soft.  Musculoskeletal: Normal range of motion.  Neurological: She is alert and oriented to person, place, and time. She has normal strength. No sensory deficit. GCS eye subscore is 4. GCS verbal subscore is 5. GCS motor subscore is 6.  Skin: Skin is warm and dry. She is not diaphoretic. There is erythema.     Psychiatric: She has a normal mood and affect.  Nursing note and vitals reviewed.   ED Course  Procedures (including critical care time) Medications - No data to display  Labs Review Labs Reviewed - No data to display  Imaging Review No results found.   EKG Interpretation None      MDM   Final diagnoses:  Left arm cellulitis    Filed Vitals:   07/04/14 0653  BP: 137/87  Pulse: 81  Temp: 98.7 F (37.1 C)  Resp:    Afebrile, NAD, non-toxic appearing, AAOx4.  Neurovascularly intact. Normal sensation. No evidence of compartment syndrome. Suspect uncomplicated cellulitis based on limited area of involvement, minimal pain, no systemic signs of illness (eg, fever, chills, dehydration, altered mental status, tachypnea, tachycardia, hypotension), no risk factors for serious illness (eg, extremes of age, general debility, immunocompromised status).  PE reveals redness, swelling, mildly tender, warm to touch. Skin intact, No bleeding. No bullae. Non purulent. Non circumferential.  Borders are not elevated or sharply demarcated. Return precautions discussed. Patient is agreeable to plan. Patient is stable at time of discharge       Francee PiccoloJennifer Conn Trombetta,  PA-C 07/04/14 1251  Marisa Severinlga Otter, MD 07/05/14 [phone]

## 2014-07-04 NOTE — Discharge Instructions (Signed)
Please follow up with your primary care physician in 1-2 days. If you do not have one please call the [facility] number listed above. Please take your antibiotic until completion. Please read all discharge instructions and return precautions.  ° °Cellulitis °Cellulitis is an infection of the skin and the tissue beneath it. The infected area is usually red and tender. Cellulitis occurs most often in the arms and lower legs.  °CAUSES  °Cellulitis is caused by bacteria that enter the skin through cracks or cuts in the skin. The most common types of bacteria that cause cellulitis are staphylococci and streptococci. °SIGNS AND SYMPTOMS  °· Redness and warmth. °· Swelling. °· Tenderness or pain. °· Fever. °DIAGNOSIS  °Your health care provider can usually determine what is wrong based on a physical exam. Blood tests may also be done. °TREATMENT  °Treatment usually involves taking an antibiotic medicine. °HOME CARE INSTRUCTIONS  °· Take your antibiotic medicine as directed by your health care provider. Finish the antibiotic even if you start to feel better. °· Keep the infected arm or leg elevated to reduce swelling. °· Apply a warm cloth to the affected area up to 4 times per day to relieve pain. °· Take medicines only as directed by your health care provider. °· Keep all follow-up visits as directed by your health care provider. °SEEK MEDICAL CARE IF:  °· You notice red streaks coming from the infected area. °· Your red area gets larger or turns dark in color. °· Your bone or joint underneath the infected area becomes painful after the skin has healed. °· Your infection returns in the same area or another area. °· You notice a swollen bump in the infected area. °· You develop new symptoms. °· You have a fever. °SEEK IMMEDIATE MEDICAL CARE IF:  °· You feel very sleepy. °· You develop vomiting or diarrhea. °· You have a general ill feeling (malaise) with muscle aches and pains. °MAKE SURE YOU:   °· Understand these instructions. °· Will watch your condition. °· Will get help right away if you are not doing well or get worse. °Document Released: 01/17/2005 Document Revised: 08/24/2013 Document Reviewed: 06/25/2011 °[employer]® Patient Information ©2015 [employer]. This information is not intended to replace advice given to you by your health care provider. Make sure you discuss any questions you have with your health care provider. ° ° ° °

## 2014-07-04 NOTE — ED Notes (Signed)
The pt has redness and swelling to her lt upper arm.  It appears to be a giant hive.  She has \\increasing  pain.  2 weeks ago she had a similar lesion to  Her lt calf and she was seen at urgent care and was told that she had cellulitis and was given  An antibiotic.  lmp 3 days ago

## 2014-07-15 ENCOUNTER — Encounter: Payer: Self-pay | Admitting: Internal Medicine

## 2014-07-15 ENCOUNTER — Ambulatory Visit: Payer: BLUE CROSS/BLUE SHIELD | Attending: Internal Medicine | Admitting: Internal Medicine

## 2014-07-15 VITALS — BP 153/100 | HR 78 | Temp 98.0°F | Resp 16 | Ht 71.0 in | Wt 260.0 lb

## 2014-07-15 DIAGNOSIS — N760 Acute vaginitis: Secondary | ICD-10-CM

## 2014-07-15 DIAGNOSIS — R011 Cardiac murmur, unspecified: Secondary | ICD-10-CM | POA: Insufficient documentation

## 2014-07-15 DIAGNOSIS — R202 Paresthesia of skin: Secondary | ICD-10-CM | POA: Insufficient documentation

## 2014-07-15 DIAGNOSIS — R030 Elevated blood-pressure reading, without diagnosis of hypertension: Secondary | ICD-10-CM

## 2014-07-15 DIAGNOSIS — I10 Essential (primary) hypertension: Secondary | ICD-10-CM | POA: Insufficient documentation

## 2014-07-15 DIAGNOSIS — M7989 Other specified soft tissue disorders: Secondary | ICD-10-CM | POA: Insufficient documentation

## 2014-07-15 DIAGNOSIS — IMO0001 Reserved for inherently not codable concepts without codable children: Secondary | ICD-10-CM

## 2014-07-15 DIAGNOSIS — M25561 Pain in right knee: Secondary | ICD-10-CM

## 2014-07-15 DIAGNOSIS — J302 Other seasonal allergic rhinitis: Secondary | ICD-10-CM

## 2014-07-15 LAB — CBC WITH DIFFERENTIAL/PLATELET
Basophils Absolute: 0 10*3/uL (ref 0.0–0.1)
Basophils Relative: 0 % (ref 0–1)
Eosinophils Absolute: 0.3 10*3/uL (ref 0.0–0.7)
Eosinophils Relative: 3 % (ref 0–5)
HCT: 39 % (ref 36.0–46.0)
Hemoglobin: 13.1 g/dL (ref 12.0–15.0)
LYMPHS ABS: 2.6 10*3/uL (ref 0.7–4.0)
LYMPHS PCT: 30 % (ref 12–46)
MCH: 32 pg (ref 26.0–34.0)
MCHC: 33.6 g/dL (ref 30.0–36.0)
MCV: 95.1 fL (ref 78.0–100.0)
MONOS PCT: 6 % (ref 3–12)
MPV: 9.8 fL (ref 8.6–12.4)
Monocytes Absolute: 0.5 10*3/uL (ref 0.1–1.0)
NEUTROS ABS: 5.2 10*3/uL (ref 1.7–7.7)
Neutrophils Relative %: 61 % (ref 43–77)
Platelets: 246 10*3/uL (ref 150–400)
RBC: 4.1 MIL/uL (ref 3.87–5.11)
RDW: 13.7 % (ref 11.5–15.5)
WBC: 8.6 10*3/uL (ref 4.0–10.5)

## 2014-07-15 LAB — COMPLETE METABOLIC PANEL WITH GFR
ALBUMIN: 4.1 g/dL (ref 3.5–5.2)
AST: 14 U/L (ref 0–37)
Alkaline Phosphatase: 62 U/L (ref 39–117)
BUN: 12 mg/dL (ref 6–23)
CHLORIDE: 106 meq/L (ref 96–112)
CO2: 29 mEq/L (ref 19–32)
Calcium: 9 mg/dL (ref 8.4–10.5)
Creat: 0.96 mg/dL (ref 0.50–1.10)
GFR, EST NON AFRICAN AMERICAN: 77 mL/min
GFR, Est African American: 89 mL/min
Glucose, Bld: 83 mg/dL (ref 70–99)
POTASSIUM: 4.9 meq/L (ref 3.5–5.3)
Sodium: 139 mEq/L (ref 135–145)
TOTAL PROTEIN: 6.8 g/dL (ref 6.0–8.3)
Total Bilirubin: 0.4 mg/dL (ref 0.2–1.2)

## 2014-07-15 LAB — POCT GLYCOSYLATED HEMOGLOBIN (HGB A1C): HEMOGLOBIN A1C: 5.6

## 2014-07-15 LAB — GLUCOSE, POCT (MANUAL RESULT ENTRY): POC Glucose: 112 mg/dl — AB (ref 70–99)

## 2014-07-15 MED ORDER — FLUCONAZOLE 150 MG PO TABS: 150.0000 mg | ORAL_TABLET | Freq: Once | ORAL | Status: DC

## 2014-07-15 MED ORDER — HYDROCHLOROTHIAZIDE 12.5 MG PO TABS: 12.5000 mg | ORAL_TABLET | Freq: Every day | ORAL | Status: DC

## 2014-07-15 NOTE — Progress Notes (Signed)
Patient ID: Anne [name], female   DOB: [birth year], 34 y.o.   MRN: [id number]  YQM:[id number]:[id number]  BMW:[id number]  DOB - [birth year]  CC:  Chief Complaint  Patient presents with  . Establish Care       HPI: Anne [name] is a 34 y.o. female here today to establish medical care. She has a past medical history of asthma.  Patient was seen in the ER 11 days ago for LUE cellulitis.  The patient was given Clindamycin but states that she did not complete the medication because she began to have vaginal itching. She reports that since then she has had resolution of cellulitis.  She reports LUQ pain-- for 3 days. Sharp pain. Radiates to her middle back. Pain is aggravated after eating. Pain usually 15 minutes after any food intake. Some nausea, no vomiting.  She c/o of right hand numbness and color changes of index finger. She has pain in her right knee. She reports that she had fluid drained off her knee 2 years ago in which rheumatoid was ruled out. The pain is aggravated by walking, feels as if the knee is unstable. Swelling in her ankles and bilateral hands. Family history of diabetes.  Some SOB when going outside. Feels congested and throat feels dry.   Patient has No headache, No chest pain, No abdominal pain - No Nausea, No new weakness tingling or numbness, No Cough - SOB.  Allergies  Allergen Reactions  . Food Allergy Formula Anaphylaxis and Swelling    coconut   Past Medical History  Diagnosis Date  . Asthma    Current Outpatient Prescriptions on File Prior to Visit  Medication Sig Dispense Refill  . albuterol (PROVENTIL HFA;VENTOLIN HFA) 108 (90 BASE) MCG/ACT inhaler Inhale 2 puffs into the lungs every 6 (six) hours as needed for wheezing or shortness of breath.    Marland Kitchen. albuterol-ipratropium (COMBIVENT) 18-103 MCG/ACT inhaler Inhale 2 puffs into the lungs every 4 (four) hours as needed for wheezing or shortness of breath. (Patient not taking: Reported on 07/15/2014) 1 Inhaler 0  .  cephALEXin (KEFLEX) 500 MG capsule Take 1 capsule (500 mg total) by mouth 4 (four) times daily. (Patient not taking: Reported on 07/15/2014) 40 capsule 0  . cetirizine (ZYRTEC ALLERGY) 10 MG tablet Take 1 tablet (10 mg total) by mouth daily. (Patient not taking: Reported on 07/15/2014) 30 tablet 1  . clindamycin (CLEOCIN) 300 MG capsule Take 1 capsule (300 mg total) by mouth 3 (three) times daily. (Patient not taking: Reported on 07/04/2014) 21 capsule 0  . diphenhydrAMINE (BENADRYL) 25 MG tablet Take 1 tablet (25 mg total) by mouth every 6 (six) hours as needed for itching (Rash). (Patient not taking: Reported on 07/15/2014) 30 tablet 0  . fluticasone (FLONASE) 50 MCG/ACT nasal spray Place 2 sprays into both nostrils daily. (Patient not taking: Reported on 07/04/2014) 16 g 0  . guaiFENesin (MUCINEX) 600 MG 12 hr tablet Take 1 tablet (600 mg total) by mouth 2 (two) times daily as needed for cough or to loosen phlegm. (Patient not taking: Reported on 07/04/2014) 8 tablet 0  . ibuprofen (ADVIL,MOTRIN) 200 MG tablet Take 800 mg by mouth every 8 (eight) hours as needed for moderate pain.    . Ipratropium-Albuterol (COMBIVENT RESPIMAT) 20-100 MCG/ACT AERS respimat Inhale 1 puff into the lungs every 6 (six) hours as needed for wheezing. (Patient not taking: Reported on 07/15/2014) 4 g 2  . predniSONE (DELTASONE) 20 MG tablet 3 tabs po daily x 4 days (Patient  not taking: Reported on 07/04/2014) 12 tablet 0  . Spacer/Aero-Holding Chambers (AEROCHAMBER PLUS WITH MASK) inhaler Use as instructed (Patient not taking: Reported on 07/04/2014) 1 each 2   No current facility-administered medications on file prior to visit.   Family History  Problem Relation Age of Onset  . Hypertension Father   . Thyroid disease Mother   . Diabetes Maternal Grandmother   . Thyroid disease Maternal Grandmother   . Diabetes Maternal Aunt   . Diabetes Maternal Aunt   . Kidney disease Maternal Aunt    History   Social History  .  Marital Status: Legally Separated    Spouse Name: N/A  . Number of Children: N/A  . Years of Education: N/A   Occupational History  . Not on file.   Social History Main Topics  . Smoking status: Current Every Day Smoker -- 1.00 packs/day for 10 years    Types: Cigarettes  . Smokeless tobacco: Never Used  . Alcohol Use: 0.6 oz/week    1 Glasses of wine per week     Comment: occasion  . Drug Use: No  . Sexual Activity: Yes    Birth Control/ Protection: None   Other Topics Concern  . Not on file   Social History Narrative    Review of Systems: See HPI  Objective:   Filed Vitals:   07/15/14 1510  BP: 141/90  Pulse: 94  Temp: 98 F (36.7 C)  Resp: 16    Physical Exam  HENT:  Right Ear: External ear normal.  Left Ear: External ear normal.  Eyes: EOM are normal. Pupils are equal, round, and reactive to light. Right eye exhibits no discharge. Left eye exhibits no discharge.  Cardiovascular: Normal rate and regular rhythm.   Murmur heard. Pulmonary/Chest: Effort normal and breath sounds normal.  Musculoskeletal: Normal range of motion. She exhibits no edema (none seen).  Lymphadenopathy:    She has no cervical adenopathy.  Neurological: She is alert.  Skin: Skin is warm and dry.     Lab Results  Component Value Date   WBC 5.2 06/29/2013   HGB 13.9 06/29/2013   HCT 40.1 06/29/2013   MCV 91.1 06/29/2013   PLT 228 06/29/2013   Lab Results  Component Value Date   CREATININE 0.92 06/29/2013   BUN 10 06/29/2013   NA 141 06/29/2013   K 3.5* 06/29/2013   CL 106 06/29/2013   CO2 19 06/29/2013    No results found for: HGBA1C Lipid Panel  No results found for: CHOL, TRIG, HDL, CHOLHDL, VLDL, LDLCALC     Assessment and plan:   Anne [name] was seen today for establish care.  Diagnoses and all orders for this visit:  Murmur, heart--new Orders: -     CBC with Differential -     2D Echocardiogram without contrast; Future  Tingling Orders: -     POCT  glucose (manual entry) -     POCT glycosylated hemoglobin (Hb A1C) -     COMPLETE METABOLIC PANEL WITH GFR -     TSH -     Vitamin D, 25-hydroxy -     Vitamin B12  Vaginitis Orders: -     Begin (DIFLUCAN) 150 MG tablet; Take 1 tablet (150 mg total) by mouth once. May repeat in one week As a result of antibiotic use   Swelling of both lower extremities/Swelling of both hands Will r/o heart condition with Echo   Elevated BP -     Begin hydrochlorothiazide (HYDRODIURIL)  12.5 MG tablet; Take 1 tablet (12.5 mg total) by mouth daily. Review of chart reveals several elevated pressures. Will begin low dose diuretic to help with swelling as well    Return in about 2 weeks (around 07/29/2014) for BP check-RN .    Holland Commons, NP-C [facility] [phone] 07/15/2014, 3:32 PM

## 2014-07-15 NOTE — Progress Notes (Signed)
Patient here to establish care Patient recently took abx for cellulitis but did not finish them because her vaginal area started to itch Patient complaining of shortness of breath when she goes outside Patient complains of left lower rib area pain that radiates to her back

## 2014-07-16 LAB — VITAMIN B12: Vitamin B-12: 307 pg/mL (ref 211–911)

## 2014-07-16 LAB — TSH: TSH: 0.324 u[IU]/mL — ABNORMAL LOW (ref 0.350–4.500)

## 2014-07-16 LAB — VITAMIN D 25 HYDROXY (VIT D DEFICIENCY, FRACTURES): VIT D 25 HYDROXY: 7 ng/mL — AB (ref 30–100)

## 2014-07-20 ENCOUNTER — Ambulatory Visit (HOSPITAL_COMMUNITY)
Admission: RE | Admit: 2014-07-20 | Discharge: 2014-07-20 | Disposition: A | Discharge status: Home / Self Care | Payer: BLUE CROSS/BLUE SHIELD | Source: Ambulatory Visit | Attending: Internal Medicine | Admitting: Internal Medicine

## 2014-07-20 ENCOUNTER — Other Ambulatory Visit (HOSPITAL_COMMUNITY): Payer: BLUE CROSS/BLUE SHIELD

## 2014-07-20 DIAGNOSIS — R011 Cardiac murmur, unspecified: Secondary | ICD-10-CM

## 2014-07-27 ENCOUNTER — Telehealth: Payer: Self-pay | Admitting: *Deleted

## 2014-07-27 MED ORDER — ERGOCALCIFEROL 1.25 MG (50000 UT) PO CAPS: 50000.0000 [IU] | ORAL_CAPSULE | ORAL | Status: DC

## 2014-07-27 NOTE — Telephone Encounter (Signed)
Pt is aware of her lab results.  

## 2014-07-27 NOTE — Telephone Encounter (Signed)
-----   Message from Ambrose FinlandValerie A Keck, NP sent at 07/22/2014 11:09 PM EDT ----- TSH is low, please place future order for patient to come back and repeat TSH and free T4 in 2 months. This is important to come back to evaluate her for thyroid disease.   Vitamin D is severely low. Please send drisdol 50,000 IU to take once weekly for 12 weeks. 12 tablets no refills. After she has completed all pills please tell her to begin taking a daily multivitamin to keep levels at a normal level. Thanks

## 2014-07-29 ENCOUNTER — Telehealth: Payer: Self-pay | Admitting: *Deleted

## 2014-07-29 NOTE — Telephone Encounter (Signed)
-----   Message from Ambrose FinlandValerie A Keck, NP sent at 07/28/2014  5:16 PM EDT ----- Murmur is benign, Echo was essential normal. No additional testing needed.

## 2014-07-29 NOTE — Telephone Encounter (Signed)
Pt is aware of her Echo findings.

## 2014-08-03 ENCOUNTER — Encounter: Payer: Self-pay | Admitting: *Deleted

## 2014-08-03 ENCOUNTER — Telehealth: Payer: Self-pay | Admitting: *Deleted

## 2014-08-03 NOTE — Telephone Encounter (Signed)
Patient called in wanting to be seen for "knots on both arms" that are pruritic.  She states she has taken Benadryl with no relief.  Advised her of walk in clinic hours for tomorrow and patient agreed to come.

## 2014-08-10 DIAGNOSIS — R030 Elevated blood-pressure reading, without diagnosis of hypertension: Secondary | ICD-10-CM | POA: Insufficient documentation

## 2014-08-10 DIAGNOSIS — R011 Cardiac murmur, unspecified: Secondary | ICD-10-CM | POA: Insufficient documentation

## 2014-08-10 NOTE — Telephone Encounter (Signed)
error 

## 2014-11-22 DIAGNOSIS — E0590 Thyrotoxicosis, unspecified without thyrotoxic crisis or storm: Secondary | ICD-10-CM

## 2014-11-22 HISTORY — DX: Thyrotoxicosis, unspecified without thyrotoxic crisis or storm: E05.90

## 2016-05-23 DIAGNOSIS — Z3040 Encounter for surveillance of contraceptives, unspecified: Secondary | ICD-10-CM | POA: Diagnosis not present

## 2016-05-23 DIAGNOSIS — Z113 Encounter for screening for infections with a predominantly sexual mode of transmission: Secondary | ICD-10-CM | POA: Diagnosis not present

## 2016-05-23 DIAGNOSIS — A6000 Herpesviral infection of urogenital system, unspecified: Secondary | ICD-10-CM | POA: Insufficient documentation

## 2016-05-23 DIAGNOSIS — Z01419 Encounter for gynecological examination (general) (routine) without abnormal findings: Secondary | ICD-10-CM | POA: Diagnosis not present

## 2016-06-07 ENCOUNTER — Other Ambulatory Visit (HOSPITAL_COMMUNITY): Payer: Self-pay | Admitting: Obstetrics and Gynecology

## 2016-06-07 DIAGNOSIS — N979 Female infertility, unspecified: Secondary | ICD-10-CM

## 2016-06-12 ENCOUNTER — Other Ambulatory Visit (HOSPITAL_COMMUNITY): Payer: Self-pay | Admitting: Obstetrics and Gynecology

## 2016-06-13 ENCOUNTER — Ambulatory Visit (HOSPITAL_COMMUNITY)
Admission: RE | Admit: 2016-06-13 | Discharge: 2016-06-13 | Disposition: A | Discharge status: Home / Self Care | Payer: BLUE CROSS/BLUE SHIELD | Source: Ambulatory Visit | Attending: Obstetrics and Gynecology | Admitting: Obstetrics and Gynecology

## 2016-06-13 ENCOUNTER — Encounter (HOSPITAL_COMMUNITY): Payer: Self-pay | Admitting: Radiology

## 2016-06-13 DIAGNOSIS — N971 Female infertility of tubal origin: Secondary | ICD-10-CM | POA: Insufficient documentation

## 2016-06-13 DIAGNOSIS — N979 Female infertility, unspecified: Secondary | ICD-10-CM | POA: Diagnosis not present

## 2016-06-13 MED ORDER — IOPAMIDOL (ISOVUE-300) INJECTION 61%
30.0000 mL | Freq: Once | INTRAVENOUS | Status: AC | PRN
  Administered 2016-06-13: 5 mL via ORAL

## 2016-06-27 DIAGNOSIS — N979 Female infertility, unspecified: Secondary | ICD-10-CM | POA: Diagnosis not present

## 2016-07-19 DIAGNOSIS — M216X1 Other acquired deformities of right foot: Secondary | ICD-10-CM | POA: Diagnosis not present

## 2016-07-19 DIAGNOSIS — M71572 Other bursitis, not elsewhere classified, left ankle and foot: Secondary | ICD-10-CM | POA: Diagnosis not present

## 2016-07-19 DIAGNOSIS — M7752 Other enthesopathy of left foot: Secondary | ICD-10-CM | POA: Diagnosis not present

## 2016-07-19 DIAGNOSIS — M71571 Other bursitis, not elsewhere classified, right ankle and foot: Secondary | ICD-10-CM | POA: Diagnosis not present

## 2016-07-19 DIAGNOSIS — M216X2 Other acquired deformities of left foot: Secondary | ICD-10-CM | POA: Diagnosis not present

## 2016-07-19 DIAGNOSIS — M7751 Other enthesopathy of right foot: Secondary | ICD-10-CM | POA: Diagnosis not present

## 2016-12-13 ENCOUNTER — Emergency Department (HOSPITAL_COMMUNITY): Payer: BLUE CROSS/BLUE SHIELD

## 2016-12-13 ENCOUNTER — Encounter (HOSPITAL_COMMUNITY): Payer: Self-pay | Admitting: Emergency Medicine

## 2016-12-13 ENCOUNTER — Inpatient Hospital Stay (HOSPITAL_COMMUNITY)
Admission: EM | Admit: 2016-12-13 | Discharge: 2016-12-17 | DRG: 645 | Disposition: A | Discharge status: Home / Self Care | Payer: BLUE CROSS/BLUE SHIELD | Attending: Internal Medicine | Admitting: Internal Medicine

## 2016-12-13 ENCOUNTER — Other Ambulatory Visit: Payer: Self-pay

## 2016-12-13 DIAGNOSIS — D649 Anemia, unspecified: Secondary | ICD-10-CM | POA: Diagnosis not present

## 2016-12-13 DIAGNOSIS — R0600 Dyspnea, unspecified: Secondary | ICD-10-CM | POA: Diagnosis not present

## 2016-12-13 DIAGNOSIS — Z91013 Allergy to seafood: Secondary | ICD-10-CM

## 2016-12-13 DIAGNOSIS — I361 Nonrheumatic tricuspid (valve) insufficiency: Secondary | ICD-10-CM | POA: Diagnosis not present

## 2016-12-13 DIAGNOSIS — R931 Abnormal findings on diagnostic imaging of heart and coronary circulation: Secondary | ICD-10-CM | POA: Diagnosis not present

## 2016-12-13 DIAGNOSIS — Z91018 Allergy to other foods: Secondary | ICD-10-CM

## 2016-12-13 DIAGNOSIS — J45909 Unspecified asthma, uncomplicated: Secondary | ICD-10-CM | POA: Diagnosis present

## 2016-12-13 DIAGNOSIS — R079 Chest pain, unspecified: Secondary | ICD-10-CM | POA: Diagnosis not present

## 2016-12-13 DIAGNOSIS — R0602 Shortness of breath: Secondary | ICD-10-CM | POA: Diagnosis not present

## 2016-12-13 DIAGNOSIS — F17210 Nicotine dependence, cigarettes, uncomplicated: Secondary | ICD-10-CM | POA: Diagnosis not present

## 2016-12-13 DIAGNOSIS — R609 Edema, unspecified: Secondary | ICD-10-CM | POA: Diagnosis not present

## 2016-12-13 DIAGNOSIS — R030 Elevated blood-pressure reading, without diagnosis of hypertension: Secondary | ICD-10-CM | POA: Diagnosis present

## 2016-12-13 DIAGNOSIS — R7989 Other specified abnormal findings of blood chemistry: Secondary | ICD-10-CM | POA: Diagnosis not present

## 2016-12-13 DIAGNOSIS — E0590 Thyrotoxicosis, unspecified without thyrotoxic crisis or storm: Secondary | ICD-10-CM | POA: Diagnosis present

## 2016-12-13 DIAGNOSIS — R9431 Abnormal electrocardiogram [ECG] [EKG]: Secondary | ICD-10-CM | POA: Diagnosis not present

## 2016-12-13 DIAGNOSIS — R0609 Other forms of dyspnea: Secondary | ICD-10-CM | POA: Diagnosis not present

## 2016-12-13 DIAGNOSIS — E0591 Thyrotoxicosis, unspecified with thyrotoxic crisis or storm: Secondary | ICD-10-CM | POA: Diagnosis not present

## 2016-12-13 DIAGNOSIS — E039 Hypothyroidism, unspecified: Secondary | ICD-10-CM | POA: Diagnosis present

## 2016-12-13 DIAGNOSIS — E010 Iodine-deficiency related diffuse (endemic) goiter: Secondary | ICD-10-CM | POA: Diagnosis not present

## 2016-12-13 LAB — CBC WITH DIFFERENTIAL/PLATELET
BASOS PCT: 0 %
Basophils Absolute: 0 10*3/uL (ref 0.0–0.1)
EOS ABS: 0.1 10*3/uL (ref 0.0–0.7)
EOS PCT: 2 %
HCT: 30.6 % — ABNORMAL LOW (ref 36.0–46.0)
Hemoglobin: 10.2 g/dL — ABNORMAL LOW (ref 12.0–15.0)
Lymphocytes Relative: 45 %
Lymphs Abs: 2.6 10*3/uL (ref 0.7–4.0)
MCH: 30.2 pg (ref 26.0–34.0)
MCHC: 33.3 g/dL (ref 30.0–36.0)
MCV: 90.5 fL (ref 78.0–100.0)
Monocytes Absolute: 0.3 10*3/uL (ref 0.1–1.0)
Monocytes Relative: 6 %
NEUTROS PCT: 47 %
Neutro Abs: 2.7 10*3/uL (ref 1.7–7.7)
PLATELETS: 245 10*3/uL (ref 150–400)
RBC: 3.38 MIL/uL — ABNORMAL LOW (ref 3.87–5.11)
RDW: 12.2 % (ref 11.5–15.5)
WBC: 5.8 10*3/uL (ref 4.0–10.5)

## 2016-12-13 LAB — COMPREHENSIVE METABOLIC PANEL
ALBUMIN: 3.2 g/dL — AB (ref 3.5–5.0)
ALK PHOS: 54 U/L (ref 38–126)
ALT: 29 U/L (ref 14–54)
AST: 32 U/L (ref 15–41)
Anion gap: 5 (ref 5–15)
BUN: 21 mg/dL — ABNORMAL HIGH (ref 6–20)
CALCIUM: 9 mg/dL (ref 8.9–10.3)
CHLORIDE: 109 mmol/L (ref 101–111)
CO2: 24 mmol/L (ref 22–32)
Creatinine, Ser: 0.59 mg/dL (ref 0.44–1.00)
GFR calc non Af Amer: >60 mL/min (ref >60)
Glucose, Bld: 121 mg/dL — ABNORMAL HIGH (ref 65–99)
POTASSIUM: 3.8 mmol/L (ref 3.5–5.1)
SODIUM: 138 mmol/L (ref 135–145)
Total Bilirubin: 0.4 mg/dL (ref 0.3–1.2)
Total Protein: 6.2 g/dL — ABNORMAL LOW (ref 6.5–8.1)

## 2016-12-13 LAB — POCT PREGNANCY, URINE: PREG TEST UR: NEGATIVE

## 2016-12-13 LAB — TSH

## 2016-12-13 LAB — D-DIMER, QUANTITATIVE: D-Dimer, Quant: 1.07 ug/mL-FEU — ABNORMAL HIGH (ref 0.00–0.50)

## 2016-12-13 LAB — POCT I-STAT TROPONIN I: TROPONIN I, POC: 0 ng/mL (ref 0.00–0.08)

## 2016-12-13 MED ORDER — PROPRANOLOL HCL 1 MG/ML IV SOLN
1.0000 mg | Freq: Once | INTRAVENOUS | Status: AC
  Administered 2016-12-14: 1 mg via INTRAVENOUS
  Filled 2016-12-13: qty 1

## 2016-12-13 MED ORDER — IOPAMIDOL (ISOVUE-370) INJECTION 76%
100.0000 mL | Freq: Once | INTRAVENOUS | Status: AC | PRN
Start: 2016-12-13 — End: 2016-12-13
  Administered 2016-12-13: 100 mL via INTRAVENOUS

## 2016-12-13 MED ORDER — PROPRANOLOL HCL 1 MG/ML IV SOLN: 1.0000 mg | Freq: Once | INTRAVENOUS | Status: DC

## 2016-12-13 MED ORDER — IOPAMIDOL (ISOVUE-370) INJECTION 76%
INTRAVENOUS | Status: AC
  Filled 2016-12-13: qty 100

## 2016-12-13 MED ORDER — SODIUM CHLORIDE 0.9 % IV BOLUS (SEPSIS)
1000.0000 mL | Freq: Once | INTRAVENOUS | Status: AC
  Administered 2016-12-13: 1000 mL via INTRAVENOUS

## 2016-12-13 NOTE — ED Provider Notes (Addendum)
[facility]-EMERGENCY DEPT Provider Note   CSN: [id number] Arrival date & time: 12/13/16  2004     History   Chief Complaint Chief Complaint  Patient presents with  . Shortness of Breath    HPI Anne [name] is a 36 y.o. female sent in to the ED from the urgent care for cp, shortness of breath and bilateral foot swelling. Patient states that this has been going on for about 2 days. Prior to this event. She has noticed swelling in her throat. She has had heat intolerance, tremulousness and anxiety. She denies history of DVT or PE. She states that the right leg is more swollen than her left. The urgent care, mentioned that she had a murmur today. She denies orthopnea or PND. She does have associated exertional dyspnea. She denies hemoptysis, recent surgery, confinement or trauma. She states that her mother and grandmother both had thyroid disease.   HPI  Past Medical History:  Diagnosis Date  . Asthma     Patient Active Problem List   Diagnosis Date Noted  . Murmur, heart 08/10/2014  . Elevated BP 08/10/2014    Past Surgical History:  Procedure Laterality Date  . ECTOPIC PREGNANCY SURGERY    . MANDIBLE SURGERY    . TUBAL LIGATION      OB History    Gravida Para Term Preterm AB Living   1             SAB TAB Ectopic Multiple Live Births                   Home Medications    Prior to Admission medications   Medication Sig Start Date End Date Taking? Authorizing Provider  albuterol (PROVENTIL HFA;VENTOLIN HFA) 108 (90 BASE) MCG/ACT inhaler Inhale 2 puffs into the lungs every 6 (six) hours as needed for wheezing or shortness of breath.    [provider]  albuterol-ipratropium (COMBIVENT) 18-103 MCG/ACT inhaler Inhale 2 puffs into the lungs every 4 (four) hours as needed for wheezing or shortness of breath. Patient not taking: Reported on 07/15/2014 11/25/13   Street, [place], PA-C  cephALEXin (KEFLEX) 500 MG capsule Take 1 capsule (500 mg total) by mouth 4 (four)  times daily. Patient not taking: Reported on 07/15/2014 07/04/14   Piepenbrink, Victorino Dike, PA-C  cetirizine (ZYRTEC ALLERGY) 10 MG tablet Take 1 tablet (10 mg total) by mouth daily. Patient not taking: Reported on 07/15/2014 07/04/14   Piepenbrink, Victorino Dike, PA-C  clindamycin (CLEOCIN) 300 MG capsule Take 1 capsule (300 mg total) by mouth 3 (three) times daily. Patient not taking: Reported on 07/04/2014 06/22/14   Ozella Rocks, MD  diphenhydrAMINE (BENADRYL) 25 MG tablet Take 1 tablet (25 mg total) by mouth every 6 (six) hours as needed for itching (Rash). Patient not taking: Reported on 07/15/2014 07/04/14   Piepenbrink, Victorino Dike, PA-C  ergocalciferol (VITAMIN D2) 50000 UNITS capsule Take 1 capsule (50,000 Units total) by mouth once a week. 07/27/14   Ambrose Finland, NP  fluconazole (DIFLUCAN) 150 MG tablet Take 1 tablet (150 mg total) by mouth once. May repeat in one week 07/15/14   Ambrose Finland, NP  fluticasone [facility]) 50 MCG/ACT nasal spray Place 2 sprays into both nostrils daily. Patient not taking: Reported on 07/04/2014 11/25/13   Street, [place], PA-C  guaiFENesin (MUCINEX) 600 MG 12 hr tablet Take 1 tablet (600 mg total) by mouth 2 (two) times daily as needed for cough or to loosen phlegm. Patient not taking: Reported on 07/04/2014  11/25/13   Street, [place], PA-C  hydrochlorothiazide (HYDRODIURIL) 12.5 MG tablet Take 1 tablet (12.5 mg total) by mouth daily. 07/15/14   Ambrose Finland, NP  ibuprofen (ADVIL,MOTRIN) 200 MG tablet Take 800 mg by mouth every 8 (eight) hours as needed for moderate pain.    [provider]  Ipratropium-Albuterol (COMBIVENT RESPIMAT) 20-100 MCG/ACT AERS respimat Inhale 1 puff into the lungs every 6 (six) hours as needed for wheezing. Patient not taking: Reported on 07/15/2014 11/26/13   Elwin Mocha, MD  Spacer/Aero-Holding Chambers (AEROCHAMBER PLUS WITH MASK) inhaler Use as instructed Patient not taking: Reported on 07/04/2014 11/25/13   Street, [place], PA-C     Family History Family History  Problem Relation Age of Onset  . Hypertension Father   . Thyroid disease Mother   . Diabetes Maternal Grandmother   . Thyroid disease Maternal Grandmother   . Diabetes Maternal Aunt   . Diabetes Maternal Aunt   . Kidney disease Maternal Aunt     Social History Social History  Substance Use Topics  . Smoking status: Current Every Day Smoker    Packs/day: 1.00    Years: 10.00    Types: Cigarettes  . Smokeless tobacco: Never Used  . Alcohol use 0.6 oz/week    1 Glasses of wine per week     Comment: occasion     Allergies   Food allergy formula   Review of Systems Review of Systems  Respiratory: Positive for shortness of breath.   Cardiovascular: Positive for chest pain, palpitations and leg swelling.  Endocrine: Positive for heat intolerance.  Neurological: Positive for tremors.  Psychiatric/Behavioral:       Anxiety    Ten systems reviewed and are negative for acute change, except as noted in the HPI.   Physical Exam Updated Vital Signs BP (!) 156/82 (BP Location: Right Arm)   Pulse (!) 119   Temp 98.1 F (36.7 C) (Oral)   Resp 18   LMP 12/06/2016   SpO2 100%   Physical Exam  Constitutional: She is oriented to person, place, and time. She appears well-developed and well-nourished. No distress.  HENT:  Head: Normocephalic and atraumatic.  Eyes: Conjunctivae are normal. No scleral icterus.  Neck: Normal range of motion. Thyromegaly present.  Palpable enlarged thyroid, worse on the right  Cardiovascular: Normal rate and regular rhythm.  Exam reveals no gallop and no friction rub.   Murmur heard. Tachycardia Loud, holosystolic flow murmur Bilateral ankle and foot swelling. 2-3+ pitting edema, worse on the right  Pulmonary/Chest: Effort normal and breath sounds normal. No respiratory distress. She has no rales.  Abdominal: Soft. Bowel sounds are normal. She exhibits no distension and no mass. There is no tenderness. There  is no guarding.  Neurological: She is alert and oriented to person, place, and time.  Skin: Skin is warm and dry. She is not diaphoretic.  Psychiatric: Her behavior is normal.  Nursing note and vitals reviewed.    ED Treatments / Results  Labs (all labs ordered are listed, but only abnormal results are displayed) Labs Reviewed  CBC WITH DIFFERENTIAL/PLATELET  COMPREHENSIVE METABOLIC PANEL  I-STAT TROPONIN, ED    EKG  EKG Interpretation None       Radiology No results found.  Procedures .Critical Care Performed by: Arthor Captain Authorized by: Arthor Captain   Critical care provider statement:    Critical care time (minutes):  65   Critical care was necessary to treat or prevent imminent or life-threatening deterioration of the following  conditions:  Metabolic crisis   Critical care was time spent personally by me on the following activities:  Re-evaluation of patient's condition, pulse oximetry, ordering and review of radiographic studies, ordering and review of laboratory studies, ordering and performing treatments and interventions, obtaining history from patient or surrogate, interpretation of cardiac output measurements, examination of patient, evaluation of patient's response to treatment, discussions with consultants and development of treatment plan with patient or surrogate    (including critical care time)  Medications Ordered in ED Medications - No data to display   Initial Impression / Assessment and Plan / ED Course  I have reviewed the triage vital signs and the nursing notes.  Pertinent labs & imaging results that were available during my care of the patient were reviewed by me and considered in my medical decision making (see chart for details).     Patient with murmur, CHF, agitation, tachychardia, pedal edema, sob.   Her Burch Wartofsky score is 45-50 - suggestive of thryoid storm. The patient is not delirious or hyperthermic. I discussed the  case with both internal medicine and pulmonary and critical care. Patient appears appropriate for stepdown at this time and will be admitted by the hospitalist. She is stable throughout her visit in the emergency department. Final Clinical Impressions(s) / ED Diagnoses   Final diagnoses:  Thyrotoxicosis with thyrotoxic crisis, unspecified thyrotoxicosis type    New Prescriptions New Prescriptions   No medications on file     Arthor Captain, PA-C 12/14/16 0155    Mancel Bale, MD 12/20/16 1443 Medical screening examination/treatment/procedure(s) were conducted as a shared visit with non-physician practitioner(s) and myself.  I personally evaluated the patient during the encounter.   EKG Interpretation  Date/Time:  Thursday December 13 2016 21:25:48 EDT Ventricular Rate:  117 PR Interval:    QRS Duration: 81 QT Interval:  321 QTC Calculation: 448 R Axis:   60 Text Interpretation:  Sinus tachycardia Aberrant complex Probable left atrial enlargement Probable left ventricular hypertrophy Baseline wander in lead(s) V1 since last tracing no significant change Confirmed by Mancel Bale [phone]) on 12/13/2016 9:44:10 PM Also confirmed by Mancel Bale [phone]), editor Elita Quick ([id number])  on 12/14/2016 7:35:44 AM        Mancel Bale, MD 12/21/16 1056

## 2016-12-13 NOTE — ED Triage Notes (Signed)
Pt reports chest pain,  SOB and bilat feet swelling sent to ED from [facility] for cardiac work up

## 2016-12-13 NOTE — ED Provider Notes (Signed)
  Face-to-face evaluation   History: She presents for evaluation of leg swelling, weight loss, and shortness of breath.  She has also noticed some swelling of the right neck.  Physical exam: Alert, cooperative.  Neck with bilateral thyromegaly, right greater than left.  No significant tenderness of the thyroid glands.  Heart with tachycardia.  Lungs clear anteriorly.  Legs with mild lower leg edema.  Nonspecific reddened areas, of the ankles and feet bilaterally.  Medical screening examination/treatment/procedure(s) were conducted as a shared visit with non-physician practitioner(s) and myself.  I personally evaluated the patient during the encounter    Mancel Bale, MD 12/21/16 1109

## 2016-12-14 ENCOUNTER — Inpatient Hospital Stay (HOSPITAL_COMMUNITY): Payer: BLUE CROSS/BLUE SHIELD

## 2016-12-14 DIAGNOSIS — R079 Chest pain, unspecified: Secondary | ICD-10-CM | POA: Diagnosis present

## 2016-12-14 DIAGNOSIS — R0600 Dyspnea, unspecified: Secondary | ICD-10-CM | POA: Diagnosis present

## 2016-12-14 DIAGNOSIS — E0591 Thyrotoxicosis, unspecified with thyrotoxic crisis or storm: Secondary | ICD-10-CM | POA: Diagnosis not present

## 2016-12-14 DIAGNOSIS — E010 Iodine-deficiency related diffuse (endemic) goiter: Secondary | ICD-10-CM | POA: Diagnosis not present

## 2016-12-14 DIAGNOSIS — E039 Hypothyroidism, unspecified: Secondary | ICD-10-CM | POA: Diagnosis not present

## 2016-12-14 DIAGNOSIS — D649 Anemia, unspecified: Secondary | ICD-10-CM | POA: Diagnosis not present

## 2016-12-14 DIAGNOSIS — E0590 Thyrotoxicosis, unspecified without thyrotoxic crisis or storm: Secondary | ICD-10-CM | POA: Diagnosis present

## 2016-12-14 DIAGNOSIS — I361 Nonrheumatic tricuspid (valve) insufficiency: Secondary | ICD-10-CM

## 2016-12-14 DIAGNOSIS — Z91013 Allergy to seafood: Secondary | ICD-10-CM | POA: Diagnosis not present

## 2016-12-14 DIAGNOSIS — R931 Abnormal findings on diagnostic imaging of heart and coronary circulation: Secondary | ICD-10-CM | POA: Diagnosis present

## 2016-12-14 DIAGNOSIS — J45909 Unspecified asthma, uncomplicated: Secondary | ICD-10-CM | POA: Diagnosis not present

## 2016-12-14 DIAGNOSIS — F17210 Nicotine dependence, cigarettes, uncomplicated: Secondary | ICD-10-CM | POA: Diagnosis present

## 2016-12-14 DIAGNOSIS — R030 Elevated blood-pressure reading, without diagnosis of hypertension: Secondary | ICD-10-CM | POA: Diagnosis present

## 2016-12-14 DIAGNOSIS — R0609 Other forms of dyspnea: Secondary | ICD-10-CM | POA: Diagnosis present

## 2016-12-14 DIAGNOSIS — R0602 Shortness of breath: Secondary | ICD-10-CM | POA: Diagnosis not present

## 2016-12-14 DIAGNOSIS — Z91018 Allergy to other foods: Secondary | ICD-10-CM | POA: Diagnosis not present

## 2016-12-14 LAB — COMPREHENSIVE METABOLIC PANEL
ALK PHOS: 44 U/L (ref 38–126)
ALT: 27 U/L (ref 14–54)
AST: 28 U/L (ref 15–41)
Albumin: 3.2 g/dL — ABNORMAL LOW (ref 3.5–5.0)
Anion gap: 5 (ref 5–15)
BILIRUBIN TOTAL: 0.6 mg/dL (ref 0.3–1.2)
BUN: 19 mg/dL (ref 6–20)
CALCIUM: 8.9 mg/dL (ref 8.9–10.3)
CO2: 22 mmol/L (ref 22–32)
CREATININE: 0.59 mg/dL (ref 0.44–1.00)
Chloride: 112 mmol/L — ABNORMAL HIGH (ref 101–111)
GFR calc Af Amer: >60 mL/min (ref >60)
GLUCOSE: 96 mg/dL (ref 65–99)
Potassium: 3.9 mmol/L (ref 3.5–5.1)
Sodium: 139 mmol/L (ref 135–145)
TOTAL PROTEIN: 6.1 g/dL — AB (ref 6.5–8.1)

## 2016-12-14 LAB — ECHOCARDIOGRAM COMPLETE
HEIGHTINCHES: 71 in
WEIGHTICAEL: 4095.26 [oz_av]

## 2016-12-14 LAB — URINALYSIS, ROUTINE W REFLEX MICROSCOPIC
BILIRUBIN URINE: NEGATIVE
GLUCOSE, UA: 50 mg/dL — AB
Hgb urine dipstick: NEGATIVE
KETONES UR: NEGATIVE mg/dL
LEUKOCYTES UA: NEGATIVE
Nitrite: NEGATIVE
PH: 5 (ref 5.0–8.0)
Protein, ur: NEGATIVE mg/dL
SPECIFIC GRAVITY, URINE: 1.035 — AB (ref 1.005–1.030)

## 2016-12-14 LAB — CBC
HCT: 27.7 % — ABNORMAL LOW (ref 36.0–46.0)
Hemoglobin: 9.5 g/dL — ABNORMAL LOW (ref 12.0–15.0)
MCH: 30.9 pg (ref 26.0–34.0)
MCHC: 34.3 g/dL (ref 30.0–36.0)
MCV: 90.2 fL (ref 78.0–100.0)
PLATELETS: 219 10*3/uL (ref 150–400)
RBC: 3.07 MIL/uL — ABNORMAL LOW (ref 3.87–5.11)
RDW: 12.2 % (ref 11.5–15.5)
WBC: 5.5 10*3/uL (ref 4.0–10.5)

## 2016-12-14 LAB — BRAIN NATRIURETIC PEPTIDE: B Natriuretic Peptide: 72.2 pg/mL (ref 0.0–100.0)

## 2016-12-14 LAB — GLUCOSE, CAPILLARY: GLUCOSE-CAPILLARY: 96 mg/dL (ref 65–99)

## 2016-12-14 LAB — LIPASE, BLOOD: Lipase: 34 U/L (ref 11–51)

## 2016-12-14 LAB — T4, FREE: FREE T4: 5.17 ng/dL — AB (ref 0.61–1.12)

## 2016-12-14 LAB — HIV ANTIBODY (ROUTINE TESTING W REFLEX): HIV SCREEN 4TH GENERATION: NONREACTIVE

## 2016-12-14 LAB — MRSA PCR SCREENING: MRSA by PCR: NEGATIVE

## 2016-12-14 MED ORDER — CHOLESTYRAMINE 4 G PO PACK
4.0000 g | PACK | ORAL | Status: DC
  Administered 2016-12-14 (×2): 4 g via ORAL
  Filled 2016-12-14 (×2): qty 1

## 2016-12-14 MED ORDER — POTASSIUM IODIDE (EXPECTORANT) 1 GM/ML PO SOLN
250.0000 mg | Freq: Four times a day (QID) | ORAL | Status: DC
  Administered 2016-12-14: 250 mg via ORAL
  Filled 2016-12-14: qty 30

## 2016-12-14 MED ORDER — PANTOPRAZOLE SODIUM 40 MG IV SOLR
40.0000 mg | INTRAVENOUS | Status: DC
  Administered 2016-12-14 – 2016-12-15 (×2): 40 mg via INTRAVENOUS
  Filled 2016-12-14 (×2): qty 40

## 2016-12-14 MED ORDER — PROPYLTHIOURACIL 50 MG PO TABS
250.0000 mg | ORAL_TABLET | ORAL | Status: DC
  Administered 2016-12-14 (×2): 250 mg via ORAL
  Filled 2016-12-14 (×3): qty 5

## 2016-12-14 MED ORDER — CHOLESTYRAMINE 4 G PO PACK
4.0000 g | PACK | Freq: Two times a day (BID) | ORAL | Status: DC
  Administered 2016-12-14 – 2016-12-16 (×5): 4 g via ORAL
  Filled 2016-12-14 (×9): qty 1

## 2016-12-14 MED ORDER — LORAZEPAM 2 MG/ML IJ SOLN
1.0000 mg | Freq: Once | INTRAMUSCULAR | Status: AC
  Administered 2016-12-14: 1 mg via INTRAVENOUS
  Filled 2016-12-14: qty 1

## 2016-12-14 MED ORDER — PROPRANOLOL HCL 20 MG PO TABS
20.0000 mg | ORAL_TABLET | Freq: Four times a day (QID) | ORAL | Status: DC
  Administered 2016-12-14 – 2016-12-16 (×7): 20 mg via ORAL
  Filled 2016-12-14 (×7): qty 1

## 2016-12-14 MED ORDER — PROPRANOLOL HCL 10 MG PO TABS
10.0000 mg | ORAL_TABLET | Freq: Four times a day (QID) | ORAL | Status: DC
  Administered 2016-12-14: 10 mg via ORAL
  Filled 2016-12-14: qty 1

## 2016-12-14 MED ORDER — POTASSIUM IODIDE (EXPECTORANT) 1 GM/ML PO SOLN: 250.0000 mg | ORAL | Status: DC

## 2016-12-14 MED ORDER — IPRATROPIUM-ALBUTEROL 0.5-2.5 (3) MG/3ML IN SOLN: 3.0000 mL | RESPIRATORY_TRACT | Status: DC | PRN

## 2016-12-14 MED ORDER — PROPRANOLOL HCL 1 MG/ML IV SOLN
1.0000 mg | INTRAVENOUS | Status: DC
  Administered 2016-12-14 (×3): 1 mg via INTRAVENOUS
  Filled 2016-12-14 (×4): qty 1

## 2016-12-14 MED ORDER — PROPYLTHIOURACIL 50 MG PO TABS
200.0000 mg | ORAL_TABLET | ORAL | Status: DC
  Administered 2016-12-14 – 2016-12-17 (×17): 200 mg via ORAL
  Filled 2016-12-14 (×21): qty 4

## 2016-12-14 MED ORDER — POTASSIUM IODIDE (EXPECTORANT) 1 GM/ML PO SOLN
250.0000 mg | ORAL | Status: DC
  Administered 2016-12-14 – 2016-12-16 (×9): 250 mg via ORAL
  Filled 2016-12-14 (×11): qty 30

## 2016-12-14 MED ORDER — PIPERACILLIN-TAZOBACTAM 3.375 G IVPB
3.3750 g | Freq: Three times a day (TID) | INTRAVENOUS | Status: DC
  Filled 2016-12-14: qty 50

## 2016-12-14 MED ORDER — ONDANSETRON HCL 4 MG/2ML IJ SOLN
4.0000 mg | Freq: Four times a day (QID) | INTRAMUSCULAR | Status: DC | PRN
  Administered 2016-12-16: 4 mg via INTRAVENOUS
  Filled 2016-12-14: qty 2

## 2016-12-14 MED ORDER — ACETAMINOPHEN 325 MG PO TABS
650.0000 mg | ORAL_TABLET | ORAL | Status: DC | PRN
  Administered 2016-12-16 – 2016-12-17 (×2): 650 mg via ORAL
  Filled 2016-12-14 (×2): qty 2

## 2016-12-14 MED ORDER — ENOXAPARIN SODIUM 40 MG/0.4ML ~~LOC~~ SOLN
40.0000 mg | SUBCUTANEOUS | Status: DC
  Administered 2016-12-14 – 2016-12-16 (×3): 40 mg via SUBCUTANEOUS
  Filled 2016-12-14 (×4): qty 0.4

## 2016-12-14 MED ORDER — HYDROCORTISONE NA SUCCINATE PF 100 MG IJ SOLR
100.0000 mg | Freq: Three times a day (TID) | INTRAMUSCULAR | Status: DC
  Administered 2016-12-14 (×2): 100 mg via INTRAVENOUS
  Filled 2016-12-14 (×2): qty 2

## 2016-12-14 MED ORDER — PIPERACILLIN-TAZOBACTAM 3.375 G IVPB 30 MIN
3.3750 g | INTRAVENOUS | Status: AC
  Administered 2016-12-14: 3.375 g via INTRAVENOUS
  Filled 2016-12-14: qty 50

## 2016-12-14 MED ORDER — PROPYLTHIOURACIL 50 MG PO TABS
500.0000 mg | ORAL_TABLET | Freq: Once | ORAL | Status: AC
  Administered 2016-12-14: 500 mg via ORAL
  Filled 2016-12-14: qty 10

## 2016-12-14 MED ORDER — HYDROCORTISONE NA SUCCINATE PF 100 MG IJ SOLR
50.0000 mg | Freq: Three times a day (TID) | INTRAMUSCULAR | Status: DC
  Administered 2016-12-14 – 2016-12-15 (×3): 50 mg via INTRAVENOUS
  Filled 2016-12-14 (×3): qty 2

## 2016-12-14 NOTE — ED Notes (Signed)
Attempted to call report, primary nurse is at lunch will call back afterwards.

## 2016-12-14 NOTE — Progress Notes (Signed)
Pharmacy Antibiotic Note  Anne [name] is a 36 y.o. female c/o CP, SOB and bilateral feet swelling admitted on 12/13/2016 with sepsis.  Pharmacy has been consulted for zosyn dosing.  Plan: Zosyn 3.375g IV q8h (4 hour infusion).     Temp (24hrs), Avg:98.1 F (36.7 C), Min:98.1 F (36.7 C), Max:98.1 F (36.7 C)   Recent Labs Lab 12/13/16 2124  WBC 5.8  CREATININE 0.59    CrCl cannot be calculated (Unknown ideal weight.).    Allergies  Allergen Reactions  . Food Allergy Formula Anaphylaxis and Swelling    coconut  . Shellfish Allergy Swelling    Antimicrobials this admission: 8/24 zosyn >>    >>   Dose adjustments this admission:   Microbiology results:  BCx:   UCx:    Sputum:    MRSA PCR:   Thank you for allowing pharmacy to be a part of this patient's care.  Lorenza Evangelist 12/14/2016 1:57 AM

## 2016-12-14 NOTE — H&P (Signed)
History and Physical  KRISSIA SCHREIER ZOX:[id number] DOB: 08-25-1980 DOA: 12/13/2016  PCP:  Patient, No Pcp Per   Chief Complaint:  Dyspnea   History of Present Illness:  Pt is a 36 yo female with no significant PMH who came with cc of leg swelling for 2 days, dyspnea for 2 weeks, chest pain for a few days, palpitations, dizziness, nausea, vomiting #3 for 2 days, diarrhea 3 BM/day for 3 days, throat swelling for a week, fatigue and generalized feeling of weakness. No dysuria. No cough. No chills/fever. No wounds. She had 3 teeth removed about a week ago and she took PCN for a week. She also feels agitated/anxious but no confusion. I asked pt be admitted to ICU. Intensivist advised stepdown bed for now.   Review of Systems:  CONSTITUTIONAL:     +night sweats.  +fatigue.  No fever. No chills. Eyes:                            No visual changes.  No eye pain.  No eye discharge.   ENT:                              No epistaxis.  No sinus pain.  No sore throat.   No congestion. RESPIRATORY:           No cough.  No wheeze.  No hemoptysis.  +dyspnea CARDIOVASCULAR   :  +chest pains.  +palpitations. GASTROINTESTINAL:  +abdominal pain.  +nausea. +vomiting.  +diarrhea. No      constipation.  No hematemesis.  No hematochezia.  No melena. GENITOURINARY:      No urgency.  No frequency.  No dysuria.  No hematuria.  No      obstructive symptoms.  No discharge.  No pain.   MUSCULOSKELETAL:  No musculoskeletal pain.  No joint swelling.  No arthritis. NEUROLOGICAL:        No confusion.  +weakness. No headache. No seizure. PSYCHIATRIC:             No depression. +anxiety. No suicidal ideation. SKIN:                             No rashes.  No lesions.  No wounds. ENDOCRINE:                No weight loss.  No polydipsia.  No polyuria.  No polyphagia. HEMATOLOGIC:           No purpura.  No petechiae.  No bleeding.  ALLERGIC                 : No pruritus.  No angioedema Other:  Past Medical and  Surgical History:   Past Medical History:  Diagnosis Date  . Asthma    Past Surgical History:  Procedure Laterality Date  . ECTOPIC PREGNANCY SURGERY    . MANDIBLE SURGERY    . TUBAL LIGATION      Social History:   reports that she has been smoking Cigarettes.  She has a 10.00 pack-year smoking history. She has never used smokeless tobacco. She reports that she drinks about 0.6 oz of alcohol per week . She reports that she does not use drugs.   Allergies  Allergen Reactions  . Food Allergy Formula Anaphylaxis and Swelling    coconut  . Shellfish Allergy Swelling  Family History  Problem Relation Age of Onset  . Hypertension Father   . Thyroid disease Mother   . Diabetes Maternal Grandmother   . Thyroid disease Maternal Grandmother   . Diabetes Maternal Aunt   . Diabetes Maternal Aunt   . Kidney disease Maternal Aunt       Prior to Admission medications   Medication Sig Start Date End Date Taking? Authorizing Provider  HYDROcodone-acetaminophen (NORCO) 7.5-325 MG tablet TAKE ONE TABLET BY MOUTH EVERY 6 HOURS FOR DENTAL PAIN 12/06/16  Yes [provider]  ibuprofen (ADVIL,MOTRIN) 800 MG tablet Take 800 mg by mouth every 6 (six) hours as needed. for pain 12/10/16  Yes [provider]  penicillin v potassium (VEETID) 500 MG tablet TAKE 2 TABLETS NOWM THEN 1 TABLET BY MOUTH 4 TIMES A DAY 12/05/16  Yes [provider]  albuterol-ipratropium (COMBIVENT) 18-103 MCG/ACT inhaler Inhale 2 puffs into the lungs every 4 (four) hours as needed for wheezing or shortness of breath. Patient not taking: Reported on 12/13/2016 11/25/13   Street, [place], PA-C  cephALEXin (KEFLEX) 500 MG capsule Take 1 capsule (500 mg total) by mouth 4 (four) times daily. Patient not taking: Reported on 07/15/2014 07/04/14   Piepenbrink, Victorino Dike, PA-C  cetirizine (ZYRTEC ALLERGY) 10 MG tablet Take 1 tablet (10 mg total) by mouth daily. Patient not taking: Reported on 07/15/2014 07/04/14    Piepenbrink, Victorino Dike, PA-C  clindamycin (CLEOCIN) 300 MG capsule Take 1 capsule (300 mg total) by mouth 3 (three) times daily. Patient not taking: Reported on 07/04/2014 06/22/14   Ozella Rocks, MD  diphenhydrAMINE (BENADRYL) 25 MG tablet Take 1 tablet (25 mg total) by mouth every 6 (six) hours as needed for itching (Rash). Patient not taking: Reported on 07/15/2014 07/04/14   Piepenbrink, Victorino Dike, PA-C  ergocalciferol (VITAMIN D2) 50000 UNITS capsule Take 1 capsule (50,000 Units total) by mouth once a week. Patient not taking: Reported on 12/13/2016 07/27/14   Ambrose Finland, NP  fluconazole (DIFLUCAN) 150 MG tablet Take 1 tablet (150 mg total) by mouth once. May repeat in one week Patient not taking: Reported on 12/13/2016 07/15/14   Ambrose Finland, NP  fluticasone [facility]) 50 MCG/ACT nasal spray Place 2 sprays into both nostrils daily. Patient not taking: Reported on 07/04/2014 11/25/13   Street, [place], PA-C  guaiFENesin (MUCINEX) 600 MG 12 hr tablet Take 1 tablet (600 mg total) by mouth 2 (two) times daily as needed for cough or to loosen phlegm. Patient not taking: Reported on 07/04/2014 11/25/13   Street, [place], PA-C  hydrochlorothiazide (HYDRODIURIL) 12.5 MG tablet Take 1 tablet (12.5 mg total) by mouth daily. Patient not taking: Reported on 12/13/2016 07/15/14   Ambrose Finland, NP  Ipratropium-Albuterol (COMBIVENT RESPIMAT) 20-100 MCG/ACT AERS respimat Inhale 1 puff into the lungs every 6 (six) hours as needed for wheezing. Patient not taking: Reported on 07/15/2014 11/26/13   Elwin Mocha, MD  Spacer/Aero-Holding Chambers (AEROCHAMBER PLUS WITH MASK) inhaler Use as instructed Patient not taking: Reported on 07/04/2014 11/25/13   Street, [place], [place]    Physical Exam: BP (!) 152/91   Pulse (!) 120   Temp 98.1 F (36.7 C) (Oral)   Resp 18   LMP 12/06/2016 (Approximate)   SpO2 100%   Breastfeeding? Unknown Comment: tubal ligation  GENERAL :   Alert and cooperative, and appears to be  in mild acute distress. HEAD:           normocephalic. EYES:  PERRL, EOMI.  vision is grossly intact. EARS:           hearing grossly intact. NOSE:           No nasal discharge. THROAT:     Oral cavity and pharynx normal.   NECK:          thyromegaly CARDIAC:    Normal S1 and S2. + gallop. Systolic murmur + tachycardic Vascular:     ++ peripheral edema. Extremities are warm and well perfused. No carotid bruits. LUNGS:       Clear to auscultation  ABDOMEN: Positive bowel sounds. Soft, nondistended, nontender. No guarding or rebound.      MSK:           No joint erythema or tenderness. Normal muscular development. EXT           : No significant deformity or joint abnormality. Neuro        : Alert, oriented to person, place, and time.                      CN II-XII intact.                       Strength and sensation symmetric and intact throughout.                SKIN:            No rash. No lesions. PSYCH:       No hallucination. Patient is not suicidal.          Labs on Admission:  Reviewed.   Radiological Exams on Admission: Dg Chest 2 View  Result Date: 12/13/2016 CLINICAL DATA:  Patient with mid chest pain for 1 week. Shortness of breath. EXAM: CHEST  2 VIEW COMPARISON:  Chest radiograph 11/25/2013 FINDINGS: Monitoring leads overlie the patient. Normal cardiac and mediastinal contours. No consolidative pulmonary opacities. No pleural effusion or pneumothorax. Regional skeleton is unremarkable. IMPRESSION: No acute cardiopulmonary process. Electronically Signed   By: Annia Belt M.D.   On: 12/13/2016 22:03   Ct Angio Chest Pe W And/or Wo Contrast  Result Date: 12/14/2016 CLINICAL DATA:  Chest pain, shortness of breath, and bilateral foot swelling. Positive D-dimer. EXAM: CT ANGIOGRAPHY CHEST WITH CONTRAST TECHNIQUE: Multidetector CT imaging of the chest was performed using the standard protocol during bolus administration of intravenous contrast. Multiplanar CT image  reconstructions and MIPs were obtained to evaluate the vascular anatomy. CONTRAST:  100 mL Isovue 370 COMPARISON:  Chest radiographs 12/13/2016 FINDINGS: Cardiovascular: Pulmonary arterial opacification is adequate to the segmental level without emboli identified. Motion artifact limits assessment of the lingular and, to a lesser extent, left lower lobe arteries. There is no evidence of thoracic aortic aneurysm or dissection. The heart is borderline enlarged. No pericardial effusion is present. Mediastinum/Nodes: Mild to moderate diffuse enlargement of the thyroid without dominant nodule apparent by CT. No enlarged axillary, mediastinal, or hilar lymph nodes. Grossly unremarkable esophagus. Lungs/Pleura: No pleural effusion or pneumothorax. Evaluation of the lung parenchyma is mildly limited by respiratory motion artifact. There is no evidence of pneumonia or edema. A 3 mm nodule is noted along the right minor fissure (series 7, image 44). Upper Abdomen: Unremarkable. Musculoskeletal: Mild thoracic spondylosis. No acute osseous abnormality or suspicious osseous lesion. Review of the MIP images confirms the above findings. IMPRESSION: 1. No pulmonary emboli or other acute abnormality identified in the chest. Mild motion artifact as above. 2. **An  incidental finding of potential clinical significance has been found. 3 mm nodule along the right minor fissure. No follow-up needed if patient is low-risk. Non-contrast chest CT can be considered in 12 months if patient is high-risk. This recommendation follows the consensus statement: Guidelines for Management of Incidental Pulmonary Nodules Detected on CT Images: From the Fleischner Society 2017; Radiology 2017; 284:228-243.** 3. Diffuse thyroid enlargement. Electronically Signed   By: Sebastian Ache M.D.   On: 12/14/2016 00:05    EKG:  Independently reviewed. Sinus tachy  Assessment/Plan  Thyroid storm: ( impending)  Unclear if there's a triggering event, possible  UTI vs tooth abscess/infection. Started on zosyn , sent for UA/Ucx, oral exam is unremarkable for abscess. Reassess once UA is back/ in am.  Will give propranolol 1 mg every 4 hours to achieve HR goal of 60-80 with maintaining SBP> 100 in setting of possible acute HF. May increase dose later if HR is not at goal.  Started PTU 500 mg now then 250 mg every 4 hours. One hour after that, ordered potassium iodine to be give 250 mg every 6 hours. Started hydrocortisone 100 mg every 8 hours Cholestyramine 4 gm every 4 hours Acetaminophen 650 mg every 4 hours prn fever Keep on Tele, monitor I/O, check CXR 1 view as she had IVF in ED and may need diuretics but currently sat is > 95% , will check Echo. ProBNP pending Consult to endocrinology   zofran prn N/V, PPI IV daily : liver enzymes WNL, added lipase, keep NPO for now. No evidence /indication of intra abdominal infection. Will check [place] of gallbladder. CT chest with unremarkable upper abdomen  I explained to patient that she needs close monitoring due to higher mortality rate in thyroid storm patients.   Input & Output: ordered  Lines & Tubes: PIV DVT prophylaxis:  Burt enoxaparin  GI prophylaxis: PPI Consultants: Endocrinology Code Status: Full Family Communication: none at bedside Disposition Plan: TBD    Eston Esters M.D [facility]

## 2016-12-14 NOTE — Progress Notes (Signed)
PROGRESS NOTE    Anne [name]  FYT:[id number] DOB: 1980-04-29 DOA: 12/13/2016 PCP: Patient, No Pcp Per  Brief Narrative:Pt is a 36 yo female with no significant PMH who came with cc of leg swelling for 2 days, dyspnea for 2 weeks, chest pain for a few days, palpitations, dizziness, nausea, vomiting #3 for 2 days, diarrhea 3 BM/day for 3 days, throat swelling for a week, fatigue and generalized feeling of weakness. No dysuria. No cough. No chills/fever. No wounds. She had 3 teeth removed about a week ago and she took PCN for a week. She also feels anxious but no confusion.  Assessment & Plan:   Severe Thyrotoxicosis  -Less likely to be thyroid storm given lack of hyperpyrexia or mental status changes  -I called and discussed case with Dr. Talmage Coin of endocrinology -She has been started on a propylthiouracil will change dose to 200 mg every 4 hours, Propranolol, iodine, hydrocortisone and Questran,  -We'll cut down hydrocortisone dose  -Change propranolol from IV to by mouth and increase dose -Monitor in ICU today will check daily free T4 levels  -I greatly appreciate Dr.Kerr's assistance today when stable she will be transitioned from PTU to Methimazole at 60 mg daily and hydrocortisone 10 and 5 mg daily, Dr.Kerr's office will see her next week for FU -also check 2D ECHO, r/o tachycardia mediated cardiomyopathy  -monitor on tele  Anemia -normocytic, could be mediated by severe hyperthyroidism  DVT prophylaxis: lovenox Code Status: Full Code Family Communication: None at bedside Disposition Plan: Keep in ICU  Consultants:   D/w Domingo Pulse -Endocrine   Subjective: -still weak, some dyspnea, no chest pain  Objective: Vitals:   12/14/16 0600 12/14/16 0800 12/14/16 1003 12/14/16 1200  BP: (!) 151/78  (!) 151/68   Pulse: (!) 110  (!) 115   Resp: (!) 24  (!) 21   Temp:  97.8 F (36.6 C)  98 F (36.7 C)  TempSrc:  Oral  Oral  SpO2: 100%  100%   Weight:        Height:        Intake/Output Summary (Last 24 hours) at 12/14/16 1226 Last data filed at 12/14/16 1119  Gross per 24 hour  Intake              240 ml  Output             1000 ml  Net             -760 ml   Filed Weights   12/14/16 0315  Weight: 116.1 kg (255 lb 15.3 oz)    Examination:  General exam: Appears calm and comfortable, slightly anxious Respiratory system: Clear to auscultation.  Cardiovascular system: S1 & S2 heard, tachycardic Gastrointestinal system: Abdomen is nondistended, soft and nontender. Normal bowel sounds heard. Central nervous system: Alert and oriented. No focal neurological deficits. Extremities: trace non pitting edema Skin: No rashes, lesions or ulcers Psychiatry: anxious    Data Reviewed:   CBC:  Recent Labs Lab 12/13/16 2124 12/14/16 0431  WBC 5.8 5.5  NEUTROABS 2.7  --   HGB 10.2* 9.5*  HCT 30.6* 27.7*  MCV 90.5 90.2  PLT 245 219   Basic Metabolic Panel:  Recent Labs Lab 12/13/16 2124 12/14/16 0431  NA 138 139  K 3.8 3.9  CL 109 112*  CO2 24 22  GLUCOSE 121* 96  BUN 21* 19  CREATININE 0.59 0.59  CALCIUM 9.0 8.9   GFR: Estimated Creatinine Clearance:  135.1 mL/min (by C-G formula based on SCr of 0.59 mg/dL). Liver Function Tests:  Recent Labs Lab 12/13/16 2124 12/14/16 0431  AST 32 28  ALT 29 27  ALKPHOS 54 44  BILITOT 0.4 0.6  PROT 6.2* 6.1*  ALBUMIN 3.2* 3.2*    Recent Labs Lab 12/13/16 2130  LIPASE 34   No results for input(s): AMMONIA in the last 168 hours. Coagulation Profile: No results for input(s): INR, PROTIME in the last 168 hours. Cardiac Enzymes: No results for input(s): CKTOTAL, CKMB, CKMBINDEX, TROPONINI in the last 168 hours. BNP (last 3 results) No results for input(s): PROBNP in the last 8760 hours. HbA1C: No results for input(s): HGBA1C in the last 72 hours. CBG:  Recent Labs Lab 12/14/16 0743  GLUCAP 96   Lipid Profile: No results for input(s): CHOL, HDL, LDLCALC, TRIG,  CHOLHDL, LDLDIRECT in the last 72 hours. Thyroid Function Tests:  Recent Labs  12/13/16 2242  TSH <0.010*  FREET4 5.17*   Anemia Panel: No results for input(s): VITAMINB12, FOLATE, FERRITIN, TIBC, IRON, RETICCTPCT in the last 72 hours. Urine analysis:    Component Value Date/Time   COLORURINE YELLOW 12/14/2016 0607   APPEARANCEUR CLEAR 12/14/2016 0607   LABSPEC 1.035 (H) 12/14/2016 0607   PHURINE 5.0 12/14/2016 0607   GLUCOSEU 50 (A) 12/14/2016 0607   HGBUR NEGATIVE 12/14/2016 0607   BILIRUBINUR NEGATIVE 12/14/2016 0607   KETONESUR NEGATIVE 12/14/2016 0607   PROTEINUR NEGATIVE 12/14/2016 0607   NITRITE NEGATIVE 12/14/2016 0607   LEUKOCYTESUR NEGATIVE 12/14/2016 5409   Sepsis Labs: @LABRCNTIP (procalcitonin:4,lacticidven:4)  ) Recent Results (from the past 240 hour(s))  MRSA PCR Screening     Status: None   Collection Time: 12/14/16  2:52 AM  Result Value Ref Range Status   MRSA by PCR NEGATIVE NEGATIVE Final    Comment:        The GeneXpert MRSA Assay (FDA approved for NASAL specimens only), is one component of a comprehensive MRSA colonization surveillance program. It is not intended to diagnose MRSA infection nor to guide or monitor treatment for MRSA infections.          Radiology Studies: Dg Chest 2 View  Result Date: 12/13/2016 CLINICAL DATA:  Patient with mid chest pain for 1 week. Shortness of breath. EXAM: CHEST  2 VIEW COMPARISON:  Chest radiograph 11/25/2013 FINDINGS: Monitoring leads overlie the patient. Normal cardiac and mediastinal contours. No consolidative pulmonary opacities. No pleural effusion or pneumothorax. Regional skeleton is unremarkable. IMPRESSION: No acute cardiopulmonary process. Electronically Signed   By: Annia Belt M.D.   On: 12/13/2016 22:03   Ct Angio Chest Pe W And/or Wo Contrast  Result Date: 12/14/2016 CLINICAL DATA:  Chest pain, shortness of breath, and bilateral foot swelling. Positive D-dimer. EXAM: CT ANGIOGRAPHY  CHEST WITH CONTRAST TECHNIQUE: Multidetector CT imaging of the chest was performed using the standard protocol during bolus administration of intravenous contrast. Multiplanar CT image reconstructions and MIPs were obtained to evaluate the vascular anatomy. CONTRAST:  100 mL Isovue 370 COMPARISON:  Chest radiographs 12/13/2016 FINDINGS: Cardiovascular: Pulmonary arterial opacification is adequate to the segmental level without emboli identified. Motion artifact limits assessment of the lingular and, to a lesser extent, left lower lobe arteries. There is no evidence of thoracic aortic aneurysm or dissection. The heart is borderline enlarged. No pericardial effusion is present. Mediastinum/Nodes: Mild to moderate diffuse enlargement of the thyroid without dominant nodule apparent by CT. No enlarged axillary, mediastinal, or hilar lymph nodes. Grossly unremarkable esophagus. Lungs/Pleura: No pleural effusion  or pneumothorax. Evaluation of the lung parenchyma is mildly limited by respiratory motion artifact. There is no evidence of pneumonia or edema. A 3 mm nodule is noted along the right minor fissure (series 7, image 44). Upper Abdomen: Unremarkable. Musculoskeletal: Mild thoracic spondylosis. No acute osseous abnormality or suspicious osseous lesion. Review of the MIP images confirms the above findings. IMPRESSION: 1. No pulmonary emboli or other acute abnormality identified in the chest. Mild motion artifact as above. 2. **An incidental finding of potential clinical significance has been found. 3 mm nodule along the right minor fissure. No follow-up needed if patient is low-risk. Non-contrast chest CT can be considered in 12 months if patient is high-risk. This recommendation follows the consensus statement: Guidelines for Management of Incidental Pulmonary Nodules Detected on CT Images: From the Fleischner Society 2017; Radiology 2017; 284:228-243.** 3. Diffuse thyroid enlargement. Electronically Signed   By:  Sebastian Ache M.D.   On: 12/14/2016 00:05        Scheduled Meds: . cholestyramine  4 g Oral BID  . enoxaparin (LOVENOX) injection  40 mg Subcutaneous Q24H  . hydrocortisone sod succinate (SOLU-CORTEF) inj  50 mg Intravenous Q8H  . pantoprazole (PROTONIX) IV  40 mg Intravenous Q24H  . potassium iodide  250 mg Oral 4 times per day  . propranolol  20 mg Oral QID  . propylthiouracil  200 mg Oral Q4H   Continuous Infusions:   LOS: 0 days    Time spent:    Zannie Cove, MD [facility] Pager [phone]  If 7PM-7AM, please contact night-coverage [email or web address] Password [facility] 12/14/2016, 12:26 PM

## 2016-12-14 NOTE — ED Notes (Signed)
Gave report to [facility]

## 2016-12-14 NOTE — Progress Notes (Signed)
  Echocardiogram 2D Echocardiogram has been performed.  Drystan Reader T Vito Beg 12/14/2016, 4:39 PM

## 2016-12-14 NOTE — Care Management Note (Signed)
Case Management Note  Patient Details  Name: Anne [name] MRN: [id number] Date of Birth: Sep 18, 1980  Subjective/Objective:                   36 yo female with no significant PMH who came with cc of leg swelling for 2 days, dyspnea for 2 weeks, chest pain for a few days, palpitations, dizziness, nausea, vomiting #3 for 2 days, diarrhea 3 BM/day for 3 days, throat swelling for a week, fatigue and generalized feeling of weakness. No dysuria. No cough. No chills/fever. No wounds. She had 3 teeth removed about a week ago and she took PCN for a week. She also feels agitated/anxious but no confusion. I asked pt be admitted to ICU. Intensivist advised stepdown bed for now.    Action/Plan: Date:  December 14, 2016 Chart reviewed for concurrent status and case management needs. Will continue to follow patient progress. Discharge Planning: following for needs Expected discharge date: [id number] Marcelle Smiling, BSN, [place], [place]   [phone]  Expected Discharge Date:                  Expected Discharge Plan:  Home/Self Care  In-House Referral:     Discharge planning Services  CM Consult  Post Acute Care Choice:    Choice offered to:     DME Arranged:    DME Agency:     HH Arranged:    HH Agency:     Status of Service:  In process, will continue to follow  If discussed at Long Length of Stay Meetings, dates discussed:    Additional Comments:  Golda Acre, RN 12/14/2016, 8:57 AM

## 2016-12-15 ENCOUNTER — Inpatient Hospital Stay (HOSPITAL_COMMUNITY): Payer: BLUE CROSS/BLUE SHIELD

## 2016-12-15 LAB — GLUCOSE, CAPILLARY: Glucose-Capillary: 93 mg/dL (ref 65–99)

## 2016-12-15 LAB — COMPREHENSIVE METABOLIC PANEL
ALK PHOS: 44 U/L (ref 38–126)
ALT: 25 U/L (ref 14–54)
ANION GAP: 6 (ref 5–15)
AST: 26 U/L (ref 15–41)
Albumin: 3.1 g/dL — ABNORMAL LOW (ref 3.5–5.0)
BILIRUBIN TOTAL: 0.5 mg/dL (ref 0.3–1.2)
BUN: 19 mg/dL (ref 6–20)
CALCIUM: 8.6 mg/dL — AB (ref 8.9–10.3)
CO2: 24 mmol/L (ref 22–32)
CREATININE: 0.71 mg/dL (ref 0.44–1.00)
Chloride: 109 mmol/L (ref 101–111)
Glucose, Bld: 105 mg/dL — ABNORMAL HIGH (ref 65–99)
Potassium: 3.7 mmol/L (ref 3.5–5.1)
SODIUM: 139 mmol/L (ref 135–145)
TOTAL PROTEIN: 6.1 g/dL — AB (ref 6.5–8.1)

## 2016-12-15 LAB — URINE CULTURE

## 2016-12-15 LAB — CBC
HCT: 28.9 % — ABNORMAL LOW (ref 36.0–46.0)
Hemoglobin: 9.7 g/dL — ABNORMAL LOW (ref 12.0–15.0)
MCH: 30.4 pg (ref 26.0–34.0)
MCHC: 33.6 g/dL (ref 30.0–36.0)
MCV: 90.6 fL (ref 78.0–100.0)
PLATELETS: 231 10*3/uL (ref 150–400)
RBC: 3.19 MIL/uL — AB (ref 3.87–5.11)
RDW: 12.1 % (ref 11.5–15.5)
WBC: 6.4 10*3/uL (ref 4.0–10.5)

## 2016-12-15 LAB — T4, FREE: Free T4: 4.79 ng/dL — ABNORMAL HIGH (ref 0.61–1.12)

## 2016-12-15 LAB — T3, FREE: T3, Free: 19.9 pg/mL — ABNORMAL HIGH (ref 2.0–4.4)

## 2016-12-15 LAB — T3: T3, Total: 596 ng/dL — ABNORMAL HIGH (ref 71–180)

## 2016-12-15 MED ORDER — FUROSEMIDE 10 MG/ML IJ SOLN
20.0000 mg | Freq: Once | INTRAMUSCULAR | Status: AC
  Administered 2016-12-15: 20 mg via INTRAVENOUS
  Filled 2016-12-15: qty 2

## 2016-12-15 MED ORDER — HYDROCORTISONE NA SUCCINATE PF 100 MG IJ SOLR
25.0000 mg | Freq: Two times a day (BID) | INTRAMUSCULAR | Status: DC
  Administered 2016-12-15 – 2016-12-16 (×2): 25 mg via INTRAVENOUS
  Filled 2016-12-15 (×2): qty 0.5

## 2016-12-15 MED ORDER — POTASSIUM CHLORIDE CRYS ER 20 MEQ PO TBCR
40.0000 meq | EXTENDED_RELEASE_TABLET | Freq: Once | ORAL | Status: AC
  Administered 2016-12-15: 40 meq via ORAL
  Filled 2016-12-15: qty 2

## 2016-12-15 MED ORDER — PANTOPRAZOLE SODIUM 40 MG PO TBEC
40.0000 mg | DELAYED_RELEASE_TABLET | Freq: Every day | ORAL | Status: DC
  Administered 2016-12-16 – 2016-12-17 (×2): 40 mg via ORAL
  Filled 2016-12-15 (×2): qty 1

## 2016-12-15 MED ORDER — MORPHINE SULFATE (PF) 2 MG/ML IV SOLN
2.0000 mg | Freq: Once | INTRAVENOUS | Status: AC
  Administered 2016-12-15: 2 mg via INTRAVENOUS
  Filled 2016-12-15: qty 1

## 2016-12-15 NOTE — Progress Notes (Signed)
PROGRESS NOTE    Anne [name]  WVP:[id number] DOB: [birth year] DOA: 12/13/2016 PCP: Patient, No Pcp Per  Brief Narrative:Pt is a 36 yo female with no significant PMH who came with cc of leg swelling for 2 days, dyspnea for 2 weeks, chest pain for a few days, palpitations, dizziness, nausea, vomiting #3 for 2 days, diarrhea 3 BM/day for 3 days, throat swelling for a week, fatigue and generalized feeling of weakness. No dysuria. No cough. No chills/fever. No wounds. She had 3 teeth removed about a week ago and she took PCN for a week. She also feels anxious but no confusion.  Assessment & Plan:   Severe Thyrotoxicosis  -Less likely to be thyroid storm given lack of hyperpyrexia or mental status changes  -I called and discussed case with Dr. Talmage Coin of endocrinology 8/24 -Continue propylthiouracil-200 mg every 4 hours, Propranolol, iodine, hydrocortisone and Questran -improving, will cut down hydrocortisone dose  -continue propranolol -Monitor daily free T4 levels -improving -I greatly appreciate Dr.Kerr's assistance today when stable she will be transitioned from PTU to Methimazole at 60 mg daily and hydrocortisone 10 and 5 mg daily, Dr.Kerr's office will see her next week for FU -TX out of ICU to tele today  Abnormal ECHO/ increased gradient across LVOT, -according to ECHO, aortic valve opens well, there is a possible subvalvular membrane. -I reviewed this with Cards Dr.Hochrein today, recommended Outpatient cardiology Follow up  Anemia -normocytic, could be mediated by severe hyperthyroidism  DVT prophylaxis: lovenox Code Status: Full Code Family Communication: None at bedside Disposition Plan: Keep in ICU  Consultants:   D/w Dr.Jeffrey Sharl Ma -Endocrine   Subjective: -feels better, still quite symptomatic on getting out of bed  Objective: Vitals:   12/15/16 0500 12/15/16 0517 12/15/16 0800 12/15/16 1200  BP: (!) 149/88     Pulse: (!) 102     Resp: (!) 26     Temp:    97.7 F (36.5 C) 98.3 F (36.8 C)  TempSrc:   Oral Oral  SpO2: 98%     Weight:  117 kg (257 lb 15 oz)    Height:        Intake/Output Summary (Last 24 hours) at 12/15/16 1206 Last data filed at 12/15/16 1147  Gross per 24 hour  Intake              600 ml  Output              900 ml  Net             -300 ml   Filed Weights   12/14/16 0315 12/15/16 0517  Weight: 116.1 kg (255 lb 15.3 oz) 117 kg (257 lb 15 oz)    Examination:  Gen: Awake, Alert, Oriented X 3, less anxious today HEENT: PERRLA, Neck supple, no JVD Lungs: Good air movement bilaterally, CTAB CVS: S1-S2 regular rate rhythm, tachycardic, faint systolic murmur appreciated Abd: soft, Non tender, non distended, BS present Extremities: Trace nonpitting edema Skin: no new rashes Psychiatric: Anxious    Data Reviewed:   CBC:  Recent Labs Lab 12/13/16 2124 12/14/16 0431 12/15/16 0506  WBC 5.8 5.5 6.4  NEUTROABS 2.7  --   --   HGB 10.2* 9.5* 9.7*  HCT 30.6* 27.7* 28.9*  MCV 90.5 90.2 90.6  PLT 245 219 231   Basic Metabolic Panel:  Recent Labs Lab 12/13/16 2124 12/14/16 0431 12/15/16 0312  NA 138 139 139  K 3.8 3.9 3.7  CL 109 112* 109  CO2 24 22 24   GLUCOSE 121* 96 105*  BUN 21* 19 19  CREATININE 0.59 0.59 0.71  CALCIUM 9.0 8.9 8.6*   GFR: Estimated Creatinine Clearance: 135.7 mL/min (by C-G formula based on SCr of 0.71 mg/dL). Liver Function Tests:  Recent Labs Lab 12/13/16 2124 12/14/16 0431 12/15/16 0312  AST 32 28 26  ALT 29 27 25   ALKPHOS 54 44 44  BILITOT 0.4 0.6 0.5  PROT 6.2* 6.1* 6.1*  ALBUMIN 3.2* 3.2* 3.1*    Recent Labs Lab 12/13/16 2130  LIPASE 34   No results for input(s): AMMONIA in the last 168 hours. Coagulation Profile: No results for input(s): INR, PROTIME in the last 168 hours. Cardiac Enzymes: No results for input(s): CKTOTAL, CKMB, CKMBINDEX, TROPONINI in the last 168 hours. BNP (last 3 results) No results for input(s): PROBNP in the last 8760  hours. HbA1C: No results for input(s): HGBA1C in the last 72 hours. CBG:  Recent Labs Lab 12/14/16 0743 12/15/16 0806  GLUCAP 96 93   Lipid Profile: No results for input(s): CHOL, HDL, LDLCALC, TRIG, CHOLHDL, LDLDIRECT in the last 72 hours. Thyroid Function Tests:  Recent Labs  12/13/16 2242 12/14/16 0431 12/15/16 0312  TSH <0.010*  --   --   FREET4 5.17*  --  4.79*  T3FREE  --  19.9*  --    Anemia Panel: No results for input(s): VITAMINB12, FOLATE, FERRITIN, TIBC, IRON, RETICCTPCT in the last 72 hours. Urine analysis:    Component Value Date/Time   COLORURINE YELLOW 12/14/2016 0607   APPEARANCEUR CLEAR 12/14/2016 0607   LABSPEC 1.035 (H) 12/14/2016 0607   PHURINE 5.0 12/14/2016 0607   GLUCOSEU 50 (A) 12/14/2016 0607   HGBUR NEGATIVE 12/14/2016 0607   BILIRUBINUR NEGATIVE 12/14/2016 0607   KETONESUR NEGATIVE 12/14/2016 0607   PROTEINUR NEGATIVE 12/14/2016 0607   NITRITE NEGATIVE 12/14/2016 0607   LEUKOCYTESUR NEGATIVE 12/14/2016 1610   Sepsis Labs: @LABRCNTIP (procalcitonin:4,lacticidven:4)  ) Recent Results (from the past 240 hour(s))  MRSA PCR Screening     Status: None   Collection Time: 12/14/16  2:52 AM  Result Value Ref Range Status   MRSA by PCR NEGATIVE NEGATIVE Final    Comment:        The GeneXpert MRSA Assay (FDA approved for NASAL specimens only), is one component of a comprehensive MRSA colonization surveillance program. It is not intended to diagnose MRSA infection nor to guide or monitor treatment for MRSA infections.   Culture, Urine     Status: Abnormal   Collection Time: 12/14/16  6:07 AM  Result Value Ref Range Status   Specimen Description URINE, CLEAN CATCH  Final   Special Requests NONE  Final   Culture MULTIPLE SPECIES PRESENT, SUGGEST RECOLLECTION (A)  Final   Report Status 12/15/2016 FINAL  Final         Radiology Studies: Dg Chest 2 View  Result Date: 12/13/2016 CLINICAL DATA:  Patient with mid chest pain for 1  week. Shortness of breath. EXAM: CHEST  2 VIEW COMPARISON:  Chest radiograph 11/25/2013 FINDINGS: Monitoring leads overlie the patient. Normal cardiac and mediastinal contours. No consolidative pulmonary opacities. No pleural effusion or pneumothorax. Regional skeleton is unremarkable. IMPRESSION: No acute cardiopulmonary process. Electronically Signed   By: Annia Belt M.D.   On: 12/13/2016 22:03   Ct Angio Chest Pe W And/or Wo Contrast  Result Date: 12/14/2016 CLINICAL DATA:  Chest pain, shortness of breath, and bilateral foot swelling. Positive D-dimer. EXAM: CT ANGIOGRAPHY CHEST WITH CONTRAST TECHNIQUE:  Multidetector CT imaging of the chest was performed using the standard protocol during bolus administration of intravenous contrast. Multiplanar CT image reconstructions and MIPs were obtained to evaluate the vascular anatomy. CONTRAST:  100 mL Isovue 370 COMPARISON:  Chest radiographs 12/13/2016 FINDINGS: Cardiovascular: Pulmonary arterial opacification is adequate to the segmental level without emboli identified. Motion artifact limits assessment of the lingular and, to a lesser extent, left lower lobe arteries. There is no evidence of thoracic aortic aneurysm or dissection. The heart is borderline enlarged. No pericardial effusion is present. Mediastinum/Nodes: Mild to moderate diffuse enlargement of the thyroid without dominant nodule apparent by CT. No enlarged axillary, mediastinal, or hilar lymph nodes. Grossly unremarkable esophagus. Lungs/Pleura: No pleural effusion or pneumothorax. Evaluation of the lung parenchyma is mildly limited by respiratory motion artifact. There is no evidence of pneumonia or edema. A 3 mm nodule is noted along the right minor fissure (series 7, image 44). Upper Abdomen: Unremarkable. Musculoskeletal: Mild thoracic spondylosis. No acute osseous abnormality or suspicious osseous lesion. Review of the MIP images confirms the above findings. IMPRESSION: 1. No pulmonary emboli  or other acute abnormality identified in the chest. Mild motion artifact as above. 2. **An incidental finding of potential clinical significance has been found. 3 mm nodule along the right minor fissure. No follow-up needed if patient is low-risk. Non-contrast chest CT can be considered in 12 months if patient is high-risk. This recommendation follows the consensus statement: Guidelines for Management of Incidental Pulmonary Nodules Detected on CT Images: From the Fleischner Society 2017; Radiology 2017; 284:228-243.** 3. Diffuse thyroid enlargement. Electronically Signed   By: Sebastian Ache M.D.   On: 12/14/2016 00:05   US Thyroid  Result Date: 12/15/2016 CLINICAL DATA:  Thyroid stool warm a EXAM: THYROID ULTRASOUND TECHNIQUE: Ultrasound examination of the thyroid gland and adjacent soft tissues was performed. COMPARISON:  None. FINDINGS: Parenchymal Echotexture: Mildly heterogenous Isthmus: 0.7 cm thickness Right lobe: 6.6 x 3.4 x 3.1 cm Left lobe: 6.6 x 3.5 x 3 cm _________________________________________________________ Estimated total number of nodules >/= 1 cm: 0 Number of spongiform nodules >/=  2 cm not described below (TR1): 0 Number of mixed cystic and solid nodules >/= 1.5 cm not described below (TR2): 0 _________________________________________________________ No discrete nodules identified. IMPRESSION: Thyromegaly with mild heterogeneity, no nodule. The above is in keeping with the ACR TI-RADS recommendations - J Am Coll Radiol 2017;14:587-595. Electronically Signed   By: Corlis Leak M.D.   On: 12/15/2016 09:04        Scheduled Meds: . cholestyramine  4 g Oral BID  . enoxaparin (LOVENOX) injection  40 mg Subcutaneous Q24H  . hydrocortisone sod succinate (SOLU-CORTEF) inj  50 mg Intravenous Q8H  . pantoprazole (PROTONIX) IV  40 mg Intravenous Q24H  . potassium chloride  40 mEq Oral Once  . potassium iodide  250 mg Oral 4 times per day  . propranolol  20 mg Oral QID  . propylthiouracil   200 mg Oral Q4H   Continuous Infusions:   LOS: 1 day    Time spent:    Zannie Cove, MD [facility] Pager [phone]  If 7PM-7AM, please contact night-coverage [email or web address] Password [facility] 12/15/2016, 12:06 PM

## 2016-12-15 NOTE — Progress Notes (Signed)
PHARMACIST - PHYSICIAN COMMUNICATION  DR:   Jomarie Longs  CONCERNING: IV to Oral Route Change Policy  RECOMMENDATION: This patient is receiving Protonix by the intravenous route.  Based on criteria approved by the Pharmacy and Therapeutics Committee, the intravenous medication(s) is/are being converted to the equivalent oral dose form(s).   DESCRIPTION: These criteria include:  The patient is eating (either orally or via tube) and/or has been taking other orally administered medications for a least 24 hours  The patient has no evidence of active gastrointestinal bleeding or impaired GI absorption (gastrectomy, short bowel, patient on TNA or NPO).  If you have questions about this conversion, please contact the Pharmacy Department   [phone] )  [facility]   Lynann Beaver [place], [facility] 12/15/2016 2:21 PM

## 2016-12-16 DIAGNOSIS — R0602 Shortness of breath: Secondary | ICD-10-CM

## 2016-12-16 LAB — CBC
HCT: 28.3 % — ABNORMAL LOW (ref 36.0–46.0)
Hemoglobin: 9.6 g/dL — ABNORMAL LOW (ref 12.0–15.0)
MCH: 30.5 pg (ref 26.0–34.0)
MCHC: 33.9 g/dL (ref 30.0–36.0)
MCV: 89.8 fL (ref 78.0–100.0)
PLATELETS: 242 10*3/uL (ref 150–400)
RBC: 3.15 MIL/uL — ABNORMAL LOW (ref 3.87–5.11)
RDW: 11.8 % (ref 11.5–15.5)
WBC: 8.7 10*3/uL (ref 4.0–10.5)

## 2016-12-16 LAB — COMPREHENSIVE METABOLIC PANEL
ALK PHOS: 46 U/L (ref 38–126)
ALT: 28 U/L (ref 14–54)
AST: 24 U/L (ref 15–41)
Albumin: 3.2 g/dL — ABNORMAL LOW (ref 3.5–5.0)
Anion gap: 8 (ref 5–15)
BUN: 19 mg/dL (ref 6–20)
CALCIUM: 8.7 mg/dL — AB (ref 8.9–10.3)
CHLORIDE: 108 mmol/L (ref 101–111)
CO2: 25 mmol/L (ref 22–32)
CREATININE: 0.68 mg/dL (ref 0.44–1.00)
GFR calc Af Amer: >60 mL/min (ref >60)
Glucose, Bld: 88 mg/dL (ref 65–99)
Potassium: 3.6 mmol/L (ref 3.5–5.1)
SODIUM: 141 mmol/L (ref 135–145)
Total Bilirubin: 0.4 mg/dL (ref 0.3–1.2)
Total Protein: 6.2 g/dL — ABNORMAL LOW (ref 6.5–8.1)

## 2016-12-16 LAB — GLUCOSE, CAPILLARY: Glucose-Capillary: 92 mg/dL (ref 65–99)

## 2016-12-16 LAB — T4, FREE: Free T4: 4.52 ng/dL — ABNORMAL HIGH (ref 0.61–1.12)

## 2016-12-16 MED ORDER — HYDROCORTISONE 10 MG PO TABS
10.0000 mg | ORAL_TABLET | Freq: Every day | ORAL | Status: DC
  Administered 2016-12-17: 10 mg via ORAL
  Filled 2016-12-16: qty 1

## 2016-12-16 MED ORDER — POTASSIUM CHLORIDE CRYS ER 20 MEQ PO TBCR
40.0000 meq | EXTENDED_RELEASE_TABLET | Freq: Once | ORAL | Status: AC
  Administered 2016-12-16: 40 meq via ORAL
  Filled 2016-12-16: qty 2

## 2016-12-16 MED ORDER — FUROSEMIDE 10 MG/ML IJ SOLN
20.0000 mg | Freq: Once | INTRAMUSCULAR | Status: AC
  Administered 2016-12-16: 20 mg via INTRAVENOUS
  Filled 2016-12-16: qty 2

## 2016-12-16 MED ORDER — HYDROCORTISONE 5 MG PO TABS
5.0000 mg | ORAL_TABLET | Freq: Every day | ORAL | Status: DC
  Administered 2016-12-16: 5 mg via ORAL
  Filled 2016-12-16 (×2): qty 1

## 2016-12-16 MED ORDER — HYDROCORTISONE 5 MG PO TABS: 5.0000 mg | ORAL_TABLET | Freq: Two times a day (BID) | ORAL | Status: DC

## 2016-12-16 MED ORDER — PROPRANOLOL HCL 20 MG PO TABS
60.0000 mg | ORAL_TABLET | Freq: Two times a day (BID) | ORAL | Status: DC
  Administered 2016-12-16 – 2016-12-17 (×2): 60 mg via ORAL
  Filled 2016-12-16 (×2): qty 3

## 2016-12-16 NOTE — Progress Notes (Signed)
PROGRESS NOTE    Anne [name]  ZOX:[id number] DOB: [birth year] DOA: 12/13/2016 PCP: Patient, No Pcp Per  Brief Narrative:Pt is a 36 yo female with no significant PMH who came with cc of leg swelling for 2 days, dyspnea for 2 weeks, chest pain for a few days, palpitations, dizziness, nausea, vomiting #3 for 2 days, diarrhea 3 BM/day for 3 days, throat swelling for a week, fatigue and generalized feeling of weakness. No dysuria. No cough. No chills/fever. No wounds. She had 3 teeth removed about a week ago and she took PCN for a week. She also feels anxious but no confusion.  Assessment & Plan:   Severe Thyrotoxicosis/Impending Thyroid storm -Less likely to be thyroid storm given lack of hyperpyrexia or mental status changes  -I called and discussed case with Dr. Talmage Coin of endocrinology 8/24 -Continue propylthiouracil-200 mg every 4 hours, increase Propranolol dos eto 60mg  BID -cut down hydrocortisone and Questran -clinically improving but tachycardic and symptomatic with minimal activity - free T4 levels improving -tomorrow will plan to transition from PTU to Methimazole at 60 mg daily and hydrocortisone 10 and 5 mg daily, Dr.Kerr's office will see her next week for FU -ambulate, OOB  Abnormal ECHO/ increased gradient across LVOT, -according to ECHO, aortic valve opens well, there is a possible subvalvular membrane. -I reviewed this with Cards Dr.Hochrein today, recommended Outpatient cardiology Follow up -will give single dose of lasix now due to iatrogenic fluid overload  Anemia -normocytic, could be mediated by severe hyperthyroidism  DVT prophylaxis: lovenox Code Status: Full Code Family Communication: None at bedside Disposition Plan:home tomorrow if stable  Consultants:   D/w Domingo Pulse -Endocrine   Subjective: -improving, still dyspneic with any activity  Objective: Vitals:   12/15/16 1700 12/15/16 1756 12/15/16 2016 12/16/16 0533  BP:  (!) 151/73 (!)  150/84 (!) 155/89  Pulse: (!) 103 (!) 102 99 94  Resp: (!) 28 (!) 28 20 19   Temp:  97.7 F (36.5 C) 97.7 F (36.5 C) 98.2 F (36.8 C)  TempSrc:  Oral Oral Oral  SpO2: 98% 100% 100% 100%  Weight:      Height:        Intake/Output Summary (Last 24 hours) at 12/16/16 1143 Last data filed at 12/15/16 1147  Gross per 24 hour  Intake                0 ml  Output              900 ml  Net             -900 ml   Filed Weights   12/14/16 0315 12/15/16 0517  Weight: 116.1 kg (255 lb 15.3 oz) 117 kg (257 lb 15 oz)    Examination:  Gen: Awake, Alert, Oriented X 3, more comfortable today HEENT: PERRLA, Neck supple, no JVD Lungs: rare basilar crackles CVS: RRR,No Gallops, tachycardic Abd: soft, Non tender, non distended, BS present Extremities: No Cyanosis, Clubbing or edema Skin: no new rashes    Data Reviewed:   CBC:  Recent Labs Lab 12/13/16 2124 12/14/16 0431 12/15/16 0506 12/16/16 0546  WBC 5.8 5.5 6.4 8.7  NEUTROABS 2.7  --   --   --   HGB 10.2* 9.5* 9.7* 9.6*  HCT 30.6* 27.7* 28.9* 28.3*  MCV 90.5 90.2 90.6 89.8  PLT 245 219 231 242   Basic Metabolic Panel:  Recent Labs Lab 12/13/16 2124 12/14/16 0431 12/15/16 0312 12/16/16 0546  NA 138 139 139  141  K 3.8 3.9 3.7 3.6  CL 109 112* 109 108  CO2 24 22 24 25   GLUCOSE 121* 96 105* 88  BUN 21* 19 19 19   CREATININE 0.59 0.59 0.71 0.68  CALCIUM 9.0 8.9 8.6* 8.7*   GFR: Estimated Creatinine Clearance: 135.7 mL/min (by C-G formula based on SCr of 0.68 mg/dL). Liver Function Tests:  Recent Labs Lab 12/13/16 2124 12/14/16 0431 12/15/16 0312 12/16/16 0546  AST 32 28 26 24   ALT 29 27 25 28   ALKPHOS 54 44 44 46  BILITOT 0.4 0.6 0.5 0.4  PROT 6.2* 6.1* 6.1* 6.2*  ALBUMIN 3.2* 3.2* 3.1* 3.2*    Recent Labs Lab 12/13/16 2130  LIPASE 34   No results for input(s): AMMONIA in the last 168 hours. Coagulation Profile: No results for input(s): INR, PROTIME in the last 168 hours. Cardiac Enzymes: No  results for input(s): CKTOTAL, CKMB, CKMBINDEX, TROPONINI in the last 168 hours. BNP (last 3 results) No results for input(s): PROBNP in the last 8760 hours. HbA1C: No results for input(s): HGBA1C in the last 72 hours. CBG:  Recent Labs Lab 12/14/16 0743 12/15/16 0806 12/16/16 0806  GLUCAP 96 93 92   Lipid Profile: No results for input(s): CHOL, HDL, LDLCALC, TRIG, CHOLHDL, LDLDIRECT in the last 72 hours. Thyroid Function Tests:  Recent Labs  12/13/16 2242 12/14/16 0431  12/16/16 0546  TSH <0.010*  --   --   --   FREET4 5.17*  --   < > 4.52*  T3FREE  --  19.9*  --   --   < > = values in this interval not displayed. Anemia Panel: No results for input(s): VITAMINB12, FOLATE, FERRITIN, TIBC, IRON, RETICCTPCT in the last 72 hours. Urine analysis:    Component Value Date/Time   COLORURINE YELLOW 12/14/2016 0607   APPEARANCEUR CLEAR 12/14/2016 0607   LABSPEC 1.035 (H) 12/14/2016 0607   PHURINE 5.0 12/14/2016 0607   GLUCOSEU 50 (A) 12/14/2016 0607   HGBUR NEGATIVE 12/14/2016 0607   BILIRUBINUR NEGATIVE 12/14/2016 0607   KETONESUR NEGATIVE 12/14/2016 0607   PROTEINUR NEGATIVE 12/14/2016 0607   NITRITE NEGATIVE 12/14/2016 0607   LEUKOCYTESUR NEGATIVE 12/14/2016 0277   Sepsis Labs: @LABRCNTIP (procalcitonin:4,lacticidven:4)  ) Recent Results (from the past 240 hour(s))  MRSA PCR Screening     Status: None   Collection Time: 12/14/16  2:52 AM  Result Value Ref Range Status   MRSA by PCR NEGATIVE NEGATIVE Final    Comment:        The GeneXpert MRSA Assay (FDA approved for NASAL specimens only), is one component of a comprehensive MRSA colonization surveillance program. It is not intended to diagnose MRSA infection nor to guide or monitor treatment for MRSA infections.   Culture, Urine     Status: Abnormal   Collection Time: 12/14/16  6:07 AM  Result Value Ref Range Status   Specimen Description URINE, CLEAN CATCH  Final   Special Requests NONE  Final   Culture  MULTIPLE SPECIES PRESENT, SUGGEST RECOLLECTION (A)  Final   Report Status 12/15/2016 FINAL  Final         Radiology Studies: US Thyroid  Result Date: 12/15/2016 CLINICAL DATA:  Thyroid stool warm a EXAM: THYROID ULTRASOUND TECHNIQUE: Ultrasound examination of the thyroid gland and adjacent soft tissues was performed. COMPARISON:  None. FINDINGS: Parenchymal Echotexture: Mildly heterogenous Isthmus: 0.7 cm thickness Right lobe: 6.6 x 3.4 x 3.1 cm Left lobe: 6.6 x 3.5 x 3 cm _________________________________________________________ Estimated total number of  nodules >/= 1 cm: 0 Number of spongiform nodules >/=  2 cm not described below (TR1): 0 Number of mixed cystic and solid nodules >/= 1.5 cm not described below (TR2): 0 _________________________________________________________ No discrete nodules identified. IMPRESSION: Thyromegaly with mild heterogeneity, no nodule. The above is in keeping with the ACR TI-RADS recommendations - J Am Coll Radiol 2017;14:587-595. Electronically Signed   By: Corlis Leak M.D.   On: 12/15/2016 09:04        Scheduled Meds: . cholestyramine  4 g Oral BID  . enoxaparin (LOVENOX) injection  40 mg Subcutaneous Q24H  . [START ON 12/17/2016] hydrocortisone  10 mg Oral Q breakfast   And  . hydrocortisone  5 mg Oral Q supper  . pantoprazole  40 mg Oral Daily  . propranolol  20 mg Oral QID  . propylthiouracil  200 mg Oral Q4H   Continuous Infusions:   LOS: 2 days    Time spent:    Zannie Cove, MD [facility] Pager [phone]  If 7PM-7AM, please contact night-coverage [email or web address] Password [facility] 12/16/2016, 11:43 AM

## 2016-12-17 LAB — BASIC METABOLIC PANEL
Anion gap: 6 (ref 5–15)
BUN: 16 mg/dL (ref 6–20)
CALCIUM: 8.7 mg/dL — AB (ref 8.9–10.3)
CHLORIDE: 106 mmol/L (ref 101–111)
CO2: 28 mmol/L (ref 22–32)
CREATININE: 0.59 mg/dL (ref 0.44–1.00)
GFR calc non Af Amer: >60 mL/min (ref >60)
Glucose, Bld: 103 mg/dL — ABNORMAL HIGH (ref 65–99)
Potassium: 3.9 mmol/L (ref 3.5–5.1)
SODIUM: 140 mmol/L (ref 135–145)

## 2016-12-17 LAB — T4, FREE: FREE T4: 4.17 ng/dL — AB (ref 0.61–1.12)

## 2016-12-17 LAB — GLUCOSE, CAPILLARY: GLUCOSE-CAPILLARY: 81 mg/dL (ref 65–99)

## 2016-12-17 MED ORDER — PROPRANOLOL HCL 60 MG PO TABS: 60.0000 mg | ORAL_TABLET | Freq: Two times a day (BID) | ORAL | 0 refills | Status: DC

## 2016-12-17 MED ORDER — METHIMAZOLE 10 MG PO TABS: 60.0000 mg | ORAL_TABLET | Freq: Every day | ORAL | 0 refills | Status: DC

## 2016-12-17 MED ORDER — IBUPROFEN 200 MG PO TABS: 200.0000 mg | ORAL_TABLET | Freq: Four times a day (QID) | ORAL | Status: DC | PRN

## 2016-12-17 MED ORDER — HYDROCORTISONE 5 MG PO TABS: 5.0000 mg | ORAL_TABLET | Freq: Two times a day (BID) | ORAL | 0 refills | Status: DC

## 2016-12-17 NOTE — Progress Notes (Signed)
Date: December 17, 2016 Chart reviewed for discharge orders: None found for case management. Shad Ledvina,BSN,RN3, CCM/[phone] 

## 2016-12-17 NOTE — Progress Notes (Signed)
Patient given discharge instructions, and verbalized an understanding of all discharge instructions.  Patient agrees with discharge plan, and is being discharged in stable medical condition.  

## 2016-12-17 NOTE — Discharge Summary (Signed)
Physician Discharge Summary  Anne [name] ZOX:[id number] DOB: 1980/05/16 DOA: 12/13/2016  PCP: Patient, No Pcp Per  Admit date: 12/13/2016 Discharge date: 12/17/2016  Time spent: 45 minutes  Recommendations for Outpatient Follow-up:  1. Dr.Jeffrey Sharl Ma in 1 week-office to call pt with FU appointment 2. PCP at [facility] health wellness clinic in 1 week-patient has an appointment, she needs referral to cardiology in one month   Discharge Diagnoses:  Principal Problem:   Severe Thyrotoxicosis/impending thyroid storm   DYspnea on exertion   Abnormal ECHO   Blood pressure elevated without history of HTN   Discharge Condition: Stable  Diet recommendation: Low sodium heart healthy  Filed Weights   12/14/16 0315 12/15/16 0517  Weight: 116.1 kg (255 lb 15.3 oz) 117 kg (257 lb 15 oz)    History of present illness:  :Pt is a 36 yo female with no significant PMH who came with cc of leg swelling for 2 days, dyspnea for 2 weeks, chest pain for a few days, palpitations, dizziness, nausea, vomiting #3 for 2 days, diarrhea 3 BM/day for 3 days, throat swelling for a week, fatigue and generalized feeling of weakness. No dysuria. No cough. No chills/fever. No wounds. She had 3 teeth removed about a week ago and she took PCN for a week  Hospital Course:  Severe Thyrotoxicosis/Impending Thyroid storm -She was admitted with increased palpitations dizziness, nausea, vomiting, diarrhea, chest pain, leg swelling for 2-3days, elevated blood pressure and severe tachycardia, on workup noted to have severe hypothyroidism with TSH of <0.01, elevated free T4 of 5.1 and T3 of 596 -Less likely to be thyroid storm given lack of hyperpyrexia or mental status changes  -I called and discussed case with Dr. Talmage Coin of endocrinology 8/24, who was of great assistance, she was treated with Propylthiouracil-200 mg every 4 hours, Propranolol, Hydrocortisone, Questran and Iodine .-She clinically improved significantly on  this regimen and also required couple of doses of diuretics - Free T4 levels are improving -At the time of Discharge transition to propranolol 60 mg twice a day, hydrocortisone 10 and 5 mg twice a day, Methimazole 60 mg daily  -Dr. Daune Perch office will see her within 1 week, patient has been advised multiple times of the importance of keeping this appointment  Abnormal ECHO/ increased gradient across LVOT, -according to ECHO, aortic valve opens well, but there is a possible subvalvular membrane. -I reviewed this with Cardiologist Dr.Hochrein, he recommended Outpatient cardiology Follow up -She was given a dose of Lasix yesterday due to fluid overload, appears euvolemic at this time  Anemia -normocytic, could be mediated by severe hyperthyroidism -Monitor for improvement with treatment of hyperthyroidism  Discharge Exam: Vitals:   12/16/16 2010 12/17/16 0507  BP: 135/72 136/78  Pulse: 93 89  Resp: 18 16  Temp: 98.1 F (36.7 C) 97.9 F (36.6 C)  SpO2: 100% 100%    General: Alert awake oriented 3 Cardiovascular: S1-S2 regular rate rhythm, faint systolic murmur Respiratory: Clear bilaterally  Discharge Instructions   Discharge Instructions    Diet - low sodium heart healthy    Complete by:  As directed    Increase activity slowly    Complete by:  As directed      Current Discharge Medication List    START taking these medications   Details  hydrocortisone (CORTEF) 5 MG tablet Take 1-2 tablets (5-10 mg total) by mouth 2 (two) times daily. Take 10mg  in the morning and 5mg  in the evening Qty: 60 tablet, Refills: 0  methimazole (TAPAZOLE) 10 MG tablet Take 6 tablets (60 mg total) by mouth daily. Qty: 120 tablet, Refills: 0    propranolol (INDERAL) 60 MG tablet Take 1 tablet (60 mg total) by mouth 2 (two) times daily with a meal. Qty: 60 tablet, Refills: 0      CONTINUE these medications which have CHANGED   Details  ibuprofen (ADVIL,MOTRIN) 200 MG tablet Take 1  tablet (200 mg total) by mouth every 6 (six) hours as needed for headache, mild pain or moderate pain. for pain      CONTINUE these medications which have NOT CHANGED   Details  ergocalciferol (VITAMIN D2) 50000 UNITS capsule Take 1 capsule (50,000 Units total) by mouth once a week. Qty: 12 capsule, Refills: 0      STOP taking these medications     HYDROcodone-acetaminophen (NORCO) 7.5-325 MG tablet      penicillin v potassium (VEETID) 500 MG tablet      albuterol-ipratropium (COMBIVENT) 18-103 MCG/ACT inhaler      cephALEXin (KEFLEX) 500 MG capsule      cetirizine (ZYRTEC ALLERGY) 10 MG tablet      clindamycin (CLEOCIN) 300 MG capsule      diphenhydrAMINE (BENADRYL) 25 MG tablet      fluconazole (DIFLUCAN) 150 MG tablet      fluticasone (FLONASE) 50 MCG/ACT nasal spray      guaiFENesin (MUCINEX) 600 MG 12 hr tablet      hydrochlorothiazide (HYDRODIURIL) 12.5 MG tablet      Ipratropium-Albuterol (COMBIVENT RESPIMAT) 20-100 MCG/ACT AERS respimat      Spacer/Aero-Holding Chambers (AEROCHAMBER PLUS WITH MASK) inhaler        Allergies  Allergen Reactions  . Food Allergy Formula Anaphylaxis and Swelling    coconut  . Shellfish Allergy Swelling   Follow-up Information    Talmage Coin, MD. Call in 1 week(s).   Specialty:  Endocrinology Why:  Office should call you, if you dont hear anything by tuesday, please call office for a follow up Contact information: [address] [place] [address] [phone]        PCP Follow up.   Why:  at [facility] Wellness clinic in 1 week       [facility]. Schedule an appointment as soon as possible for a visit in 1 month(s).   Specialty:  Cardiology Contact information: [address] [place] [address] [phone]           The results of significant diagnostics from this hospitalization (including imaging, microbiology, ancillary and  laboratory) are listed below for reference.    Significant Diagnostic Studies: Dg Chest 2 View  Result Date: 12/13/2016 CLINICAL DATA:  Patient with mid chest pain for 1 week. Shortness of breath. EXAM: CHEST  2 VIEW COMPARISON:  Chest radiograph 11/25/2013 FINDINGS: Monitoring leads overlie the patient. Normal cardiac and mediastinal contours. No consolidative pulmonary opacities. No pleural effusion or pneumothorax. Regional skeleton is unremarkable. IMPRESSION: No acute cardiopulmonary process. Electronically Signed   By: Annia Belt M.D.   On: 12/13/2016 22:03   Ct Angio Chest Pe W And/or Wo Contrast  Result Date: 12/14/2016 CLINICAL DATA:  Chest pain, shortness of breath, and bilateral foot swelling. Positive D-dimer. EXAM: CT ANGIOGRAPHY CHEST WITH CONTRAST TECHNIQUE: Multidetector CT imaging of the chest was performed using the standard protocol during bolus administration of intravenous contrast. Multiplanar CT image reconstructions and MIPs were obtained to evaluate the vascular anatomy. CONTRAST:  100  mL Isovue 370 COMPARISON:  Chest radiographs 12/13/2016 FINDINGS: Cardiovascular: Pulmonary arterial opacification is adequate to the segmental level without emboli identified. Motion artifact limits assessment of the lingular and, to a lesser extent, left lower lobe arteries. There is no evidence of thoracic aortic aneurysm or dissection. The heart is borderline enlarged. No pericardial effusion is present. Mediastinum/Nodes: Mild to moderate diffuse enlargement of the thyroid without dominant nodule apparent by CT. No enlarged axillary, mediastinal, or hilar lymph nodes. Grossly unremarkable esophagus. Lungs/Pleura: No pleural effusion or pneumothorax. Evaluation of the lung parenchyma is mildly limited by respiratory motion artifact. There is no evidence of pneumonia or edema. A 3 mm nodule is noted along the right minor fissure (series 7, image 44). Upper Abdomen: Unremarkable. Musculoskeletal:  Mild thoracic spondylosis. No acute osseous abnormality or suspicious osseous lesion. Review of the MIP images confirms the above findings. IMPRESSION: 1. No pulmonary emboli or other acute abnormality identified in the chest. Mild motion artifact as above. 2. **An incidental finding of potential clinical significance has been found. 3 mm nodule along the right minor fissure. No follow-up needed if patient is low-risk. Non-contrast chest CT can be considered in 12 months if patient is high-risk. This recommendation follows the consensus statement: Guidelines for Management of Incidental Pulmonary Nodules Detected on CT Images: From the Fleischner Society 2017; Radiology 2017; 284:228-243.** 3. Diffuse thyroid enlargement. Electronically Signed   By: Sebastian Ache M.D.   On: 12/14/2016 00:05   US Thyroid  Result Date: 12/15/2016 CLINICAL DATA:  Thyroid stool warm a EXAM: THYROID ULTRASOUND TECHNIQUE: Ultrasound examination of the thyroid gland and adjacent soft tissues was performed. COMPARISON:  None. FINDINGS: Parenchymal Echotexture: Mildly heterogenous Isthmus: 0.7 cm thickness Right lobe: 6.6 x 3.4 x 3.1 cm Left lobe: 6.6 x 3.5 x 3 cm _________________________________________________________ Estimated total number of nodules >/= 1 cm: 0 Number of spongiform nodules >/=  2 cm not described below (TR1): 0 Number of mixed cystic and solid nodules >/= 1.5 cm not described below (TR2): 0 _________________________________________________________ No discrete nodules identified. IMPRESSION: Thyromegaly with mild heterogeneity, no nodule. The above is in keeping with the ACR TI-RADS recommendations - J Am Coll Radiol 2017;14:587-595. Electronically Signed   By: Corlis Leak M.D.   On: 12/15/2016 09:04    Microbiology: Recent Results (from the past 240 hour(s))  MRSA PCR Screening     Status: None   Collection Time: 12/14/16  2:52 AM  Result Value Ref Range Status   MRSA by PCR NEGATIVE NEGATIVE Final     Comment:        The GeneXpert MRSA Assay (FDA approved for NASAL specimens only), is one component of a comprehensive MRSA colonization surveillance program. It is not intended to diagnose MRSA infection nor to guide or monitor treatment for MRSA infections.   Culture, Urine     Status: Abnormal   Collection Time: 12/14/16  6:07 AM  Result Value Ref Range Status   Specimen Description URINE, CLEAN CATCH  Final   Special Requests NONE  Final   Culture MULTIPLE SPECIES PRESENT, SUGGEST RECOLLECTION (A)  Final   Report Status 12/15/2016 FINAL  Final     Labs: Basic Metabolic Panel:  Recent Labs Lab 12/13/16 2124 12/14/16 0431 12/15/16 0312 12/16/16 0546 12/17/16 0531  NA 138 139 139 141 140  K 3.8 3.9 3.7 3.6 3.9  CL 109 112* 109 108 106  CO2 24 22 24 25 28   GLUCOSE 121* 96 105* 88 103*  BUN 21*  19 19 19 16   CREATININE 0.59 0.59 0.71 0.68 0.59  CALCIUM 9.0 8.9 8.6* 8.7* 8.7*   Liver Function Tests:  Recent Labs Lab 12/13/16 2124 12/14/16 0431 12/15/16 0312 12/16/16 0546  AST 32 28 26 24   ALT 29 27 25 28   ALKPHOS 54 44 44 46  BILITOT 0.4 0.6 0.5 0.4  PROT 6.2* 6.1* 6.1* 6.2*  ALBUMIN 3.2* 3.2* 3.1* 3.2*    Recent Labs Lab 12/13/16 2130  LIPASE 34   No results for input(s): AMMONIA in the last 168 hours. CBC:  Recent Labs Lab 12/13/16 2124 12/14/16 0431 12/15/16 0506 12/16/16 0546  WBC 5.8 5.5 6.4 8.7  NEUTROABS 2.7  --   --   --   HGB 10.2* 9.5* 9.7* 9.6*  HCT 30.6* 27.7* 28.9* 28.3*  MCV 90.5 90.2 90.6 89.8  PLT 245 219 231 242   Cardiac Enzymes: No results for input(s): CKTOTAL, CKMB, CKMBINDEX, TROPONINI in the last 168 hours. BNP: BNP (last 3 results)  Recent Labs  12/13/16 2124  BNP 72.2    ProBNP (last 3 results) No results for input(s): PROBNP in the last 8760 hours.  CBG:  Recent Labs Lab 12/14/16 0743 12/15/16 0806 12/16/16 0806 12/17/16 0732  GLUCAP 96 93 92 81       Signed:  Gayl Ivanoff MD.  [facility] 12/17/2016, 11:46 AM

## 2016-12-19 DIAGNOSIS — Z5181 Encounter for therapeutic drug level monitoring: Secondary | ICD-10-CM | POA: Diagnosis not present

## 2016-12-19 DIAGNOSIS — E049 Nontoxic goiter, unspecified: Secondary | ICD-10-CM | POA: Diagnosis not present

## 2016-12-19 DIAGNOSIS — D649 Anemia, unspecified: Secondary | ICD-10-CM | POA: Diagnosis not present

## 2016-12-19 DIAGNOSIS — E0590 Thyrotoxicosis, unspecified without thyrotoxic crisis or storm: Secondary | ICD-10-CM | POA: Diagnosis not present

## 2016-12-21 DIAGNOSIS — E0590 Thyrotoxicosis, unspecified without thyrotoxic crisis or storm: Secondary | ICD-10-CM | POA: Diagnosis not present

## 2016-12-21 DIAGNOSIS — R197 Diarrhea, unspecified: Secondary | ICD-10-CM | POA: Diagnosis not present

## 2016-12-21 DIAGNOSIS — E049 Nontoxic goiter, unspecified: Secondary | ICD-10-CM | POA: Diagnosis not present

## 2016-12-21 DIAGNOSIS — D649 Anemia, unspecified: Secondary | ICD-10-CM | POA: Diagnosis not present

## 2016-12-21 DIAGNOSIS — Z5181 Encounter for therapeutic drug level monitoring: Secondary | ICD-10-CM | POA: Diagnosis not present

## 2016-12-26 ENCOUNTER — Encounter: Payer: Self-pay | Admitting: Family Medicine

## 2016-12-26 ENCOUNTER — Other Ambulatory Visit: Payer: Self-pay

## 2016-12-26 ENCOUNTER — Ambulatory Visit (HOSPITAL_COMMUNITY)
Admission: RE | Admit: 2016-12-26 | Discharge: 2016-12-26 | Disposition: A | Discharge status: Home / Self Care | Payer: BLUE CROSS/BLUE SHIELD | Source: Ambulatory Visit | Attending: Family Medicine | Admitting: Family Medicine

## 2016-12-26 ENCOUNTER — Ambulatory Visit: Payer: BLUE CROSS/BLUE SHIELD | Attending: Family Medicine | Admitting: Family Medicine

## 2016-12-26 VITALS — BP 121/81 | HR 78 | Temp 98.4°F | Resp 18 | Ht 71.0 in | Wt 259.2 lb

## 2016-12-26 DIAGNOSIS — H547 Unspecified visual loss: Secondary | ICD-10-CM | POA: Diagnosis not present

## 2016-12-26 DIAGNOSIS — R011 Cardiac murmur, unspecified: Secondary | ICD-10-CM | POA: Insufficient documentation

## 2016-12-26 DIAGNOSIS — E0590 Thyrotoxicosis, unspecified without thyrotoxic crisis or storm: Secondary | ICD-10-CM

## 2016-12-26 DIAGNOSIS — G43909 Migraine, unspecified, not intractable, without status migrainosus: Secondary | ICD-10-CM | POA: Diagnosis not present

## 2016-12-26 DIAGNOSIS — Z8639 Personal history of other endocrine, nutritional and metabolic disease: Secondary | ICD-10-CM | POA: Insufficient documentation

## 2016-12-26 DIAGNOSIS — R911 Solitary pulmonary nodule: Secondary | ICD-10-CM | POA: Diagnosis not present

## 2016-12-26 DIAGNOSIS — M542 Cervicalgia: Secondary | ICD-10-CM | POA: Diagnosis not present

## 2016-12-26 MED ORDER — IBUPROFEN 600 MG PO TABS: 600.0000 mg | ORAL_TABLET | Freq: Three times a day (TID) | ORAL | 0 refills | Status: DC | PRN

## 2016-12-26 MED ORDER — BUTALBITAL-APAP-CAFFEINE 50-325-40 MG PO TABS: 1.0000 | ORAL_TABLET | Freq: Two times a day (BID) | ORAL | 0 refills | Status: DC | PRN

## 2016-12-26 NOTE — Patient Instructions (Addendum)
Migraine Headache A migraine headache is an intense, throbbing pain on one side or both sides of the head. Migraines may also cause other symptoms, such as nausea, vomiting, and sensitivity to light and noise. What are the causes? Doing or taking certain things may also trigger migraines, such as:  Alcohol.  Smoking.  Medicines, such as: ? Medicine used to treat chest pain (nitroglycerine). ? Birth control pills. ? Estrogen pills. ? Certain blood pressure medicines.  Aged cheeses, chocolate, or caffeine.  Foods or drinks that contain nitrates, glutamate, aspartame, or tyramine.  Physical activity.  Other things that may trigger a migraine include:  Menstruation.  Pregnancy.  Hunger.  Stress, lack of sleep, too much sleep, or fatigue.  Weather changes.  What increases the risk? The following factors may make you more likely to experience migraine headaches:  Age. Risk increases with age.  Family history of migraine headaches.  Being Caucasian.  Depression and anxiety.  Obesity.  Being a woman.  Having a hole in the heart (patent foramen ovale) or other heart problems.  What are the signs or symptoms? The main symptom of this condition is pulsating or throbbing pain. Pain may:  Happen in any area of the head, such as on one side or both sides.  Interfere with daily activities.  Get worse with physical activity.  Get worse with exposure to bright lights or loud noises.  Other symptoms may include:  Nausea.  Vomiting.  Dizziness.  General sensitivity to bright lights, loud noises, or smells.  Before you get a migraine, you may get warning signs that a migraine is developing (aura). An aura may include:  Seeing flashing lights or having blind spots.  Seeing bright spots, halos, or zigzag lines.  Having tunnel vision or blurred vision.  Having numbness or a tingling feeling.  Having trouble talking.  Having muscle weakness.  How is this  diagnosed? A migraine headache can be diagnosed based on:  Your symptoms.  A physical exam.  Tests, such as CT scan or MRI of the head. These imaging tests can help rule out other causes of headaches.  Taking fluid from the spine (lumbar puncture) and analyzing it (cerebrospinal fluid analysis, or CSF analysis).  How is this treated? A migraine headache is usually treated with medicines that:  Relieve pain.  Relieve nausea.  Prevent migraines from coming back.  Treatment may also include:  Acupuncture.  Lifestyle changes like avoiding foods that trigger migraines.  Follow these instructions at home: Medicines  Take over-the-counter and prescription medicines only as told by your health care provider.  Do not drive or use heavy machinery while taking prescription pain medicine.  To prevent or treat constipation while you are taking prescription pain medicine, your health care provider may recommend that you: ? Drink enough fluid to keep your urine clear or pale yellow. ? Take over-the-counter or prescription medicines. ? Eat foods that are high in fiber, such as fresh fruits and vegetables, whole grains, and beans. ? Limit foods that are high in fat and processed sugars, such as fried and sweet foods. Lifestyle  Avoid alcohol use.  Do not use any products that contain nicotine or tobacco, such as cigarettes and e-cigarettes. If you need help quitting, ask your health care provider.  Get at least 8 hours of sleep every night.  Limit your stress. General instructions   Keep a journal to find out what may trigger your migraine headaches. For example, write down: ? What you eat and   drink. ? How much sleep you get. ? Any change to your diet or medicines.  If you have a migraine: ? Avoid things that make your symptoms worse, such as bright lights. ? It may help to lie down in a dark, quiet room. ? Do not drive or use heavy machinery. ? Ask your health care provider  what activities are safe for you while you are experiencing symptoms.  Keep all follow-up visits as told by your health care provider. This is important. Contact a health care provider if:  You develop symptoms that are different or more severe than your usual migraine symptoms. Get help right away if:  Your migraine becomes severe.  You have a fever.  You have a stiff neck.  You have vision loss.  Your muscles feel weak or like you cannot control them.  You start to lose your balance often.  You develop trouble walking.  You faint. This information is not intended to replace advice given to you by your health care provider. Make sure you discuss any questions you have with your health care provider. Document Released: 04/09/2005 Document Revised: 10/28/2015 Document Reviewed: 09/26/2015 Elsevier Interactive Patient Education  2017 [employer]   

## 2016-12-26 NOTE — Progress Notes (Signed)
Subjective:  Patient ID: Anne [name], female    DOB: Feb 15, 1981  Age: 36 y.o. MRN: [id number]  CC: Establish Care   HPI Anne Julyatasha R Detwiler presents for recent hospital admission for dyspnea, BLE swelling x 2 days, CP, palpitations, dizziness, N/V/D, throat swelling, fatigue, and weakness. TSH level were found to be less than 0.01. Elevated free T4 of 5.1 and T3 of 596. Diagnosed with severe thyrotoxicosis with impending thyroid storm. Dr. Sharl MaKerr endocrinologist consulted she placed on regimen of PTU, propanolol, hydrocortisone, questran, and iodine while impatient. She was d/c on propanolol, hydrocortisone, and methimazole. She was  encouraged to follow up with Dr. Sharl MaKerr in 1 week for follow up. She reports following up Wednesday. She does report family history of thyroid disease mother and MGM. During admission echocardiogram was performed. EF 65-70% and possible subvalvular membrane. Patient was encouraged to follow up with PCP in 1 week and for outpatient cardiology consult recommended in 1 month. She reports that she went back to work yesterday. She reports headache. Symptoms began about 6 days ago. Generally, the headaches continuous.  The headaches are usually severe and are right-sided unilateral in location.  The patient rates her most severe headaches a 9 on a scale from 1 to 10.She denies any worsening headaches. Precipitating factors include none which have been determined. The headaches are usually not preceded by an aura. The patient denies loss of balance, numbness of extremities, speech difficulties and vomiting in the early morning. Other associated symptoms include: nausea and blurred vision. Home treatment has included ibuprofen with poor improvement. Patient was observed in what appeared to be voice recording office visit from her cellphone without provider's knowledge.    Outpatient Medications Prior to Visit  Medication Sig Dispense Refill  . ergocalciferol (VITAMIN D2) 50000  UNITS capsule Take 1 capsule (50,000 Units total) by mouth once a week. (Patient not taking: Reported on 12/13/2016) 12 capsule 0  . hydrocortisone (CORTEF) 5 MG tablet Take 1-2 tablets (5-10 mg total) by mouth 2 (two) times daily. Take 10mg  in the morning and 5mg  in the evening 60 tablet 0  . methimazole (TAPAZOLE) 10 MG tablet Take 6 tablets (60 mg total) by mouth daily. 120 tablet 0  . propranolol (INDERAL) 60 MG tablet Take 1 tablet (60 mg total) by mouth 2 (two) times daily with a meal. 60 tablet 0  . ibuprofen (ADVIL,MOTRIN) 200 MG tablet Take 1 tablet (200 mg total) by mouth every 6 (six) hours as needed for headache, mild pain or moderate pain. for pain     No facility-administered medications prior to visit.     ROS Review of Systems  Constitutional: Negative.   HENT: Negative.   Eyes: Positive for visual disturbance.  Respiratory: Negative.   Cardiovascular: Negative.   Gastrointestinal: Negative.   Skin: Negative.   Neurological: Positive for headaches.  Psychiatric/Behavioral: Negative.    Objective:  BP 121/81 (BP Location: Left Arm, Patient Position: Sitting, Cuff Size: Normal)   Pulse 78   Temp 98.4 F (36.9 C) (Oral)   Resp 18   Ht 5\' 11"  (1.803 m)   Wt 259 lb 3.2 oz (117.6 kg)   LMP 12/06/2016 (Approximate)   SpO2 100%   BMI 36.15 kg/m   BP/Weight 12/26/2016 12/17/2016 12/15/2016  Systolic BP 121 136 -  Diastolic BP 81 78 -  Wt. (Lbs) 259.2 - 257.94  BMI 36.15 - 35.98   Physical Exam  Constitutional: She is oriented to person, place, and time. She appears  well-developed and well-nourished.  HENT:  Head: Normocephalic and atraumatic.  Right Ear: External ear normal.  Left Ear: External ear normal.  Nose: Nose normal.  Mouth/Throat: Oropharynx is clear and moist.  Eyes: Pupils are equal, round, and reactive to light. Conjunctivae and EOM are normal.  Neck: Normal range of motion. Neck supple.  Cardiovascular: Normal rate, regular rhythm, normal heart sounds  and intact distal pulses.   Pulmonary/Chest: Effort normal and breath sounds normal.  Abdominal: Soft. Bowel sounds are normal. There is no tenderness.  Neurological: She is alert and oriented to person, place, and time. She has normal reflexes. No cranial nerve deficit.  Skin: Skin is warm and dry.  Psychiatric: She has a normal mood and affect.  Nursing note and vitals reviewed.  Assessment & Plan:   Problem List Items Addressed This Visit      Other   Murmur, heart   Relevant Orders   Ambulatory referral to Cardiology    Other Visit Diagnoses    Hyperthyroidism    -  Primary   Continue to follow up with endocrinologist and their recommendations   History of thyrotoxicosis       Relevant Orders   EKG 12-Lead   Migraine without status migrainosus, not intractable, unspecified migraine type       Relevant Medications   butalbital-acetaminophen-caffeine (FIORICET, ESGIC) 50-325-40 MG tablet   ibuprofen (ADVIL,MOTRIN) 600 MG tablet   Decreased visual acuity       Visual acuity screen performed   Relevant Orders   Ambulatory referral to Ophthalmology      Meds ordered this encounter  Medications  . butalbital-acetaminophen-caffeine (FIORICET, ESGIC) 50-325-40 MG tablet    Sig: Take 1 tablet by mouth 2 (two) times daily as needed for migraine.    Dispense:  14 tablet    Refill:  0    Order Specific Question:   Supervising Provider    Answer:   Quentin Angst [id number]  . ibuprofen (ADVIL,MOTRIN) 600 MG tablet    Sig: Take 1 tablet (600 mg total) by mouth every 8 (eight) hours as needed for headache (Take with food.).    Dispense:  30 tablet    Refill:  0    Order Specific Question:   Supervising Provider    Answer:   Quentin Angst [id number]    Follow-up: Return in about 8 weeks (around 02/20/2017), or if symptoms worsen or fail to improve, for Headache.   Lizbeth Bark FNP

## 2016-12-26 NOTE — Progress Notes (Signed)
Patient is here for left side head sharp pain

## 2017-01-04 DIAGNOSIS — R600 Localized edema: Secondary | ICD-10-CM | POA: Diagnosis not present

## 2017-01-04 DIAGNOSIS — D649 Anemia, unspecified: Secondary | ICD-10-CM | POA: Diagnosis not present

## 2017-01-04 DIAGNOSIS — E0590 Thyrotoxicosis, unspecified without thyrotoxic crisis or storm: Secondary | ICD-10-CM | POA: Diagnosis not present

## 2017-01-04 DIAGNOSIS — R0602 Shortness of breath: Secondary | ICD-10-CM | POA: Diagnosis not present

## 2017-01-07 DIAGNOSIS — R7989 Other specified abnormal findings of blood chemistry: Secondary | ICD-10-CM | POA: Diagnosis not present

## 2017-01-07 DIAGNOSIS — K1120 Sialoadenitis, unspecified: Secondary | ICD-10-CM | POA: Diagnosis not present

## 2017-01-07 DIAGNOSIS — R600 Localized edema: Secondary | ICD-10-CM | POA: Diagnosis not present

## 2017-01-07 DIAGNOSIS — E0590 Thyrotoxicosis, unspecified without thyrotoxic crisis or storm: Secondary | ICD-10-CM | POA: Diagnosis not present

## 2017-01-08 ENCOUNTER — Encounter: Payer: Self-pay | Admitting: Physician Assistant

## 2017-01-08 NOTE — Progress Notes (Signed)
Cardiology Office Note    Date:  01/09/2017  ID:  Anne [name], DOB Sep 25, 1980, MRN [id number] PCP:  Anne Bark, FNP  Cardiologist:  Anne [name], reviewed with Dr. Katrinka [name]   Chief Complaint: abnormal echo  History of Present Illness:  Anne [name] is a 36 y.o. female with history of asthma, obesity, recently diagnosed impending thyroid storm, normocytic anemia, increased LVOT gradient, pulm nodule who is being seen today for the evaluation of abnormal echo at the request of Dr. Jomarie [name] and Dr. Jenelle [name].   The patient was recently admitted 11/2016 with dyspnea, swelling, nausea, vomiting, palpitatons, dizziness, diarrhea, throat swelling, chest painm fatigue and weakness for several days. Found to have severe thyrotoxicosis and started on PTU, Propranolol, Hydrocortisone, Questran and Iodine. DC summary also indicates pt required few doses of diuretic for what was felt to be volume overload. Also found to have normocytic anemia felt possibly mediated by thyroid issue. BP also appeared borderline elevated at times. 2D echo 11/2016 showed EF 65-70%, mild MR, mild LAE, moderate TR, "significant gradient across LVOT, aortic valve opens well. There is a possible subvalvular membrane. Further evaluation with cardiac CTA is recommended." Case was discussed with Dr. Antoine [name] who recommended OP workup. Last labs otherwise notable for K 3.9, glucose 103, BUN 16, Cr 0.59, Hgb 9.6 (previously 13.1 in 2016), d-dimer 1.07, albumin 3.2. CT angio chest without PE, + 3mm pulm nodule with recommendation to consider repeat CT chest 12 months.  She reports overall since discharge she's been feeling much better. Last week, however, she did notice recurrence of lower extremity edema. She states her [facility] PCP Anne [name] increased Lasix from  to  on 01/04/17. She brought in labs from 01/04/17 by Anne [name] showing BNP 371 (previously normal), Hgb 10.2, ferritin and iron panel wnl except low transferrin, BUN 10,  Cr 0.62, K 4.3, Cl 111, Co2 23, AST 46, ALT 45. She reports LEE has improved but does have increase in SOB when lying down at night. She gets a sensation of chest discomfort when lying on her left side at night. No exertional CP or dyspnea. She also awakens now regularly in the middle of the night with insomnia, not necessarily related to breathing.  Past Medical History:  Diagnosis Date  . Abnormal echocardiogram    a. 11/2016 - increased LVOT gradient.  . Asthma   . Mild mitral regurgitation   . Normocytic anemia   . Obesity   . Thyrotoxicosis 11/2014  . Tobacco abuse   . Tricuspid regurgitation     Past Surgical History:  Procedure Laterality Date  . ECTOPIC PREGNANCY SURGERY    . MANDIBLE SURGERY    . TUBAL LIGATION      Current Medications: Current Meds  Medication Sig  . butalbital-acetaminophen-caffeine (FIORICET, ESGIC) 50-325-40 MG tablet Take 1 tablet by mouth 2 (two) times daily as needed for migraine.  . cyclobenzaprine (FLEXERIL) 5 MG tablet Take 5 mg by mouth at bedtime.  . furosemide (LASIX) 20 MG tablet Take 20 mg by mouth daily.  Marland Kitchen HYDROcodone-acetaminophen (NORCO) 7.5-325 MG tablet Take 1 tablet by mouth every 6 (six) hours as needed for painful procedure/intervention.  Marland Kitchen ibuprofen (ADVIL,MOTRIN) 600 MG tablet Take 1 tablet (600 mg total) by mouth every 8 (eight) hours as needed for headache (Take with food.).  Marland Kitchen methimazole (TAPAZOLE) 10 MG tablet Take 6 tablets (60 mg total) by mouth daily.  . propranolol (INDERAL) 80 MG tablet Take 80 mg by mouth 2 (two) times  daily.  . [DISCONTINUED] hydrocortisone (CORTEF) 5 MG tablet Take 1-2 tablets (5-10 mg total) by mouth 2 (two) times daily. Take  in the morning and  in the evening     Allergies:   Food allergy formula and Shellfish allergy   Social History   Social History  . Marital status: Legally Separated    Spouse name: N/A  . Number of children: N/A  . Years of education: N/A   Social History Main  Topics  . Smoking status: Current Every Day Smoker    Packs/day: 1.00    Years: 10.00    Types: Cigarettes  . Smokeless tobacco: Never Used  . Alcohol use 0.6 oz/week    1 Glasses of wine per week     Comment: occasion - 1 glass of wine per week  . Drug use: No  . Sexual activity: Yes    Birth control/ protection: None   Other Topics Concern  . Not on file   Social History Narrative  . No narrative on file     Family History:  Family History  Problem Relation Age of Onset  . Hypertension Father   . Thyroid disease Mother   . Diabetes Maternal Grandmother   . Thyroid disease Maternal Grandmother   . Diabetes Maternal Aunt   . Diabetes Maternal Aunt   . Kidney disease Maternal Aunt    ROS:   Please see the history of present illness.  All other systems are reviewed and otherwise negative.    PHYSICAL EXAM:   VS:  BP (!) 152/92 (BP Location: Left Arm)   Pulse 86   Ht  (1.803 m)   Wt 255 lb 1.9 oz (115.7 kg)   LMP 12/31/2016 (LMP Unknown)   BMI 35.58 kg/m   BMI: Body mass index is 35.58 kg/m. Recheck by me was 142/90 GEN: Well nourished, well developed obese AAF, in no acute distress  HEENT: normocephalic, atraumatic Neck: no JVD, carotid bruits, or masses Cardiac: RRR; soft SEM at LSB, no rub or gallops, no edema  Respiratory:  clear to auscultation bilaterally, normal work of breathing GI: soft, nontender, nondistended, + BS MS: no deformity or atrophy  Skin: warm and dry, no rash Neuro:  Alert and Oriented x 3, Strength and sensation are intact, follows commands Psych: euthymic mood, full affect  Wt Readings from Last 3 Encounters:  01/09/17 255 lb 1.9 oz (115.7 kg)  12/26/16 259 lb 3.2 oz (117.6 kg)  12/15/16 257 lb 15 oz (117 kg)      Studies/Labs Reviewed:   EKG:  EKG was ordered today and personally reviewed by me and demonstrates NSR 86bpm, no acute changes.   Recent Labs: 12/13/2016: B Natriuretic Peptide 72.2; TSH <0.010 12/16/2016: ALT  28; Hemoglobin 9.6; Platelets 242 12/17/2016: BUN 16; Creatinine, Ser 0.59; Potassium 3.9; Sodium 140   Lipid Panel No results found for: CHOL, TRIG, HDL, CHOLHDL, VLDL, LDLCALC, LDLDIRECT  Additional studies/ records that were reviewed today include: Summarized above    ASSESSMENT & PLAN:   This patient's case was discussed in depth with Dr. Katrinka [name]. The plan below was formulated per our discussion.  1. Abnormal echo with increased LVOT gradient, possible subvalvular membrane, mild MR/moderate TR - per discussion with Dr. Katrinka [name], suspect increased gradient on echo is due to increased metabolic demand from thyrotoxicosis. He does not feel cardiac CT is warranted at present time but does recommend having her return to clinic in several months to consider repeat echocardiogram once euthyroid.  She is reporting some increased SOB at night and atypical chest discomfort which may represent orthopnea with residual volume overload. She has not had any exertional CP and EKG is reassuring. Recent repeat BNP was elevated compared to hospital stay. She reports her PCP increased Lasix from  to  on 9/14. She has noticed her blood pressure running slightly higher today. Recheck by me was 142/90. Per d/w MD, will plan to increase Lasix to  daily. She reports she has labwork at [facility] office on Friday but I'm not sure what they have ordered, and that is only 2 days away, so will obtain BMET in our office in approximately 1 week. Reviewed sodium restriction and 64oz fluid restriction with patient. I did tell her if SOB resolved and blood pressure went normal-low to reduce Lasix back to  daily. She also knows to call us for any questions. She is also being closely followed by endocrinology as well. 2. Normocytic anemia - further monitoring by primary care. Could be contributing to dyspnea as well. Last check showed stable Hgb with subtle trend upward. 3. Pulm nodule - will defer further management of this  to primary care. Incidentally noted during recent hospitalization. I will forward this note to PCP. 4. BP elevated without h/o HTN - suspect this has been driven by recent thyroid issues, but cannot exclude underlying baseline HTN as some prior blood pressures in EP[facility] were in the 130-160 range. However, these were also in the setting of suppressed TSH without patient follow-through at that time. Follow with increase in Lasix. 5. Tobacco abuse - counseled on importance of cessation.  Disposition: F/u with Dr. Katrinka [name] in 3 months.  Medication Adjustments/Labs and Tests Ordered: Current medicines are reviewed at length with the patient today.  Concerns regarding medicines are outlined above. Medication changes, Labs and Tests ordered today are summarized above and listed in the Patient Instructions accessible in Encounters.   Signed, Laurann Montana, PA-C  01/09/2017 9:38 AM    [facility] [address] [place], [place], [place]  [address] Phone: [phone]; Fax: [phone]

## 2017-01-09 ENCOUNTER — Ambulatory Visit (INDEPENDENT_AMBULATORY_CARE_PROVIDER_SITE_OTHER): Payer: BLUE CROSS/BLUE SHIELD | Admitting: Physician Assistant

## 2017-01-09 ENCOUNTER — Encounter: Payer: Self-pay | Admitting: Physician Assistant

## 2017-01-09 VITALS — BP 152/92 | HR 86 | Ht 71.0 in | Wt 255.1 lb

## 2017-01-09 DIAGNOSIS — I340 Nonrheumatic mitral (valve) insufficiency: Secondary | ICD-10-CM | POA: Diagnosis not present

## 2017-01-09 DIAGNOSIS — R931 Abnormal findings on diagnostic imaging of heart and coronary circulation: Secondary | ICD-10-CM

## 2017-01-09 DIAGNOSIS — I071 Rheumatic tricuspid insufficiency: Secondary | ICD-10-CM

## 2017-01-09 DIAGNOSIS — Z720 Tobacco use: Secondary | ICD-10-CM

## 2017-01-09 DIAGNOSIS — D649 Anemia, unspecified: Secondary | ICD-10-CM | POA: Diagnosis not present

## 2017-01-09 DIAGNOSIS — R030 Elevated blood-pressure reading, without diagnosis of hypertension: Secondary | ICD-10-CM | POA: Diagnosis not present

## 2017-01-09 DIAGNOSIS — R911 Solitary pulmonary nodule: Secondary | ICD-10-CM

## 2017-01-09 MED ORDER — FUROSEMIDE 20 MG PO TABS: 60.0000 mg | ORAL_TABLET | Freq: Every day | ORAL | 1 refills | Status: DC

## 2017-01-09 NOTE — Patient Instructions (Addendum)
Medication Instructions:  Your physician has recommended you make the following change in your medication:  1.  INCREASE the Lasix to 20 mg taking 3 tablets daily.  Labwork: 1 WEEK:  BMET  Testing/Procedures: None ordered  Follow-Up: Your physician recommends that you schedule a follow-up appointment in: 3 MONTHS WITH DR. Katrinka Blazing    Any Other Special Instructions Will Be Listed Below (If Applicable).     If you need a refill on your cardiac medications before your next appointment, please call your pharmacy.

## 2017-01-11 DIAGNOSIS — Z5181 Encounter for therapeutic drug level monitoring: Secondary | ICD-10-CM | POA: Diagnosis not present

## 2017-01-11 DIAGNOSIS — R600 Localized edema: Secondary | ICD-10-CM | POA: Diagnosis not present

## 2017-01-11 DIAGNOSIS — E0590 Thyrotoxicosis, unspecified without thyrotoxic crisis or storm: Secondary | ICD-10-CM | POA: Diagnosis not present

## 2017-01-16 ENCOUNTER — Other Ambulatory Visit: Payer: BLUE CROSS/BLUE SHIELD

## 2017-01-23 ENCOUNTER — Other Ambulatory Visit: Payer: Self-pay | Admitting: Family Medicine

## 2017-01-23 DIAGNOSIS — G43909 Migraine, unspecified, not intractable, without status migrainosus: Secondary | ICD-10-CM

## 2017-02-05 ENCOUNTER — Other Ambulatory Visit: Payer: Self-pay

## 2017-02-05 MED ORDER — FUROSEMIDE 20 MG PO TABS: 60.0000 mg | ORAL_TABLET | Freq: Every day | ORAL | 3 refills | Status: DC

## 2017-02-18 DIAGNOSIS — E0590 Thyrotoxicosis, unspecified without thyrotoxic crisis or storm: Secondary | ICD-10-CM | POA: Diagnosis not present

## 2017-02-19 NOTE — Progress Notes (Deleted)
Pulmonary nodule-pulm. ref Normocytic anemia If SOb resolved and BP normal dec lasix to 40 Migraine improv?

## 2017-02-20 ENCOUNTER — Ambulatory Visit: Payer: BLUE CROSS/BLUE SHIELD | Admitting: Family Medicine

## 2017-02-21 DIAGNOSIS — E0500 Thyrotoxicosis with diffuse goiter without thyrotoxic crisis or storm: Secondary | ICD-10-CM | POA: Diagnosis not present

## 2017-02-21 DIAGNOSIS — Z5181 Encounter for therapeutic drug level monitoring: Secondary | ICD-10-CM | POA: Diagnosis not present

## 2017-02-21 DIAGNOSIS — E049 Nontoxic goiter, unspecified: Secondary | ICD-10-CM | POA: Diagnosis not present

## 2017-02-21 DIAGNOSIS — E0590 Thyrotoxicosis, unspecified without thyrotoxic crisis or storm: Secondary | ICD-10-CM | POA: Diagnosis not present

## 2017-03-18 DIAGNOSIS — E0500 Thyrotoxicosis with diffuse goiter without thyrotoxic crisis or storm: Secondary | ICD-10-CM | POA: Diagnosis not present

## 2017-04-10 ENCOUNTER — Ambulatory Visit: Payer: BLUE CROSS/BLUE SHIELD | Admitting: Interventional Cardiology

## 2017-05-07 DIAGNOSIS — E0500 Thyrotoxicosis with diffuse goiter without thyrotoxic crisis or storm: Secondary | ICD-10-CM | POA: Diagnosis not present

## 2017-05-26 NOTE — Progress Notes (Deleted)
Cardiology Office Note    Date:  05/26/2017   ID:  Anne Julyatasha R Mcgrail, DOB 1980-05-15, MRN [id number]  PCP:  Lizbeth BarkHairston, Mandesia R, FNP  Cardiologist: Lesleigh NoeHenry W Viktorya Arguijo III, MD   No chief complaint on file.   History of Present Illness:  Anne [name] is a 37 y.o. female with history of asthma, obesity, recently diagnosed impending thyroid storm, normocytic anemia, increased LVOT gradient, pulm nodule who is being seen today for the evaluation of abnormal echo.      Past Medical History:  Diagnosis Date  . Abnormal echocardiogram    a. 11/2016 - increased LVOT gradient.  . Asthma   . Mild mitral regurgitation   . Normocytic anemia   . Obesity   . Thyrotoxicosis 11/2014  . Tobacco abuse   . Tricuspid regurgitation     Past Surgical History:  Procedure Laterality Date  . ECTOPIC PREGNANCY SURGERY    . MANDIBLE SURGERY    . TUBAL LIGATION      Current Medications: Outpatient Medications Prior to Visit  Medication Sig Dispense Refill  . butalbital-acetaminophen-caffeine (FIORICET, ESGIC) 50-325-40 MG tablet Take 1 tablet by mouth 2 (two) times daily as needed for migraine. 14 tablet 0  . cyclobenzaprine (FLEXERIL) 5 MG tablet Take 5 mg by mouth at bedtime.  0  . furosemide (LASIX) 20 MG tablet Take 3 tablets (60 mg total) by mouth daily. 270 tablet 3  . HYDROcodone-acetaminophen (NORCO) 7.5-325 MG tablet Take 1 tablet by mouth every 6 (six) hours as needed for painful procedure/intervention.  0  . ibuprofen (ADVIL,MOTRIN) 600 MG tablet TAKE 1 TABLET BY MOUTH EVERY 8 HOURS AS NEEDED FOR HEADACHE. TAKE WITH FOOD 30 tablet 0  . methimazole (TAPAZOLE) 10 MG tablet Take 6 tablets (60 mg total) by mouth daily. 120 tablet 0  . propranolol (INDERAL) 80 MG tablet Take 80 mg by mouth 2 (two) times daily.  0   No facility-administered medications prior to visit.      Allergies:   Food allergy formula and Shellfish allergy   Social History   Socioeconomic History  . Marital  status: Legally Separated    Spouse name: Not on file  . Number of children: Not on file  . Years of education: Not on file  . Highest education level: Not on file  Social Needs  . Financial resource strain: Not on file  . Food insecurity - worry: Not on file  . Food insecurity - inability: Not on file  . Transportation needs - medical: Not on file  . Transportation needs - non-medical: Not on file  Occupational History  . Not on file  Tobacco Use  . Smoking status: Current Every Day Smoker    Packs/day: 1.00    Years: 10.00    Pack years: 10.00    Types: Cigarettes  . Smokeless tobacco: Never Used  Substance and Sexual Activity  . Alcohol use: Yes    Alcohol/week: 0.6 oz    Types: 1 Glasses of wine per week    Comment: occasion - 1 glass of wine per week  . Drug use: No  . Sexual activity: Yes    Birth control/protection: None  Other Topics Concern  . Not on file  Social History Narrative  . Not on file     Family History:  The patient's ***family history includes Diabetes in her maternal aunt, maternal aunt, and maternal grandmother; Hypertension in her father; Kidney disease in her maternal aunt; Thyroid disease in her  maternal grandmother and mother.   ROS:   Please see the history of present illness.    ***  All other systems reviewed and are negative.   PHYSICAL EXAM:   VS:  There were no vitals taken for this visit.   GEN: Well nourished, well developed, in no acute distress  HEENT: normal  Neck: no JVD, carotid bruits, or masses Cardiac: ***RRR; no murmurs, rubs, or gallops,no edema  Respiratory:  clear to auscultation bilaterally, normal work of breathing GI: soft, nontender, nondistended, + BS MS: no deformity or atrophy  Skin: warm and dry, no rash Neuro:  Alert and Oriented x 3, Strength and sensation are intact Psych: euthymic mood, full affect  Wt Readings from Last 3 Encounters:  01/09/17 255 lb 1.9 oz (115.7 kg)  12/26/16 259 lb 3.2 oz (117.6  kg)  12/15/16 257 lb 15 oz (117 kg)      Studies/Labs Reviewed:   EKG:  EKG  ***  Recent Labs: 12/13/2016: B Natriuretic Peptide 72.2; TSH <0.010 12/16/2016: ALT 28; Hemoglobin 9.6; Platelets 242 12/17/2016: BUN 16; Creatinine, Ser 0.59; Potassium 3.9; Sodium 140   Lipid Panel No results found for: CHOL, TRIG, HDL, CHOLHDL, VLDL, LDLCALC, LDLDIRECT  Additional studies/ records that were reviewed today include:   12/14/2016 ECHOCARDIOGRAM Study Conclusions   - Left ventricle: The cavity size was normal. Systolic function was   vigorous. The estimated ejection fraction was in the range of 65%   to 70%. Wall motion was normal; there were no regional wall   motion abnormalities. Left ventricular diastolic function   parameters were normal. - Aortic valve: Trileaflet; normal thickness leaflets. Mean   gradient (S): 37 mm Hg. Peak gradient (S): 69 mm Hg. Valve area   (VTI): 0.94 cm^2. Valve area (Vmax): 0.89 cm^2. Valve area   (Vmean): 1.04 cm^2. - Mitral valve: There was mild regurgitation. - Left atrium: The atrium was mildly dilated. - Right ventricle: The cavity size was normal. Wall thickness was   normal. Systolic function was normal. - Right atrium: The atrium was normal in size. - Tricuspid valve: There was moderate regurgitation. - Pulmonic valve: There was no regurgitation. - Pulmonary arteries: Systolic pressure was within the normal   range. - Inferior vena cava: The vessel was normal in size. - Pericardium, extracardiac: There was no pericardial effusion.   Impressions:   - There is significant gradient across LVOT, aortic valve opens   well. There is a possible subvalvular membrane. Further   evaluation with cardiac CTA is recommended.      ASSESSMENT:    1. Murmur, heart   2. Other thyrotoxicosis without thyrotoxic crisis or storm      PLAN:  In order of problems listed above:  1. ***    Medication Adjustments/Labs and Tests Ordered: Current  medicines are reviewed at length with the patient today.  Concerns regarding medicines are outlined above.  Medication changes, Labs and Tests ordered today are listed in the Patient Instructions below. There are no Patient Instructions on file for this visit.   Signed, Lesleigh Noe, MD  05/26/2017 8:51 AM    [facility] [address] [place], [place], [place]  [address] Phone: [phone]; Fax: [phone]

## 2017-05-27 ENCOUNTER — Ambulatory Visit: Payer: BLUE CROSS/BLUE SHIELD | Admitting: Interventional Cardiology

## 2017-06-23 NOTE — Progress Notes (Deleted)
Cardiology Office Note    Date:  06/23/2017   ID:  Anne [name], DOB 1980/05/02, MRN [id number]  PCP:  Lizbeth BarkHairston, Mandesia R, FNP  Cardiologist: Lesleigh NoeHenry W Smith III, MD   No chief complaint on file.   History of Present Illness:  Anne [name] is a 37 y.o. female with history of asthma, obesity, recently diagnosed impending thyroid storm, normocytic anemia, increased LVOT gradient, pulm nodule who is being seen today for the evaluation of abnormal echo at the request of Dr. Jomarie LongsJoseph and Dr. Jenelle MagesHairston      Past Medical History:  Diagnosis Date  . Abnormal echocardiogram    a. 11/2016 - increased LVOT gradient.  . Asthma   . Mild mitral regurgitation   . Normocytic anemia   . Obesity   . Thyrotoxicosis 11/2014  . Tobacco abuse   . Tricuspid regurgitation     Past Surgical History:  Procedure Laterality Date  . ECTOPIC PREGNANCY SURGERY    . MANDIBLE SURGERY    . TUBAL LIGATION      Current Medications: Outpatient Medications Prior to Visit  Medication Sig Dispense Refill  . butalbital-acetaminophen-caffeine (FIORICET, ESGIC) 50-325-40 MG tablet Take 1 tablet by mouth 2 (two) times daily as needed for migraine. 14 tablet 0  . cyclobenzaprine (FLEXERIL) 5 MG tablet Take 5 mg by mouth at bedtime.  0  . furosemide (LASIX) 20 MG tablet Take 3 tablets (60 mg total) by mouth daily. 270 tablet 3  . HYDROcodone-acetaminophen (NORCO) 7.5-325 MG tablet Take 1 tablet by mouth every 6 (six) hours as needed for painful procedure/intervention.  0  . ibuprofen (ADVIL,MOTRIN) 600 MG tablet TAKE 1 TABLET BY MOUTH EVERY 8 HOURS AS NEEDED FOR HEADACHE. TAKE WITH FOOD 30 tablet 0  . methimazole (TAPAZOLE) 10 MG tablet Take 6 tablets (60 mg total) by mouth daily. 120 tablet 0  . propranolol (INDERAL) 80 MG tablet Take 80 mg by mouth 2 (two) times daily.  0   No facility-administered medications prior to visit.      Allergies:   Food allergy formula and Shellfish allergy   Social  History   Socioeconomic History  . Marital status: Legally Separated    Spouse name: Not on file  . Number of children: Not on file  . Years of education: Not on file  . Highest education level: Not on file  Social Needs  . Financial resource strain: Not on file  . Food insecurity - worry: Not on file  . Food insecurity - inability: Not on file  . Transportation needs - medical: Not on file  . Transportation needs - non-medical: Not on file  Occupational History  . Not on file  Tobacco Use  . Smoking status: Current Every Day Smoker    Packs/day: 1.00    Years: 10.00    Pack years: 10.00    Types: Cigarettes  . Smokeless tobacco: Never Used  Substance and Sexual Activity  . Alcohol use: Yes    Alcohol/week: 0.6 oz    Types: 1 Glasses of wine per week    Comment: occasion - 1 glass of wine per week  . Drug use: No  . Sexual activity: Yes    Birth control/protection: None  Other Topics Concern  . Not on file  Social History Narrative  . Not on file     Family History:  The patient's ***family history includes Diabetes in her maternal aunt, maternal aunt, and maternal grandmother; Hypertension in her father; Kidney  disease in her maternal aunt; Thyroid disease in her maternal grandmother and mother.   ROS:   Please see the history of present illness.    ***  All other systems reviewed and are negative.   PHYSICAL EXAM:   VS:  There were no vitals taken for this visit.   GEN: Well nourished, well developed, in no acute distress  HEENT: normal  Neck: no JVD, carotid bruits, or masses Cardiac: ***RRR; no murmurs, rubs, or gallops,no edema  Respiratory:  clear to auscultation bilaterally, normal work of breathing GI: soft, nontender, nondistended, + BS MS: no deformity or atrophy  Skin: warm and dry, no rash Neuro:  Alert and Oriented x 3, Strength and sensation are intact Psych: euthymic mood, full affect  Wt Readings from Last 3 Encounters:  01/09/17 255 lb 1.9  oz (115.7 kg)  12/26/16 259 lb 3.2 oz (117.6 kg)  12/15/16 257 lb 15 oz (117 kg)      Studies/Labs Reviewed:   EKG:  EKG  ***  Recent Labs: 12/13/2016: B Natriuretic Peptide 72.2; TSH <0.010 12/16/2016: ALT 28; Hemoglobin 9.6; Platelets 242 12/17/2016: BUN 16; Creatinine, Ser 0.59; Potassium 3.9; Sodium 140   Lipid Panel No results found for: CHOL, TRIG, HDL, CHOLHDL, VLDL, LDLCALC, LDLDIRECT  Additional studies/ records that were reviewed today include:  ***    ASSESSMENT:    1. Blood pressure elevated without history of HTN   2. Murmur, heart      PLAN:  In order of problems listed above:  1. ***    Medication Adjustments/Labs and Tests Ordered: Current medicines are reviewed at length with the patient today.  Concerns regarding medicines are outlined above.  Medication changes, Labs and Tests ordered today are listed in the Patient Instructions below. There are no Patient Instructions on file for this visit.   Signed, Lesleigh Noe, MD  06/23/2017 9:25 PM    [facility] [address] [place], [place], [place]  [address] Phone: [phone]; Fax: [phone]

## 2017-06-24 ENCOUNTER — Ambulatory Visit: Payer: BLUE CROSS/BLUE SHIELD | Admitting: Interventional Cardiology

## 2017-06-24 DIAGNOSIS — R0989 Other specified symptoms and signs involving the circulatory and respiratory systems: Secondary | ICD-10-CM

## 2018-03-10 DIAGNOSIS — M79606 Pain in leg, unspecified: Secondary | ICD-10-CM | POA: Diagnosis not present

## 2018-03-10 DIAGNOSIS — M545 Low back pain: Secondary | ICD-10-CM | POA: Diagnosis not present

## 2018-03-17 DIAGNOSIS — M5410 Radiculopathy, site unspecified: Secondary | ICD-10-CM | POA: Diagnosis not present

## 2018-03-19 DIAGNOSIS — M542 Cervicalgia: Secondary | ICD-10-CM | POA: Diagnosis not present

## 2018-03-19 DIAGNOSIS — M545 Low back pain: Secondary | ICD-10-CM | POA: Diagnosis not present

## 2018-03-24 DIAGNOSIS — N760 Acute vaginitis: Secondary | ICD-10-CM | POA: Diagnosis not present

## 2018-03-24 DIAGNOSIS — Z6836 Body mass index (BMI) 36.0-36.9, adult: Secondary | ICD-10-CM | POA: Diagnosis not present

## 2018-03-24 DIAGNOSIS — Z113 Encounter for screening for infections with a predominantly sexual mode of transmission: Secondary | ICD-10-CM | POA: Diagnosis not present

## 2018-03-24 DIAGNOSIS — Z01419 Encounter for gynecological examination (general) (routine) without abnormal findings: Secondary | ICD-10-CM | POA: Diagnosis not present

## 2018-04-01 DIAGNOSIS — R911 Solitary pulmonary nodule: Secondary | ICD-10-CM | POA: Diagnosis not present

## 2018-04-01 DIAGNOSIS — I10 Essential (primary) hypertension: Secondary | ICD-10-CM | POA: Diagnosis not present

## 2018-04-01 DIAGNOSIS — Z720 Tobacco use: Secondary | ICD-10-CM | POA: Diagnosis not present

## 2018-04-01 DIAGNOSIS — E0590 Thyrotoxicosis, unspecified without thyrotoxic crisis or storm: Secondary | ICD-10-CM | POA: Diagnosis not present

## 2018-04-02 DIAGNOSIS — Z3202 Encounter for pregnancy test, result negative: Secondary | ICD-10-CM | POA: Diagnosis not present

## 2018-04-02 DIAGNOSIS — N870 Mild cervical dysplasia: Secondary | ICD-10-CM | POA: Diagnosis not present

## 2018-04-02 DIAGNOSIS — R87810 Cervical high risk human papillomavirus (HPV) DNA test positive: Secondary | ICD-10-CM | POA: Diagnosis not present

## 2018-04-02 DIAGNOSIS — R87618 Other abnormal cytological findings on specimens from cervix uteri: Secondary | ICD-10-CM | POA: Diagnosis not present

## 2018-04-07 ENCOUNTER — Other Ambulatory Visit: Payer: Self-pay | Admitting: Orthopedic Surgery

## 2018-04-07 ENCOUNTER — Other Ambulatory Visit: Payer: Self-pay | Admitting: Family Medicine

## 2018-04-07 DIAGNOSIS — M545 Low back pain, unspecified: Secondary | ICD-10-CM

## 2018-04-07 DIAGNOSIS — R911 Solitary pulmonary nodule: Secondary | ICD-10-CM

## 2018-04-11 ENCOUNTER — Ambulatory Visit
Admission: RE | Admit: 2018-04-11 | Discharge: 2018-04-11 | Disposition: A | Discharge status: Home / Self Care | Payer: BLUE CROSS/BLUE SHIELD | Source: Ambulatory Visit | Attending: Family Medicine | Admitting: Family Medicine

## 2018-04-11 DIAGNOSIS — R911 Solitary pulmonary nodule: Secondary | ICD-10-CM

## 2018-04-14 DIAGNOSIS — N870 Mild cervical dysplasia: Secondary | ICD-10-CM | POA: Diagnosis not present

## 2018-04-14 DIAGNOSIS — N979 Female infertility, unspecified: Secondary | ICD-10-CM | POA: Diagnosis not present

## 2018-04-14 DIAGNOSIS — R87810 Cervical high risk human papillomavirus (HPV) DNA test positive: Secondary | ICD-10-CM | POA: Diagnosis not present

## 2018-04-14 DIAGNOSIS — I10 Essential (primary) hypertension: Secondary | ICD-10-CM | POA: Diagnosis not present

## 2018-04-20 ENCOUNTER — Other Ambulatory Visit: Payer: BLUE CROSS/BLUE SHIELD

## 2018-04-26 ENCOUNTER — Ambulatory Visit
Admission: RE | Admit: 2018-04-26 | Discharge: 2018-04-26 | Disposition: A | Discharge status: Home / Self Care | Payer: BLUE CROSS/BLUE SHIELD | Source: Ambulatory Visit | Attending: Orthopedic Surgery | Admitting: Orthopedic Surgery

## 2018-04-26 DIAGNOSIS — M4726 Other spondylosis with radiculopathy, lumbar region: Secondary | ICD-10-CM | POA: Diagnosis not present

## 2018-04-26 DIAGNOSIS — M5116 Intervertebral disc disorders with radiculopathy, lumbar region: Secondary | ICD-10-CM | POA: Diagnosis not present

## 2018-04-26 DIAGNOSIS — M545 Low back pain, unspecified: Secondary | ICD-10-CM

## 2018-04-29 DIAGNOSIS — M545 Low back pain: Secondary | ICD-10-CM | POA: Diagnosis not present

## 2018-05-02 DIAGNOSIS — M62838 Other muscle spasm: Secondary | ICD-10-CM | POA: Diagnosis not present

## 2018-05-02 DIAGNOSIS — M6289 Other specified disorders of muscle: Secondary | ICD-10-CM | POA: Diagnosis not present

## 2018-05-02 DIAGNOSIS — M62830 Muscle spasm of back: Secondary | ICD-10-CM | POA: Diagnosis not present

## 2018-05-08 DIAGNOSIS — M62838 Other muscle spasm: Secondary | ICD-10-CM | POA: Diagnosis not present

## 2018-05-08 DIAGNOSIS — M62830 Muscle spasm of back: Secondary | ICD-10-CM | POA: Diagnosis not present

## 2018-05-08 DIAGNOSIS — M6289 Other specified disorders of muscle: Secondary | ICD-10-CM | POA: Diagnosis not present

## 2018-05-14 DIAGNOSIS — M6289 Other specified disorders of muscle: Secondary | ICD-10-CM | POA: Diagnosis not present

## 2018-05-14 DIAGNOSIS — M62830 Muscle spasm of back: Secondary | ICD-10-CM | POA: Diagnosis not present

## 2018-05-14 DIAGNOSIS — M62838 Other muscle spasm: Secondary | ICD-10-CM | POA: Diagnosis not present

## 2018-05-22 ENCOUNTER — Encounter (HOSPITAL_BASED_OUTPATIENT_CLINIC_OR_DEPARTMENT_OTHER): Payer: Self-pay | Admitting: *Deleted

## 2018-05-22 ENCOUNTER — Emergency Department (HOSPITAL_BASED_OUTPATIENT_CLINIC_OR_DEPARTMENT_OTHER)
Admission: EM | Admit: 2018-05-22 | Discharge: 2018-05-22 | Disposition: A | Discharge status: Home / Self Care | Payer: BLUE CROSS/BLUE SHIELD | Attending: Emergency Medicine | Admitting: Emergency Medicine

## 2018-05-22 DIAGNOSIS — J45909 Unspecified asthma, uncomplicated: Secondary | ICD-10-CM | POA: Diagnosis not present

## 2018-05-22 DIAGNOSIS — Z79899 Other long term (current) drug therapy: Secondary | ICD-10-CM | POA: Insufficient documentation

## 2018-05-22 DIAGNOSIS — R69 Illness, unspecified: Secondary | ICD-10-CM

## 2018-05-22 DIAGNOSIS — F17290 Nicotine dependence, other tobacco product, uncomplicated: Secondary | ICD-10-CM | POA: Diagnosis not present

## 2018-05-22 DIAGNOSIS — J111 Influenza due to unidentified influenza virus with other respiratory manifestations: Secondary | ICD-10-CM | POA: Diagnosis not present

## 2018-05-22 DIAGNOSIS — R05 Cough: Secondary | ICD-10-CM | POA: Diagnosis not present

## 2018-05-22 MED ORDER — BENZONATATE 100 MG PO CAPS: 100.0000 mg | ORAL_CAPSULE | Freq: Three times a day (TID) | ORAL | 0 refills | Status: DC

## 2018-05-22 MED ORDER — FLUTICASONE PROPIONATE 50 MCG/ACT NA SUSP: 1.0000 | Freq: Every day | NASAL | 2 refills | Status: DC

## 2018-05-22 NOTE — ED Provider Notes (Signed)
[facility] EMERGENCY DEPARTMENT Provider Note   CSN: [id number] Arrival date & time: 05/22/18  1809     History   Chief Complaint Chief Complaint  Patient presents with  . URI    HPI Anne [name] is a 38 y.o. female past medical history of thyrotoxicosis, mitral regurg who presents for evaluation of 3 days of cough, congestion, generalized body aches.  She reports that cough is productive of green phlegm.  No hemoptysis.  Patient states that she also developed some sore throat that began yesterday.  She has been able to tolerate secretions and p.o.  She reports she has been taking TheraFlu with minimal improvement.  She has not tried any other medications.  Patient reports that she "just feels achy all over."  He does report some nausea but denies any vomiting.  She reports that her boyfriend has been at home with similar symptoms.  She did not get a flu shot this year.  She reports history of asthma as a kid.  She does not currently use an inhaler.  Patient denies any fevers, chest pain, vomiting.  Patient reports that she smokes Black and mild and reports approximately 4 black and miles a day.  The history is provided by the patient.    Past Medical History:  Diagnosis Date  . Abnormal echocardiogram    a. 11/2016 - increased LVOT gradient.  . Asthma   . Mild mitral regurgitation   . Normocytic anemia   . Obesity   . Thyrotoxicosis 11/2014  . Tobacco abuse   . Tricuspid regurgitation     Patient Active Problem List   Diagnosis Date Noted  . Thyrotoxicosis 12/14/2016  . Murmur, heart 08/10/2014  . Blood pressure elevated without history of HTN 08/10/2014    Past Surgical History:  Procedure Laterality Date  . ECTOPIC PREGNANCY SURGERY    . MANDIBLE SURGERY    . TUBAL LIGATION       OB History    Gravida  1   Para      Term      Preterm      AB      Living        SAB      TAB      Ectopic      Multiple      Live Births               Home Medications    Prior to Admission medications   Medication Sig Start Date End Date Taking? Authorizing Provider  benzonatate (TESSALON) 100 MG capsule Take 1 capsule (100 mg total) by mouth every 8 (eight) hours. 05/22/18   Maxwell Caul, PA-C  butalbital-acetaminophen-caffeine (FIORICET, ESGIC) 50-325-40 MG tablet Take 1 tablet by mouth 2 (two) times daily as needed for migraine. 12/26/16   Lizbeth Bark, FNP  cyclobenzaprine (FLEXERIL) 5 MG tablet Take 5 mg by mouth at bedtime. 12/26/16   [provider]  fluticasone (FLONASE) 50 MCG/ACT nasal spray Place 1 spray into both nostrils daily. 05/22/18   Maxwell Caul, PA-C  HYDROcodone-acetaminophen (NORCO) 7.5-325 MG tablet Take 1 tablet by mouth every 6 (six) hours as needed for painful procedure/intervention. 12/06/16   [provider]  ibuprofen (ADVIL,MOTRIN) 600 MG tablet TAKE 1 TABLET BY MOUTH EVERY 8 HOURS AS NEEDED FOR HEADACHE. TAKE WITH FOOD 01/23/17   Lizbeth Bark, FNP  methimazole (TAPAZOLE) 10 MG tablet Take 6 tablets (60 mg total) by mouth daily. 12/17/16  Zannie CoveJoseph, Preetha, MD  propranolol (INDERAL) 80 MG tablet Take 80 mg by mouth 2 (two) times daily. 01/04/17   [provider]    Family History Family History  Problem Relation Age of Onset  . Hypertension Father   . Thyroid disease Mother   . Diabetes Maternal Grandmother   . Thyroid disease Maternal Grandmother   . Diabetes Maternal Aunt   . Diabetes Maternal Aunt   . Kidney disease Maternal Aunt     Social History Social History   Tobacco Use  . Smoking status: Current Every Day Smoker    Packs/day: 1.00    Years: 10.00    Pack years: 10.00    Types: Cigarettes, Cigars  . Smokeless tobacco: Never Used  Substance Use Topics  . Alcohol use: Yes    Alcohol/week: 1.0 standard drinks    Types: 1 Glasses of wine per week    Comment: occasion - 1 glass of wine per week  . Drug use: No     Allergies   Food  allergy formula and Shellfish allergy   Review of Systems Review of Systems  Constitutional: Negative for fever.  HENT: Positive for congestion and sore throat. Negative for drooling and trouble swallowing.   Respiratory: Positive for cough. Negative for shortness of breath.   Cardiovascular: Negative for chest pain.  Gastrointestinal: Negative for abdominal pain, nausea and vomiting.  Musculoskeletal: Positive for myalgias.  Neurological: Negative for headaches.  All other systems reviewed and are negative.    Physical Exam Updated Vital Signs BP (!) 150/107   Pulse 86   Temp 98 F (36.7 C)   Ht 5\' 10"  (1.778 m)   Wt 112.5 kg   LMP 04/22/2018   SpO2 100%   BMI 35.58 kg/m   Physical Exam Vitals signs and nursing note reviewed.  Constitutional:      Appearance: Normal appearance. She is well-developed.  HENT:     Head: Normocephalic and atraumatic.     Nose: Congestion present.     Comments: Edematous and erythematous nasal turbinates bilaterally.    Mouth/Throat:     Mouth: Mucous membranes are moist.     Pharynx: Posterior oropharyngeal erythema present.     Comments: Posterior oropharynx is slightly erythematous.  No exudates, edema.  Uvula is midline. Airway is patent, phonation is intact. Eyes:     General: Lids are normal.     Conjunctiva/sclera: Conjunctivae normal.     Pupils: Pupils are equal, round, and reactive to light.  Neck:     Musculoskeletal: Full passive range of motion without pain.  Cardiovascular:     Rate and Rhythm: Normal rate and regular rhythm.     Pulses: Normal pulses.     Heart sounds: Normal heart sounds. No murmur. No friction rub. No gallop.   Pulmonary:     Effort: Pulmonary effort is normal.     Breath sounds: Normal breath sounds.     Comments: Lungs clear to auscultation bilaterally.  Symmetric chest rise.  No wheezing, rales, rhonchi. Musculoskeletal: Normal range of motion.  Skin:    General: Skin is warm and dry.      Capillary Refill: Capillary refill takes less than 2 seconds.  Neurological:     Mental Status: She is alert and oriented to person, place, and time.  Psychiatric:        Speech: Speech normal.      ED Treatments / Results  Labs (all labs ordered are listed, but only abnormal results  are displayed) Labs Reviewed - No data to display  EKG None  Radiology No results found.  Procedures Procedures (including critical care time)  Medications Ordered in ED Medications - No data to display   Initial Impression / Assessment and Plan / ED Course  I have reviewed the triage vital signs and the nursing notes.  Pertinent labs & imaging results that were available during my care of the patient were reviewed by me and considered in my medical decision making (see chart for details).     38 year old female who presents for evaluation of 3 days of cough, congestion, generalized body aches as well as sore throat. Patient is afebrile, non-toxic appearing, sitting comfortably on examination table. Vital signs reviewed and stable.  Patient reports boyfriend is at home with similar symptoms.  Her influenza.  History/physical exam not concerning for pneumonia, Ludwig's angina, peritonsillar abscess.  Given that patient is greater than 48 hours of symptom onset, she is not eligible for Tamiflu.  Discussed with patient and she is agreeable.  Encourage at home supportive care measures. At this time, patient exhibits no emergent life-threatening condition that require further evaluation in ED or admission. Patient had ample opportunity for questions and discussion. All patient's questions were answered with full understanding. Strict return precautions discussed. Patient expresses understanding and agreement to plan.   Portions of this note were generated with Scientist, clinical (histocompatibility and immunogenetics). Dictation errors may occur despite best attempts at proofreading.  Final Clinical Impressions(s) / ED Diagnoses    Final diagnoses:  Influenza-like illness    ED Discharge Orders         Ordered    benzonatate (TESSALON) 100 MG capsule  Every 8 hours     05/22/18 1932    fluticasone (FLONASE) 50 MCG/ACT nasal spray  Daily     05/22/18 1932           Rosana Hoes 05/22/18 1938    Charlynne Pander, MD 05/22/18 [phone]

## 2018-05-22 NOTE — Discharge Instructions (Signed)
You can take Tylenol or Ibuprofen as directed for pain. You can alternate Tylenol and Ibuprofen every 4 hours. If you take Tylenol at 1pm, then you can take Ibuprofen at 5pm. Then you can take Tylenol again at 9pm.   Use Flonase for congestion.   Use Tessalon perles for cough.   Make sure you are getting plenty of rest and drinking plenty of fluids.   Return to emergency department for any fevers, difficulty breathing, vomiting, any worsening or concerning symptoms.

## 2018-05-22 NOTE — ED Triage Notes (Signed)
Pt c/o URi symptoms x 3 days  

## 2018-05-23 ENCOUNTER — Telehealth: Payer: Self-pay | Admitting: *Deleted

## 2018-05-23 NOTE — Telephone Encounter (Signed)
Pt calle regarding Rx for Flu.  EDCM reviewed chart to find that pt wasn't dx with flu and was given Rx for cough.  Pt states she received that but thought she was missing Rx for flu (Tamilflu). EDCM assured her she received Rx to match her dx.  No further EDCM needs identified at this time.

## 2018-06-02 DIAGNOSIS — M545 Low back pain: Secondary | ICD-10-CM | POA: Diagnosis not present

## 2018-10-17 DIAGNOSIS — I10 Essential (primary) hypertension: Secondary | ICD-10-CM | POA: Diagnosis not present

## 2018-10-17 DIAGNOSIS — M5412 Radiculopathy, cervical region: Secondary | ICD-10-CM | POA: Diagnosis not present

## 2018-10-17 DIAGNOSIS — R911 Solitary pulmonary nodule: Secondary | ICD-10-CM | POA: Diagnosis not present

## 2018-10-17 DIAGNOSIS — E0590 Thyrotoxicosis, unspecified without thyrotoxic crisis or storm: Secondary | ICD-10-CM | POA: Diagnosis not present

## 2018-10-22 DIAGNOSIS — E0500 Thyrotoxicosis with diffuse goiter without thyrotoxic crisis or storm: Secondary | ICD-10-CM | POA: Diagnosis not present

## 2018-10-22 DIAGNOSIS — R000 Tachycardia, unspecified: Secondary | ICD-10-CM | POA: Diagnosis not present

## 2018-10-22 DIAGNOSIS — R1012 Left upper quadrant pain: Secondary | ICD-10-CM | POA: Diagnosis not present

## 2018-10-22 DIAGNOSIS — E049 Nontoxic goiter, unspecified: Secondary | ICD-10-CM | POA: Diagnosis not present

## 2018-10-22 DIAGNOSIS — Z5181 Encounter for therapeutic drug level monitoring: Secondary | ICD-10-CM | POA: Diagnosis not present

## 2018-10-22 DIAGNOSIS — E0590 Thyrotoxicosis, unspecified without thyrotoxic crisis or storm: Secondary | ICD-10-CM | POA: Diagnosis not present

## 2018-10-23 DIAGNOSIS — Z6839 Body mass index (BMI) 39.0-39.9, adult: Secondary | ICD-10-CM | POA: Diagnosis not present

## 2018-10-23 DIAGNOSIS — R87618 Other abnormal cytological findings on specimens from cervix uteri: Secondary | ICD-10-CM | POA: Diagnosis not present

## 2018-10-23 DIAGNOSIS — Z124 Encounter for screening for malignant neoplasm of cervix: Secondary | ICD-10-CM | POA: Diagnosis not present

## 2018-10-23 DIAGNOSIS — Z113 Encounter for screening for infections with a predominantly sexual mode of transmission: Secondary | ICD-10-CM | POA: Diagnosis not present

## 2018-10-23 DIAGNOSIS — Z01419 Encounter for gynecological examination (general) (routine) without abnormal findings: Secondary | ICD-10-CM | POA: Diagnosis not present

## 2018-11-05 DIAGNOSIS — M5412 Radiculopathy, cervical region: Secondary | ICD-10-CM | POA: Diagnosis not present

## 2018-11-13 ENCOUNTER — Other Ambulatory Visit: Payer: Self-pay

## 2018-11-13 ENCOUNTER — Encounter: Payer: Self-pay | Admitting: Physical Therapy

## 2018-11-13 ENCOUNTER — Ambulatory Visit: Payer: BC Managed Care – PPO | Attending: Family Medicine | Admitting: Physical Therapy

## 2018-11-13 DIAGNOSIS — M6281 Muscle weakness (generalized): Secondary | ICD-10-CM | POA: Diagnosis not present

## 2018-11-13 DIAGNOSIS — R600 Localized edema: Secondary | ICD-10-CM | POA: Diagnosis not present

## 2018-11-13 DIAGNOSIS — M542 Cervicalgia: Secondary | ICD-10-CM

## 2018-11-13 DIAGNOSIS — M5412 Radiculopathy, cervical region: Secondary | ICD-10-CM | POA: Diagnosis not present

## 2018-11-13 NOTE — Therapy (Signed)
[facility] [address] [place], [place], [address] Phone: [phone]   Fax:  [phone]  Physical Therapy Treatment  Patient Details  Name: Anne [name] MRN: [id number] Date of Birth: Jun 24, 1980 Referring Provider (PT): Dr. Peterson AoKoriala Dibas   Encounter Date: 11/13/2018  PT End of Session - 11/13/18 1228    Visit Number  1    Number of Visits  16    Date for PT Re-Evaluation  01/08/19    PT Start Time  1145    PT Stop Time  1235    PT Time Calculation (min)  50 min    Activity Tolerance  Patient tolerated treatment well;Patient limited by pain    Behavior During Therapy  [facility] for tasks assessed/performed       Past Medical History:  Diagnosis Date  . Abnormal echocardiogram    a. 11/2016 - increased LVOT gradient.  . Asthma   . Mild mitral regurgitation   . Normocytic anemia   . Obesity   . Thyrotoxicosis 11/2014  . Tobacco abuse   . Tricuspid regurgitation     Past Surgical History:  Procedure Laterality Date  . ECTOPIC PREGNANCY SURGERY    . MANDIBLE SURGERY    . TUBAL LIGATION      There were no vitals filed for this visit.  Subjective Assessment - 11/13/18 1149    Subjective  Pt presents with neck pain and Rt arm pain.  She has had neck pain for about 4 mos, persistent and now extending into  Rt hand and arm symptoms 1 month.  She is R handed.  It is difficult for her lift with her arms and stack heavy items on the pallet at worked (30 lbs)   The numbness in her hand is constant.  Hand can cramp and swell.    Pertinent History  Grave's disease , low back pain, HTN    Limitations  Sitting;Lifting;Standing;Writing;House hold activities    Diagnostic tests  none yet, MRI if needed    Patient Stated Goals  Pt would like normal use of her hand.    Currently in Pain?  Yes    Pain Score  7     Pain Location  Neck    Pain Orientation  Right;Left;Upper;Mid;Lower    Pain Descriptors / Indicators   Aching;Tightness;Sore;Other (Comment);Burning   ripping   Pain Type  Chronic pain    Pain Radiating Towards  R hand and arm    Pain Onset  More than a month ago    Pain Frequency  Constant    Aggravating Factors   lifting    Pain Relieving Factors  takes Ibuprofen, does not use heat or ice, boyfriend massage         [facility] - 11/13/18 0001      Assessment   Medical Diagnosis  cervical radiculopathy    Referring Provider (PT)  Dr. Peterson AoKoriala Dibas    Onset Date/Surgical Date  --   4 mos    Hand Dominance  Right    Prior Therapy  no       Precautions   Precautions  None      Restrictions   Weight Bearing Restrictions  No      Balance Screen   Has the patient fallen in the past 6 months  No    Has the patient had a decrease in activity level because of a fear of falling?   No    Is the patient  reluctant to leave their home because of a fear of falling?   No      Home Film/video editor residence    Living Arrangements  Spouse/significant other    Type of [place]      Prior Function   Level of Independence  Independent    Vocation  Full time employment    Vocation Requirements  [employer]     Leisure  kind of sedentary, reading , books       Cognition   Overall Cognitive Status  Within Functional Limits for tasks assessed      Observation/Other Assessments-Edema    Edema  --   Rt arm and hand swelling off and on      Sensation   Light Touch  Impaired by gross assessment    Additional Comments  middle finger and 4th digit starting to be numb       Posture/Postural Control   Posture/Postural Control  Postural limitations    Postural Limitations  Rounded Shoulders;Forward head      AROM   Right Shoulder Flexion  120 Degrees    Left Shoulder Flexion  150 Degrees    Cervical Flexion  33   pain    Cervical Extension  35   pain    Cervical - Right Side Bend  40   pain    Cervical - Left Side Bend  35    pain on R    Cervical - Right Rotation  50    pain    Cervical - Left Rotation  70    pain on R      PROM   Overall PROM Comments  pain in Rt shoulder, arm with flexion, abduction , cervical not tolerated       Strength   Right Shoulder Flexion  3+/5    Left Shoulder Flexion  4/5    Right Elbow Flexion  4/5    Left Elbow Flexion  4+/5      Palpation   Palpation comment  not well tolerated , hypertonic throughout, guarded      Special Tests   Other special tests  radicular pain with set up for [place], did not tolerate manual traction , pain in R UE and center of back             PT Education - 11/13/18 1227    Education Details  PT/POC, HEP, nerve pain, IFC, ice for inflammation, avoid lifting    Person(s) Educated  Patient    Methods  Explanation    Comprehension  Verbalized understanding;Need further instruction       PT Short Term Goals - 11/13/18 1316      PT SHORT TERM GOAL #1   Title  Pt will report reduced symptoms in Rt UE and hand by 25%    Time  4    Period  Weeks    Status  New    Target Date  12/11/18      PT SHORT TERM GOAL #2   Title  Pt will be able to improve comfort at rest to minimal, less than 4/10, 50% of the time    Time  4    Period  Weeks    Status  New    Target Date  12/11/18      PT SHORT TERM GOAL #3   Title  Pt will be able to use ice, HEP, positioning at home to control  pain    Time  4    Period  Weeks    Status  New    Target Date  12/11/18        PT Long Term Goals - 11/13/18 1319      PT LONG TERM GOAL #1   Title  Pt will be able to decrease FOTO limitation to no more than 27% limited    Time  8    Period  Weeks    Status  New    Target Date  01/08/19      PT LONG TERM GOAL #2   Title  Pt will be I with more advanced HEP for stabilization and ROM of cervical spine    Time  8    Period  Weeks    Status  New    Target Date  01/08/19      PT LONG TERM GOAL #3   Title  Pt will be able to lift with proper  body mechanics and min increase in neck pain    Time  8    Period  Weeks    Status  New    Target Date  01/08/19      PT LONG TERM GOAL #4   Title  Pt will report min occasional pain in Rt UE (centralization of pain )    Time  8    Period  Weeks    Status  New    Target Date  01/08/19            Plan - 11/13/18 1323    Clinical Impression Statement  Patient presents for mod complexity eval of cervical radiculopathy likely due to repetitive lifting and stress injury.  She was very flared up today and did not tolerate manual palpation or provocation testing.  Utilized IFC and ice to calm nervous system.  She did fell a bit better.  She is supposed to return to work Sunday but I urged her to see if she could stay out of work a bit longer. Repetitive lifting would definitely not be prudent at this time.    Personal Factors and Comorbidities  Comorbidity 1;Comorbidity 2;Fitness    Comorbidities  Graves disease with hyperthyroidism, HTN    Examination-Activity Limitations  Bed Mobility;Lift;Sleep;Carry;Sit;Reach Overhead    Examination-Participation Restrictions  Cleaning;Other   work   Conservation officer, historic buildingstability/Clinical Decision Making  Evolving/Moderate complexity    Clinical Decision Making  Moderate    Rehab Potential  Good    PT Frequency  2x / week    PT Duration  8 weeks    PT Treatment/Interventions  ADLs/Self Care Home Management;Electrical Stimulation;Cryotherapy;Moist Heat;Traction;Ultrasound;Therapeutic exercise;Neuromuscular re-education;Patient/family education;Manual techniques;Passive range of motion;Taping;Dry needling;Functional mobility training    PT Next Visit Plan  modalities, gentle stab in supine, manual/STW    PT Home Exercise Plan  chin tuck, supine cervical rotation, scapular retraction    Consulted and Agree with Plan of Care  Patient       Patient will benefit from skilled therapeutic intervention in order to improve the following deficits and impairments:  Decreased  activity tolerance, Decreased mobility, Increased edema, Increased fascial restricitons, Impaired UE functional use, Impaired sensation, Improper body mechanics, Postural dysfunction, Impaired flexibility, Decreased range of motion, Pain  Visit Diagnosis: 1. Cervicalgia   2. Muscle weakness (generalized)   3. Localized edema   4. Radiculopathy, cervical region        Problem List Patient Active Problem List   Diagnosis Date Noted  . Thyrotoxicosis 12/14/2016  .  Murmur, heart 08/10/2014  . Blood pressure elevated without history of HTN 08/10/2014    , 11/13/2018, 1:36 PM  [facility] [address] [place], [place], [address] Phone: [phone]   Fax:  [phone]  Name: Anne [name] MRN: [id number] Date of Birth: 20-Jul-1980  Karie MainlandJennifer , PT 11/13/18 1:36 PM Phone: [phone] Fax: [phone]

## 2018-11-13 NOTE — Patient Instructions (Signed)
Head Press With [place] chin SLIGHTLY toward chest, keep mouth closed. Feel weight on back of head. Increase weight by pressing head down. Hold _5__ seconds. Relax. Repeat ___10  times. Surface: floor   Copyright  VHI. All rights reserved.    Upper Cervical Rotation    Rotate head slowly from side to side as if saying "no". Do not turn head completely to either side. Keep motion small. Repeat __10__ times per set. Do ___1-2_ sets per session. Do ___1-2_ sessions per day.  http://orth.exer.us/375   Copyright  VHI. All rights reserved.   Scapular Retraction (Standing)    With arms at sides, pinch shoulder blades together. Repeat __10__ times per set. Do __2__ sets per session. Do __2__ sessions per day.  http://orth.exer.us/945   Copyright  VHI. All rights reserved.

## 2018-11-25 ENCOUNTER — Ambulatory Visit: Payer: BC Managed Care – PPO | Attending: Family Medicine | Admitting: Physical Therapy

## 2018-11-25 ENCOUNTER — Encounter: Payer: Self-pay | Admitting: Physical Therapy

## 2018-11-25 ENCOUNTER — Other Ambulatory Visit: Payer: Self-pay

## 2018-11-25 DIAGNOSIS — M542 Cervicalgia: Secondary | ICD-10-CM | POA: Insufficient documentation

## 2018-11-25 DIAGNOSIS — M6281 Muscle weakness (generalized): Secondary | ICD-10-CM | POA: Insufficient documentation

## 2018-11-25 DIAGNOSIS — M5412 Radiculopathy, cervical region: Secondary | ICD-10-CM

## 2018-11-25 DIAGNOSIS — R600 Localized edema: Secondary | ICD-10-CM | POA: Diagnosis not present

## 2018-11-25 NOTE — Therapy (Signed)
[place], [place], [address] Phone: [phone]   Fax:  [phone]  Physical Therapy Treatment  Patient Details  Name: Anne [name] MRN: [id number] Date of Birth: November 07, 1980 Referring Provider (PT): Dr. Tonny Bollman Dibas   Encounter Date: 11/25/2018  PT End of Session - 11/25/18 1152    Visit Number  2    Number of Visits  16    Date for PT Re-Evaluation  01/08/19    PT Start Time  1324    PT Stop Time  1246    PT Time Calculation (min)  61 min       Past Medical History:  Diagnosis Date  . Abnormal echocardiogram    a. 11/2016 - increased LVOT gradient.  . Asthma   . Mild mitral regurgitation   . Normocytic anemia   . Obesity   . Thyrotoxicosis 11/2014  . Tobacco abuse   . Tricuspid regurgitation     Past Surgical History:  Procedure Laterality Date  . ECTOPIC PREGNANCY SURGERY    . MANDIBLE SURGERY    . TUBAL LIGATION      There were no vitals filed for this visit.  Subjective Assessment - 11/25/18 1151    Subjective  Aching across top of shoulders.    Currently in Pain?  Yes    Pain Score  8     Pain Location  Neck    Pain Orientation  Right;Left;Lower;Mid    Pain Descriptors / Indicators  Aching;Burning    Pain Type  Chronic pain    Aggravating Factors   lifting    Pain Relieving Factors  massage, obuprofen                       [facility] Adult PT Treatment/Exercise - 11/25/18 0001      Neck Exercises: Standing   Other Standing Exercises  cervical retract x 10, scap saqueeze x10, shoulder rolls x 10       Neck Exercises: Seated   Other Seated Exercise  4 way neck AROM x 4 each way - comfortable ROM       Neck Exercises: Supine   Neck Retraction  10 reps    Neck Retraction Limitations  small ROM    Other Supine Exercise  attempted cane press ups, unable to tolerate lowering due to right shoulder pain.     Other Supine Exercise  gentle cervical AROM rotation ,  per HEP      Modalities   Modalities  Moist Heat      Moist Heat Therapy   Number Minutes Moist Heat  15 Minutes    Moist Heat Location  Cervical                       Electrical Stimulation   Electrical Stimulation Location  upper traps, thoracic    Electrical Stimulation Action  IFC x 15 min    Electrical Stimulation Parameters  15    Electrical Stimulation Goals  Pain      Manual Therapy   Manual Therapy  Soft tissue mobilization    Soft tissue mobilization  Seated manaul soft tissue performed to bilateral upper traps , peri scap-light pressure to might/ moderate pressure                PT Short Term Goals - 11/13/18 1316      PT SHORT TERM GOAL #1   Title  Pt will  report reduced symptoms in Rt UE and hand by 25%    Time  4    Period  Weeks    Status  New    Target Date  12/11/18      PT SHORT TERM GOAL #2   Title  Pt will be able to improve comfort at rest to minimal, less than 4/10, 50% of the time    Time  4    Period  Weeks    Status  New    Target Date  12/11/18      PT SHORT TERM GOAL #3   Title  Pt will be able to use ice, HEP, positioning at home to control pain    Time  4    Period  Weeks    Status  New    Target Date  12/11/18        PT Long Term Goals - 11/13/18 1319      PT LONG TERM GOAL #1   Title  Pt will be able to decrease FOTO limitation to no more than 27% limited    Time  8    Period  Weeks    Status  New    Target Date  01/08/19      PT LONG TERM GOAL #2   Title  Pt will be I with more advanced HEP for stabilization and ROM of cervical spine    Time  8    Period  Weeks    Status  New    Target Date  01/08/19      PT LONG TERM GOAL #3   Title  Pt will be able to lift with proper body mechanics and min increase in neck pain    Time  8    Period  Weeks    Status  New    Target Date  01/08/19      PT LONG TERM GOAL #4   Title  Pt will report min occasional pain in Rt UE (centralization of pain )    Time  8     Period  Weeks    Status  New    Target Date  01/08/19            Plan - 11/25/18 1240    Clinical Impression Statement  Pt reports higher level of pain. She reports IFC/Ice last session was "okay." Reviewed gentle postural exercises in standing with pain at first. After several reps , pain decreased. Ultrasound used prior to manual soft tissue work. After US, she reported pain decreased. Began light pressure working progressively deeper to soften musclulature of peri cervical and scapular areas. Began gentle cervical AROM in all planes. Some increased pain reported after AROM in right trap and shoulder. This decreased after standing and performing shoulder rolls and raising her right arm easily to 90 degrees.   Attempted supine cane pressups and she was unable to tolerate due to right shoulder pain. Ended with HMP and IFC. At end of session pain reported at 6/10.    PT Next Visit Plan  modalities, gentle stab in supine, manual/STW; try pulleys?, try PROM right shoulder? Repeat US    PT Home Exercise Plan  chin tuck, supine cervical rotation, scapular retraction       Patient will benefit from skilled therapeutic intervention in order to improve the following deficits and impairments:  Decreased activity tolerance, Decreased mobility, Increased edema, Increased fascial restricitons, Impaired UE functional use, Impaired sensation, Improper body mechanics, Postural dysfunction,  Impaired flexibility, Decreased range of motion, Pain  Visit Diagnosis: 1. Cervicalgia   2. Muscle weakness (generalized)   3. Localized edema   4. Radiculopathy, cervical region        Problem List Patient Active Problem List   Diagnosis Date Noted  . Thyrotoxicosis 12/14/2016  . Murmur, heart 08/10/2014  . Blood pressure elevated without history of HTN 08/10/2014    Sherrie Mustacheonoho, Elian Gloster McGee, PTA 11/25/2018, 1:01 PM  [facility] [address] [place], [place], [address] Phone: [phone]   Fax:  [phone]  Name: Anne [name] MRN: [id number] Date of Birth: Dec 15, 1980

## 2018-11-27 ENCOUNTER — Encounter: Payer: Self-pay | Admitting: Physical Therapy

## 2018-11-27 ENCOUNTER — Ambulatory Visit: Payer: BC Managed Care – PPO | Admitting: Physical Therapy

## 2018-11-27 ENCOUNTER — Other Ambulatory Visit: Payer: Self-pay

## 2018-11-27 DIAGNOSIS — M5412 Radiculopathy, cervical region: Secondary | ICD-10-CM | POA: Diagnosis not present

## 2018-11-27 DIAGNOSIS — M6281 Muscle weakness (generalized): Secondary | ICD-10-CM | POA: Diagnosis not present

## 2018-11-27 DIAGNOSIS — M542 Cervicalgia: Secondary | ICD-10-CM

## 2018-11-27 DIAGNOSIS — R600 Localized edema: Secondary | ICD-10-CM | POA: Diagnosis not present

## 2018-11-28 ENCOUNTER — Encounter: Payer: Self-pay | Admitting: Physical Therapy

## 2018-11-28 DIAGNOSIS — N979 Female infertility, unspecified: Secondary | ICD-10-CM | POA: Diagnosis not present

## 2018-11-28 NOTE — Therapy (Signed)
[facility] [address] [place], [place], [address] Phone: [phone]   Fax:  [phone]  Physical Therapy Treatment  Patient Details  Name: Anne [name] MRN: [id number] Date of Birth: 1981-02-03 Referring Provider (PT): Dr. Peterson AoKoriala Dibas   Encounter Date: 11/27/2018  PT End of Session - 11/28/18 1058    Visit Number  3    Number of Visits  16    Date for PT Re-Evaluation  01/08/19    PT Start Time  1145    PT Stop Time  1240    PT Time Calculation (min)  55 min    Activity Tolerance  Patient tolerated treatment well;Patient limited by pain    Behavior During Therapy  [facility]/per[facility]       Past Medical History:  Diagnosis Date  . Abnormal echocardiogram    a. 11/2016 - increased LVOT gradient.  . Asthma   . Mild mitral regurgitation   . Normocytic anemia   . Obesity   . Thyrotoxicosis 11/2014  . Tobacco abuse   . Tricuspid regurgitation     Past Surgical History:  Procedure Laterality Date  . ECTOPIC PREGNANCY SURGERY    . MANDIBLE SURGERY    . TUBAL LIGATION      There were no vitals filed for this visit.  Subjective Assessment - 11/27/18 1203    Subjective  Patient continues to report high levels of pain and pain radiating down into her right finger. She has been trying to do some of her exercises.    Pertinent History  Grave's disease , low back pain, HTN    Limitations  Sitting;Lifting;Standing;Writing;House hold activities    Diagnostic tests  none yet, MRI if needed    Patient Stated Goals  Pt would like normal use of her hand.    Currently in Pain?  Yes    Pain Score  8     Pain Location  Neck    Pain Orientation  Right;Left;Lower    Pain Descriptors / Indicators  Aching;Burning    Pain Type  Chronic pain    Pain Radiating Towards  into right hand and pinkie finger    Pain Onset  More than a month ago    Pain Frequency  Constant    Aggravating Factors   lifting    Pain  Relieving Factors  massage    Effect of Pain on Daily Activities  difficulty perfroming ADL's                       [facility] Adult PT Treatment/Exercise - 11/28/18 0001      Self-Care   Other Self-Care Comments   reviewed relaxation techniques such as relaxing her shoulder when she is feeling stretssed; also reviewed use of thera-cane. Therapy let patient work on her own trigger points sencondary to patient tenising up with light touch.       Neck Exercises: Seated   Other Seated Exercise  4 way neck AROM x 4 each way - comfortable ROM       Neck Exercises: Supine   Other Supine Exercise  reviewed supine decpression and breathing. Patient reported pain with deep breathing.  Shoulder press with breathing x10       Modalities   Modalities  Moist Heat      Moist Heat Therapy   Number Minutes Moist Heat  15 Minutes    Moist Heat Location  Cervical      Electrical  Stimulation   Electrical Stimulation Location  upper traps, thoracic    Electrical Stimulation Action  IFC x15 min     Electrical Stimulation Parameters  15    Electrical Stimulation Goals  Pain      Manual Therapy   Manual Therapy  Soft tissue mobilization;Manual Traction    Soft tissue mobilization  seated IASTYM to upper traps and paraspinals;     Manual Traction  tried manual traction therapist got patient in postiion but before therapist could apply pressure the patient reported increased pain. The patient is likley tensing up in anticipation of pain.              PT Education - 11/28/18 0803    Education Details  improtance of movement and relaxation    Person(s) Educated  Patient    Methods  Tactile cues;Verbal cues;Demonstration;Explanation    Comprehension  Verbalized understanding;Verbal cues required;Tactile cues required;Returned demonstration       PT Short Term Goals - 11/28/18 1048      PT SHORT TERM GOAL #1   Title  Pt will report reduced symptoms in Rt UE and hand by 25%    Time   4    Period  Weeks    Status  On-going    Target Date  12/11/18      PT SHORT TERM GOAL #2   Title  Pt will be able to improve comfort at rest to minimal, less than 4/10, 50% of the time    Time  4    Period  Weeks    Status  On-going      PT SHORT TERM GOAL #3   Title  Pt will be able to use ice, HEP, positioning at home to control pain    Time  4    Status  On-going        PT Long Term Goals - 11/13/18 1319      PT LONG TERM GOAL #1   Title  Pt will be able to decrease FOTO limitation to no more than 27% limited    Time  8    Period  Weeks    Status  New    Target Date  01/08/19      PT LONG TERM GOAL #2   Title  Pt will be I with more advanced HEP for stabilization and ROM of cervical spine    Time  8    Period  Weeks    Status  New    Target Date  01/08/19      PT LONG TERM GOAL #3   Title  Pt will be able to lift with proper body mechanics and min increase in neck pain    Time  8    Period  Weeks    Status  New    Target Date  01/08/19      PT LONG TERM GOAL #4   Title  Pt will report min occasional pain in Rt UE (centralization of pain )    Time  8    Period  Weeks    Status  New    Target Date  01/08/19            Plan - 11/27/18 1208    Clinical Impression Statement  Patient is extremely sensative to light touch and movement today. She had difficulty with simple exercises such as shoulder rolls and scap retraction. She reported increased pain with manual therapy despite very light touch. The patient  is set to return to work on 11/30/2018. It is questionable if she will be able to lift boxes at this time. Therapy reviewed relaxation techniques 2nd to hypersensativity to movement and light touch.    Personal Factors and Comorbidities  Comorbidity 1;Comorbidity 2;Fitness    Comorbidities  Graves disease with hyperthyroidism, HTN    Examination-Activity Limitations  Bed Mobility;Lift;Sleep;Carry;Sit;Reach Overhead    Stability/Clinical Decision Making   Evolving/Moderate complexity    Clinical Decision Making  Moderate    Rehab Potential  Good    PT Frequency  2x / week    PT Duration  8 weeks    PT Treatment/Interventions  ADLs/Self Care Home Management;Electrical Stimulation;Cryotherapy;Moist Heat;Traction;Ultrasound;Therapeutic exercise;Neuromuscular re-education;Patient/family education;Manual techniques;Passive range of motion;Taping;Dry needling;Functional mobility training    PT Next Visit Plan  modalities, gentle stab in supine, manual/STW; try pulleys?, try PROM right shoulder? Repeat US    PT Home Exercise Plan  chin tuck, supine cervical rotation, scapular retraction    Consulted and Agree with Plan of Care  Patient       Patient will benefit from skilled therapeutic intervention in order to improve the following deficits and impairments:  Decreased activity tolerance, Decreased mobility, Increased edema, Increased fascial restricitons, Impaired UE functional use, Impaired sensation, Improper body mechanics, Postural dysfunction, Impaired flexibility, Decreased range of motion, Pain  Visit Diagnosis: 1. Muscle weakness (generalized)   2. Cervicalgia   3. Localized edema   4. Radiculopathy, cervical region        Problem List Patient Active Problem List   Diagnosis Date Noted  . Thyrotoxicosis 12/14/2016  . Murmur, heart 08/10/2014  . Blood pressure elevated without history of HTN 08/10/2014    Dessie Comaavid J Trenisha Lafavor PT DPT  11/28/2018, 11:00 AM  [facility] [address] [place], [place], [address] Phone: [phone]   Fax:  [phone]  Name: Anne [name] MRN: [id number] Date of Birth: 1980-07-21

## 2018-12-02 ENCOUNTER — Ambulatory Visit: Payer: BC Managed Care – PPO | Admitting: Physical Therapy

## 2018-12-02 ENCOUNTER — Other Ambulatory Visit: Payer: Self-pay

## 2018-12-02 DIAGNOSIS — M542 Cervicalgia: Secondary | ICD-10-CM

## 2018-12-02 DIAGNOSIS — M6281 Muscle weakness (generalized): Secondary | ICD-10-CM

## 2018-12-02 DIAGNOSIS — M5412 Radiculopathy, cervical region: Secondary | ICD-10-CM | POA: Diagnosis not present

## 2018-12-02 DIAGNOSIS — R600 Localized edema: Secondary | ICD-10-CM

## 2018-12-02 NOTE — Therapy (Addendum)
[place], [place], [address] Phone: [phone]   Fax:  [phone]  Physical Therapy Treatment/Discharge   Patient Details  Name: Anne [name] MRN: [id number] Date of Birth: 1980/08/18 Referring Provider (PT): Dr. Tonny Bollman Dibas   Encounter Date: 12/02/2018  PT End of Session - 12/02/18 1040    Visit Number  4    Number of Visits  16    Date for PT Re-Evaluation  01/08/19    PT Start Time  1019   Patient 4 minutes late   PT Stop Time  1030   could not tolerate treatment. Patien tsent back to MD 2nd to increaing numbness and decreased function onf her right UE   PT Time Calculation (min)  11 min    Activity Tolerance  Patient tolerated treatment well;Patient limited by pain    Behavior During Therapy  [facility] for tasks assessed/performed       Past Medical History:  Diagnosis Date  . Abnormal echocardiogram    a. 11/2016 - increased LVOT gradient.  . Asthma   . Mild mitral regurgitation   . Normocytic anemia   . Obesity   . Thyrotoxicosis 11/2014  . Tobacco abuse   . Tricuspid regurgitation     Past Surgical History:  Procedure Laterality Date  . ECTOPIC PREGNANCY SURGERY    . MANDIBLE SURGERY    . TUBAL LIGATION      There were no vitals filed for this visit.  Subjective Assessment - 12/02/18 1032    Subjective  Patient has had incrreasing numbness into her arm and into her leg over the past few days. She contacted her MD she reports but was told he is on vacation.    Pertinent History  Grave's disease , low back pain, HTN    Limitations  Sitting;Lifting;Standing;Writing;House hold activities    Diagnostic tests  none yet, MRI if needed    Patient Stated Goals  Pt would like normal use of her hand.    Currently in Pain?  Yes    Pain Score  9     Pain Location  Neck    Pain Orientation  Right;Left    Pain Descriptors / Indicators  Aching    Pain Onset  More than a month ago    Pain  Frequency  Constant    Aggravating Factors   use of the arm    Effect of Pain on Daily Activities  difficulty perfroming ADL's         [facility] PT Assessment - 12/02/18 0001      Assessment   Medical Diagnosis  cervical radiculopathy    Referring Provider (PT)  Dr. Tonny Bollman Dibas                   [facility] Adult PT Treatment/Exercise - 12/02/18 0001      Exercises   Exercises  Shoulder      Shoulder Exercises: Pulleys   Flexion Limitations  tolerated well at first but after about thirty dseconds reported increased burining and pinching in her shoulder       Electrical Stimulation   Electrical Stimulation Location  Held 2nd to no carryover       Manual Therapy   Soft tissue mobilization  aattmpeted light softt tissue mobilization but the patient reported increased symptoms. She had low tolerance to light touch     Manual Traction  again only able to place hands in proper position. No over pressure applied  before patient repoted increased pain.       Neck Exercises: Stretches   Upper Trapezius Stretch Limitations  reviewed upper trap stretch reported no improvement but increased symptoms     Levator Stretch Limitations  reviewed levator stretch reported no improvement in symptoms                PT Short Term Goals - 11/28/18 1048      PT SHORT TERM GOAL #1   Title  Pt will report reduced symptoms in Rt UE and hand by 25%    Time  4    Period  Weeks    Status  On-going    Target Date  12/11/18      PT SHORT TERM GOAL #2   Title  Pt will be able to improve comfort at rest to minimal, less than 4/10, 50% of the time    Time  4    Period  Weeks    Status  On-going      PT SHORT TERM GOAL #3   Title  Pt will be able to use ice, HEP, positioning at home to control pain    Time  4    Status  On-going        PT Long Term Goals - 11/13/18 1319      PT LONG TERM GOAL #1   Title  Pt will be able to decrease FOTO limitation to no more than 27% limited     Time  8    Period  Weeks    Status  New    Target Date  01/08/19      PT LONG TERM GOAL #2   Title  Pt will be I with more advanced HEP for stabilization and ROM of cervical spine    Time  8    Period  Weeks    Status  New    Target Date  01/08/19      PT LONG TERM GOAL #3   Title  Pt will be able to lift with proper body mechanics and min increase in neck pain    Time  8    Period  Weeks    Status  New    Target Date  01/08/19      PT LONG TERM GOAL #4   Title  Pt will report min occasional pain in Rt UE (centralization of pain )    Time  8    Period  Weeks    Status  New    Target Date  01/08/19            Plan - 12/02/18 1040    Clinical Impression Statement  Therapy attmepted to perfrom light manual therapy on the patient. Sh reported increased numbness and tingling with light touch. Therapy was not able to addd any pressure before increased symptoms. She then tried light movement which increased her numbness and pain. At this time her symptoms are limiting her ability for physcial therapy to be beneifcial. She has had some positive reaction to modalities but has had no carryover. Her function has decreased as well as her tolerance to activity. The patient will be put on hold and sent back to MD for further follow up.    Personal Factors and Comorbidities  Comorbidity 1;Comorbidity 2;Fitness    Comorbidities  Graves disease with hyperthyroidism, HTN    Examination-Activity Limitations  Bed Mobility;Lift;Sleep;Carry;Sit;Reach Overhead    Stability/Clinical Decision Making  Unstable/Unpredictable    Clinical Decision  Making  Moderate    Rehab Potential  Poor    PT Frequency  2x / week    PT Duration  8 weeks    PT Treatment/Interventions  ADLs/Self Care Home Management;Electrical Stimulation;Cryotherapy;Moist Heat;Traction;Ultrasound;Therapeutic exercise;Neuromuscular re-education;Patient/family education;Manual techniques;Passive range of motion;Taping;Dry  needling;Functional mobility training    PT Next Visit Plan  modalities, gentle stab in supine, manual/STW; try pulleys?, try PROM right shoulder? Repeat US    PT Home Exercise Plan  chin tuck, supine cervical rotation, scapular retraction    Consulted and Agree with Plan of Care  Patient       Patient will benefit from skilled therapeutic intervention in order to improve the following deficits and impairments:  Decreased activity tolerance, Decreased mobility, Increased edema, Increased fascial restricitons, Impaired UE functional use, Impaired sensation, Improper body mechanics, Postural dysfunction, Impaired flexibility, Decreased range of motion, Pain  Visit Diagnosis: 1. Muscle weakness (generalized)   2. Cervicalgia   3. Localized edema   4. Radiculopathy, cervical region     PHYSICAL THERAPY DISCHARGE SUMMARY  Visits from Start of Care: 4  Current functional level related to goals / functional outcomes: Sent back to MD 2nd to difficulty tolerating any activity at therapy   Remaining deficits: Pain    Education / Equipment: HEP   Plan: Patient agrees to discharge.  Patient goals were not met. Patient is being discharged due to not returning since the last visit.  ?????       Problem List Patient Active Problem List   Diagnosis Date Noted  . Thyrotoxicosis 12/14/2016  . Murmur, heart 08/10/2014  . Blood pressure elevated without history of HTN 08/10/2014    Carney Living PT DPT  12/02/2018, 11:08 AM  [facility] [address] [place], [place], [address] Phone: [phone]   Fax:  [phone]  Name: Anne [name] MRN: [id number] Date of Birth: [birth year]

## 2018-12-04 ENCOUNTER — Ambulatory Visit: Payer: BC Managed Care – PPO | Admitting: Physical Therapy

## 2018-12-04 IMAGING — CT CT ANGIO CHEST
2 of 6 series · 18 of 36 positions shown · IV contrast (ISOVUE 370)
Comparison: Chest radiographs 12/13/2016

CLINICAL DATA: Chest pain, shortness of breath, and bilateral foot
swelling. Positive D-dimer.

EXAM:
CT ANGIOGRAPHY CHEST WITH CONTRAST
TECHNIQUE: Multidetector CT imaging of the chest was performed using the
standard protocol during bolus administration of intravenous
contrast. Multiplanar CT image reconstructions and MIPs were
obtained to evaluate the vascular anatomy.
CONTRAST:  100 mL Isovue 370

[Series 6: thins for pacs · axial · 0.58mm/px · z∈[+1277,+1511]mm · 17 of 260 slices shown]
[im 13/260  lung]
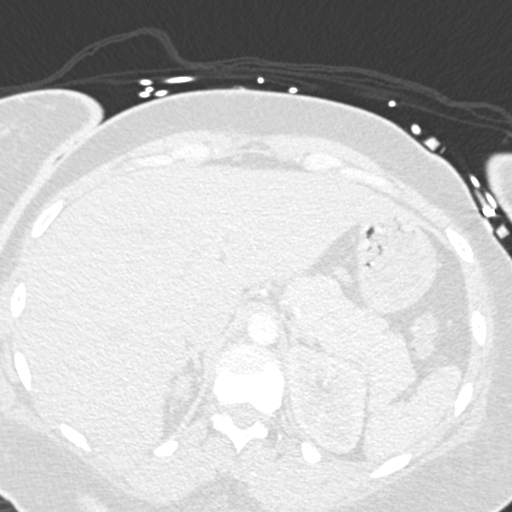
[im 26/260  mediastinal]
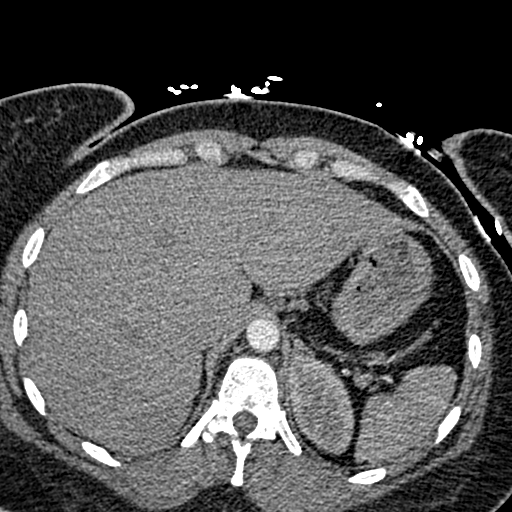
[im 39/260  lung]
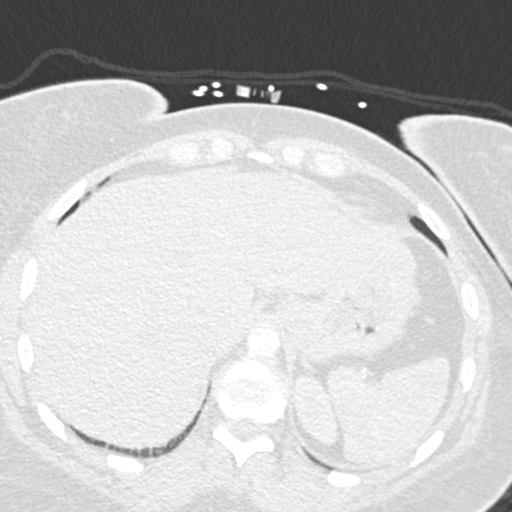
[im 52/260  mediastinal]
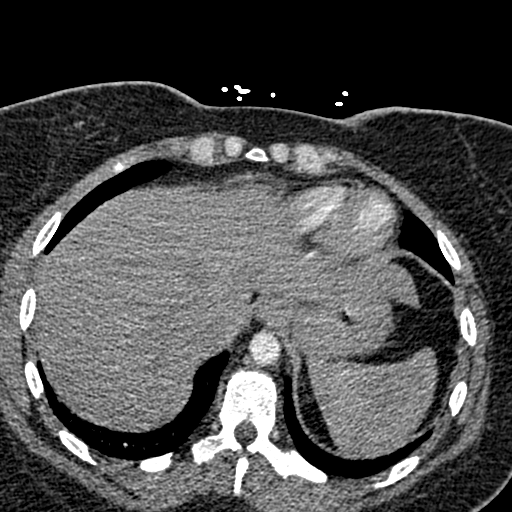
[im 78/260  lung]
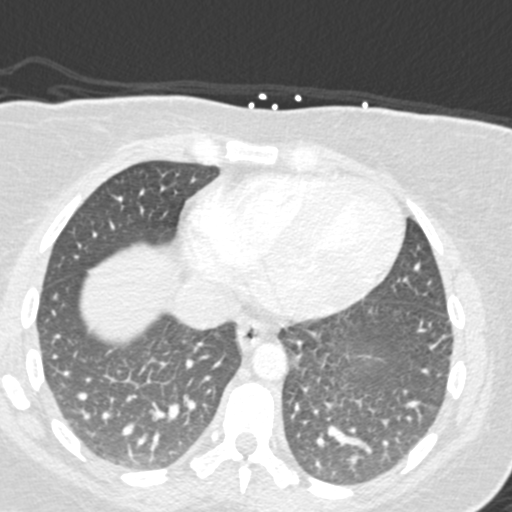
[im 91/260  mediastinal]
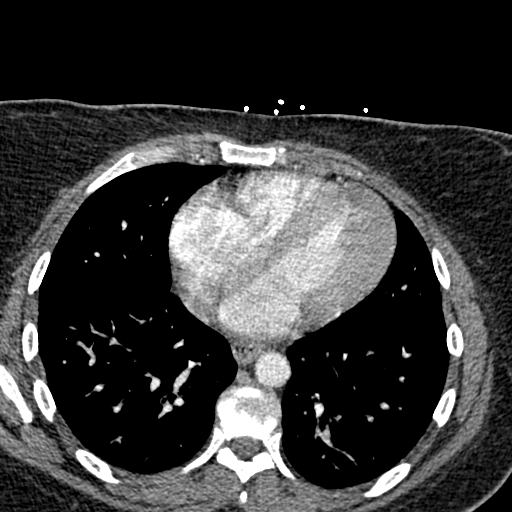
[im 104/260  lung]
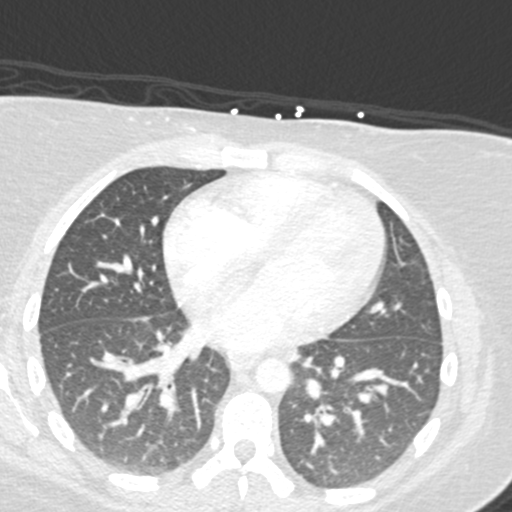
[im 117/260  mediastinal]
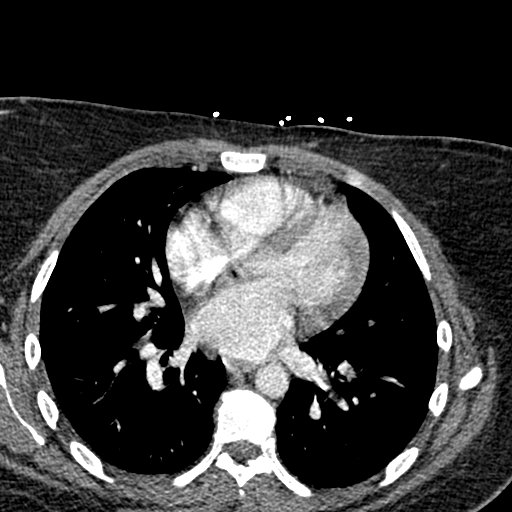
[im 130/260  lung]
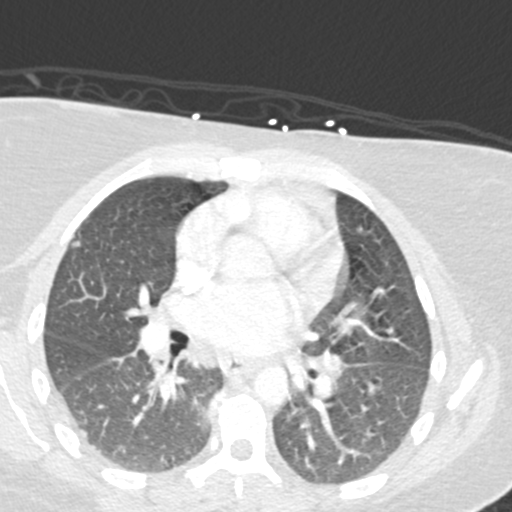
[im 143/260  mediastinal]
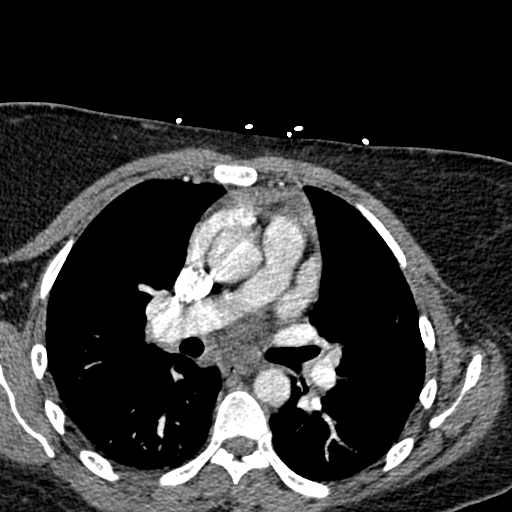
[im 156/260  lung]
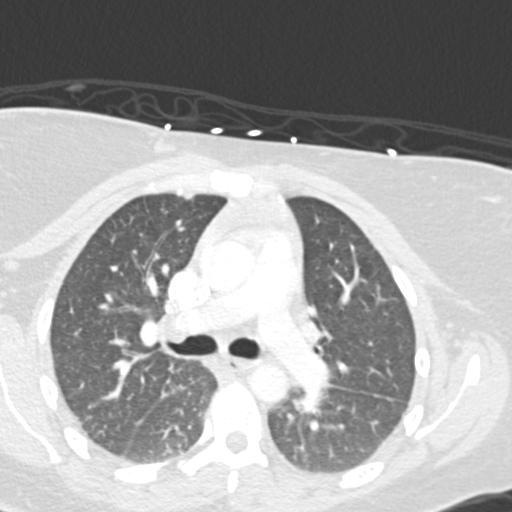
[im 169/260  mediastinal]
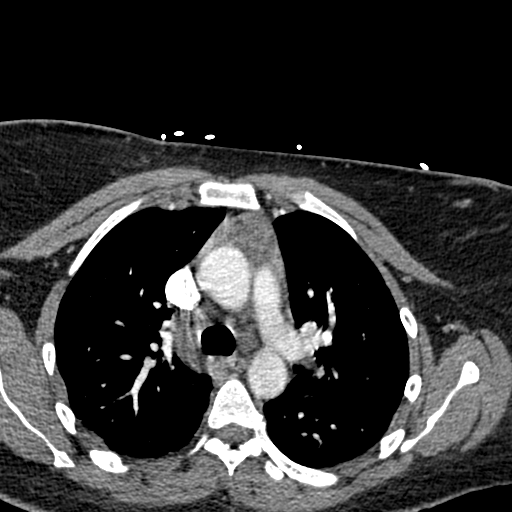
[im 182/260  lung]
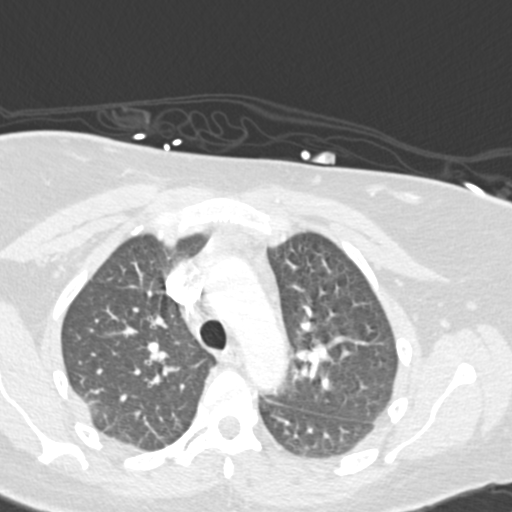
[im 208/260  mediastinal]
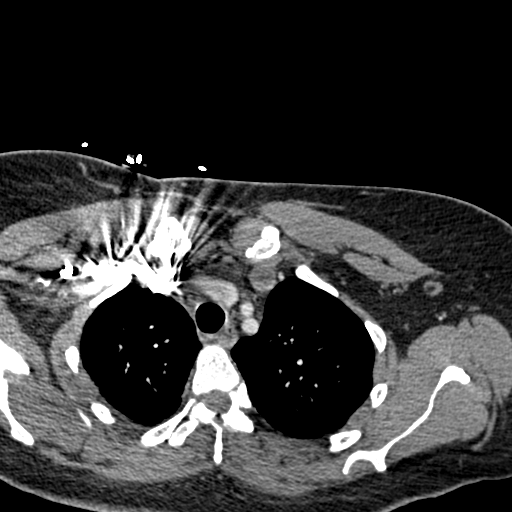
[im 221/260  lung]
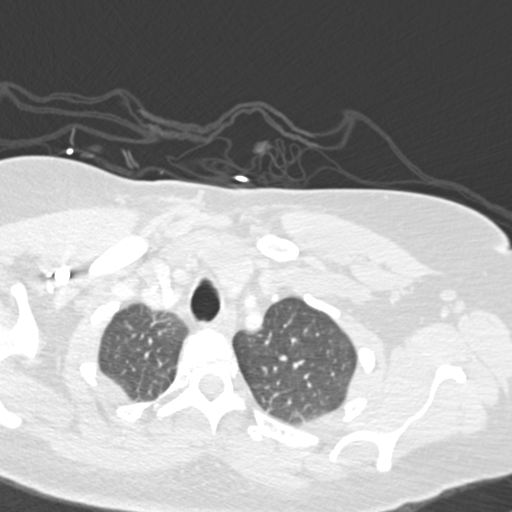
[im 234/260  mediastinal]
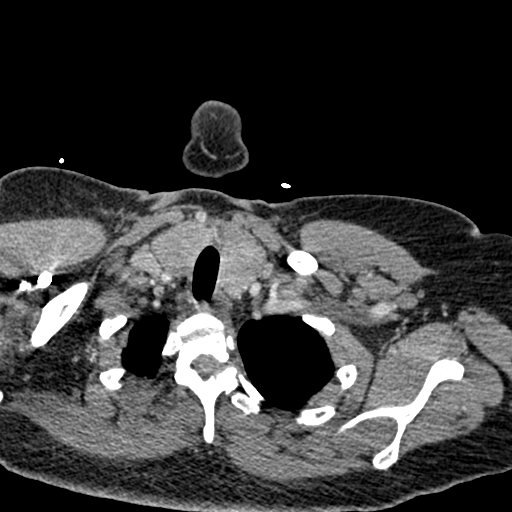
[im 247/260  lung]
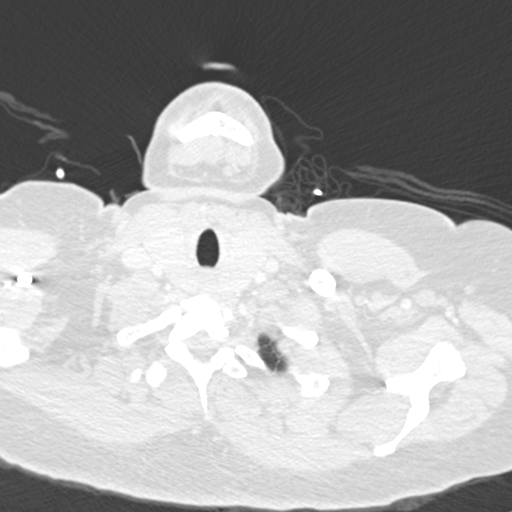

[Series 8: coronal mpr · coronal · 0.47mm/px · 1 of 116 slices shown]
[im 58/116  mediastinal]
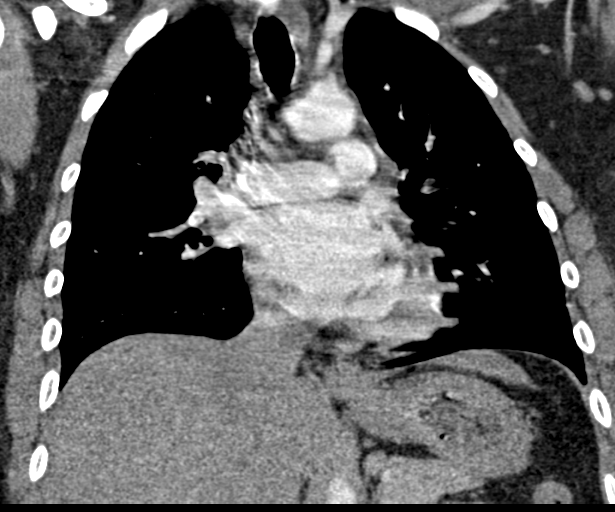

[18 of 36 positions shown; findings below may reference images not displayed]

FINDINGS: Cardiovascular: Pulmonary arterial opacification is adequate to the
segmental level without emboli identified. Motion artifact limits
assessment of the lingular and, to a lesser extent, left lower lobe
arteries. There is no evidence of thoracic aortic aneurysm or
dissection. The heart is borderline enlarged. No pericardial
effusion is present.

Mediastinum/Nodes: Mild to moderate diffuse enlargement of the
thyroid without dominant nodule apparent by CT. No enlarged
axillary, mediastinal, or hilar lymph nodes. Grossly unremarkable
esophagus.

Lungs/Pleura: No pleural effusion or pneumothorax. Evaluation of the
lung parenchyma is mildly limited by respiratory motion artifact.
There is no evidence of pneumonia or edema. A 3 mm nodule is noted
along the right minor fissure (series 7, image 44).

Upper Abdomen: Unremarkable.

Musculoskeletal: Mild thoracic spondylosis. No acute osseous
abnormality or suspicious osseous lesion.

Review of the MIP images confirms the above findings.
IMPRESSION: 1. No pulmonary emboli or other acute abnormality identified in the
chest. Mild motion artifact as above.
2. **An incidental finding of potential clinical significance has
been found. 3 mm nodule along the right minor fissure. No follow-up
needed if patient is low-risk. Non-contrast chest CT can be
considered in 12 months if patient is high-risk. This recommendation
follows the consensus statement: Guidelines for Management of
Incidental Pulmonary Nodules Detected on CT Images: From the
3. Diffuse thyroid enlargement.

## 2018-12-09 ENCOUNTER — Encounter: Payer: BC Managed Care – PPO | Admitting: Physical Therapy

## 2018-12-09 DIAGNOSIS — M5412 Radiculopathy, cervical region: Secondary | ICD-10-CM | POA: Diagnosis not present

## 2018-12-11 ENCOUNTER — Encounter: Payer: BC Managed Care – PPO | Admitting: Physical Therapy

## 2018-12-16 ENCOUNTER — Other Ambulatory Visit: Payer: Self-pay | Admitting: Family Medicine

## 2018-12-16 ENCOUNTER — Encounter: Payer: BC Managed Care – PPO | Admitting: Physical Therapy

## 2018-12-16 DIAGNOSIS — M5412 Radiculopathy, cervical region: Secondary | ICD-10-CM

## 2018-12-18 ENCOUNTER — Encounter: Payer: BC Managed Care – PPO | Admitting: Physical Therapy

## 2018-12-23 ENCOUNTER — Ambulatory Visit: Payer: BC Managed Care – PPO | Admitting: Physical Therapy

## 2018-12-24 ENCOUNTER — Ambulatory Visit: Payer: BC Managed Care – PPO | Admitting: Physical Therapy

## 2019-01-04 ENCOUNTER — Other Ambulatory Visit: Payer: Self-pay

## 2019-01-04 ENCOUNTER — Ambulatory Visit
Admission: RE | Admit: 2019-01-04 | Discharge: 2019-01-04 | Disposition: A | Discharge status: Home / Self Care | Payer: BC Managed Care – PPO | Source: Ambulatory Visit | Attending: Family Medicine | Admitting: Family Medicine

## 2019-01-04 DIAGNOSIS — M5412 Radiculopathy, cervical region: Secondary | ICD-10-CM

## 2019-01-04 DIAGNOSIS — M50121 Cervical disc disorder at C4-C5 level with radiculopathy: Secondary | ICD-10-CM | POA: Diagnosis not present

## 2019-01-19 DIAGNOSIS — S86912A Strain of unspecified muscle(s) and tendon(s) at lower leg level, left leg, initial encounter: Secondary | ICD-10-CM | POA: Diagnosis not present

## 2019-01-19 DIAGNOSIS — E0590 Thyrotoxicosis, unspecified without thyrotoxic crisis or storm: Secondary | ICD-10-CM | POA: Diagnosis not present

## 2019-01-19 DIAGNOSIS — I10 Essential (primary) hypertension: Secondary | ICD-10-CM | POA: Diagnosis not present

## 2019-01-19 DIAGNOSIS — M5412 Radiculopathy, cervical region: Secondary | ICD-10-CM | POA: Diagnosis not present

## 2019-01-28 DIAGNOSIS — M5412 Radiculopathy, cervical region: Secondary | ICD-10-CM | POA: Diagnosis not present

## 2019-01-28 DIAGNOSIS — M542 Cervicalgia: Secondary | ICD-10-CM | POA: Diagnosis not present

## 2019-01-28 DIAGNOSIS — M5030 Other cervical disc degeneration, unspecified cervical region: Secondary | ICD-10-CM | POA: Diagnosis not present

## 2019-02-16 DIAGNOSIS — J069 Acute upper respiratory infection, unspecified: Secondary | ICD-10-CM | POA: Diagnosis not present

## 2019-02-27 DIAGNOSIS — R0781 Pleurodynia: Secondary | ICD-10-CM | POA: Diagnosis not present

## 2019-02-27 DIAGNOSIS — E0590 Thyrotoxicosis, unspecified without thyrotoxic crisis or storm: Secondary | ICD-10-CM | POA: Diagnosis not present

## 2019-02-27 DIAGNOSIS — R142 Eructation: Secondary | ICD-10-CM | POA: Diagnosis not present

## 2019-02-27 DIAGNOSIS — I10 Essential (primary) hypertension: Secondary | ICD-10-CM | POA: Diagnosis not present

## 2019-03-10 DIAGNOSIS — M5412 Radiculopathy, cervical region: Secondary | ICD-10-CM | POA: Diagnosis not present

## 2019-03-10 DIAGNOSIS — M542 Cervicalgia: Secondary | ICD-10-CM | POA: Diagnosis not present

## 2019-03-10 DIAGNOSIS — M5030 Other cervical disc degeneration, unspecified cervical region: Secondary | ICD-10-CM | POA: Diagnosis not present

## 2019-03-13 DIAGNOSIS — R062 Wheezing: Secondary | ICD-10-CM | POA: Diagnosis not present

## 2019-03-13 DIAGNOSIS — I10 Essential (primary) hypertension: Secondary | ICD-10-CM | POA: Diagnosis not present

## 2019-03-13 DIAGNOSIS — E0500 Thyrotoxicosis with diffuse goiter without thyrotoxic crisis or storm: Secondary | ICD-10-CM | POA: Diagnosis not present

## 2019-03-18 DIAGNOSIS — Z720 Tobacco use: Secondary | ICD-10-CM | POA: Diagnosis not present

## 2019-03-18 DIAGNOSIS — Z5181 Encounter for therapeutic drug level monitoring: Secondary | ICD-10-CM | POA: Diagnosis not present

## 2019-03-18 DIAGNOSIS — R3915 Urgency of urination: Secondary | ICD-10-CM | POA: Diagnosis not present

## 2019-03-18 DIAGNOSIS — E0500 Thyrotoxicosis with diffuse goiter without thyrotoxic crisis or storm: Secondary | ICD-10-CM | POA: Diagnosis not present

## 2019-03-18 DIAGNOSIS — E049 Nontoxic goiter, unspecified: Secondary | ICD-10-CM | POA: Diagnosis not present

## 2019-04-21 DIAGNOSIS — R0989 Other specified symptoms and signs involving the circulatory and respiratory systems: Secondary | ICD-10-CM | POA: Diagnosis not present

## 2019-04-21 DIAGNOSIS — M25512 Pain in left shoulder: Secondary | ICD-10-CM | POA: Diagnosis not present

## 2019-04-21 DIAGNOSIS — R05 Cough: Secondary | ICD-10-CM | POA: Diagnosis not present

## 2019-04-21 DIAGNOSIS — Z03818 Encounter for observation for suspected exposure to other biological agents ruled out: Secondary | ICD-10-CM | POA: Diagnosis not present

## 2019-04-28 ENCOUNTER — Ambulatory Visit: Payer: BC Managed Care – PPO | Attending: Family Medicine | Admitting: Physical Therapy

## 2019-04-28 DIAGNOSIS — M25512 Pain in left shoulder: Secondary | ICD-10-CM | POA: Insufficient documentation

## 2019-04-28 DIAGNOSIS — M542 Cervicalgia: Secondary | ICD-10-CM | POA: Insufficient documentation

## 2019-04-28 DIAGNOSIS — M6281 Muscle weakness (generalized): Secondary | ICD-10-CM | POA: Insufficient documentation

## 2019-04-29 ENCOUNTER — Other Ambulatory Visit: Payer: Self-pay

## 2019-04-29 ENCOUNTER — Encounter: Payer: Self-pay | Admitting: Physical Therapy

## 2019-04-29 ENCOUNTER — Ambulatory Visit: Payer: BC Managed Care – PPO | Admitting: Physical Therapy

## 2019-04-29 DIAGNOSIS — M25512 Pain in left shoulder: Secondary | ICD-10-CM | POA: Diagnosis not present

## 2019-04-29 DIAGNOSIS — M6281 Muscle weakness (generalized): Secondary | ICD-10-CM

## 2019-04-29 DIAGNOSIS — M542 Cervicalgia: Secondary | ICD-10-CM

## 2019-04-30 DIAGNOSIS — Z20828 Contact with and (suspected) exposure to other viral communicable diseases: Secondary | ICD-10-CM | POA: Diagnosis not present

## 2019-04-30 DIAGNOSIS — J069 Acute upper respiratory infection, unspecified: Secondary | ICD-10-CM | POA: Diagnosis not present

## 2019-04-30 DIAGNOSIS — R05 Cough: Secondary | ICD-10-CM | POA: Diagnosis not present

## 2019-04-30 NOTE — Therapy (Signed)
[facility] [address], [address] [place], [place], [address] Phone: [phone]   Fax:  [phone]  Physical Therapy Evaluation  Patient Details  Name: ZOEY BIDWELL MRN: [id number] Date of Birth: 08-Sep-1980 Referring Provider (PT): Farris Has, MD    Encounter Date: 04/29/2019  PT End of Session - 04/30/19 0757    Visit Number  1    Date for PT Re-Evaluation  06/12/19    Authorization Type  BCBS    Authorization Time Period  04/29/19 to 06/12/19    PT Start Time  1202   Pt arrived late   PT Stop Time  1230    PT Time Calculation (min)  28 min    Activity Tolerance  Patient limited by pain    Behavior During Therapy  [facility] for tasks assessed/performed       Past Medical History:  Diagnosis Date  . Abnormal echocardiogram    a. 11/2016 - increased LVOT gradient.  . Asthma   . Mild mitral regurgitation   . Normocytic anemia   . Obesity   . Thyrotoxicosis 11/2014  . Tobacco abuse   . Tricuspid regurgitation     Past Surgical History:  Procedure Laterality Date  . ECTOPIC PREGNANCY SURGERY    . MANDIBLE SURGERY    . TUBAL LIGATION      There were no vitals filed for this visit.   Subjective Assessment - 04/29/19 1202    Subjective  Pt states her Lt shoulder has been bothering her for several months. She has pain and difficulty with lifting the arm.    Pertinent History  graves disease    Limitations  Lifting    Diagnostic tests  none    Patient Stated Goals  improve strength and pain    Currently in Pain?  Yes    Pain Score  7     Pain Location  Shoulder    Pain Orientation  Left;Anterior;Lateral    Pain Descriptors / Indicators  Aching;Sore    Pain Type  Chronic pain    Pain Radiating Towards  none noted    Pain Onset  More than a month ago    Aggravating Factors   lifting the arm    Pain Relieving Factors  propping the arm    Effect of Pain on Daily Activities  limited ability to dress herself          [facility] PT Assessment - 04/30/19 0001      Assessment   Medical Diagnosis  Pain in Lt shoulder     Referring Provider (PT)  Farris Has, MD     Onset Date/Surgical Date  --   several months    Hand Dominance  Right    Next MD Visit  04/30/19      Precautions   Precautions  None      Balance Screen   Has the patient fallen in the past 6 months  No    Has the patient had a decrease in activity level because of a fear of falling?   No    Is the patient reluctant to leave their home because of a fear of falling?   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[employer] center      [employer] Status  Within Functional Limits for tasks assessed  Observation/Other Assessments   Focus on Therapeutic Outcomes (FOTO)   unable due to time constraints       Sensation   Additional Comments  reports occasional numbness/tingling in B hands when sleeping       Posture/Postural Control   Posture Comments  rounded shoulders      AROM   Overall AROM Comments  all AROM measurements difficult to accurately assess secondary to pt guarding; cervical AROM: painful all directions     Right Shoulder Flexion  115 Degrees   pain    Right Shoulder ABduction  55 Degrees   pain      Strength   Right Shoulder Flexion  3/5    Right Shoulder ABduction  3/5    Right Shoulder Internal Rotation  4/5    Right Shoulder External Rotation  4/5    Left Shoulder Flexion  3/5    Left Shoulder ABduction  3/5    Left Shoulder Internal Rotation  3/5    Left Shoulder External Rotation  3/5      Palpation   Palpation comment  tenderness Lt clavicle, Lt anterior shoulder (pt sensitive to all touch making specific assessment difficult)       Special Tests   Other special tests  (+) ULTT median/ulnar nerve                 Objective measurements completed on examination: See above  findings.      [facility] Adult PT Treatment/Exercise - 04/30/19 0001      Exercises   Exercises  Neck      Neck Exercises: Seated   Other Seated Exercise  --      Neck Exercises: Supine   Neck Retraction  5 reps;3 secs    Neck Retraction Limitations  HEP demo     Other Supine Exercise  scap squeeze x5 reps              PT Education - 04/30/19 0756    Education Details  eval findings/POC; implemented HEP    Person(s) Educated  Patient    Methods  Explanation;Handout    Comprehension  Verbalized understanding       PT Short Term Goals - 04/30/19 0938      PT SHORT TERM GOAL #1   Title  Pt will be independent with her initial HEP to improve pain and Lt UE function.    Time  3    Period  Weeks    Status  New    Target Date  05/20/19        PT Long Term Goals - 04/30/19 0939      PT LONG TERM GOAL #1   Title  Pt will have greater than 140 deg of active Lt shoulder flexion which will allow her to reach overhead into her kitchen cabinet.    Time  6    Period  Weeks    Status  New    Target Date  06/19/19      PT LONG TERM GOAL #2   Title  Pt will report atleast 50% improvement in her ability to fasten her bra when getting dressed.    Time  6    Period  Weeks    Status  New      PT LONG TERM GOAL #3   Title  Pt will have atleast 4/5 MMT strength of the Lt UE with or without pain, which will improve her ability to complete self care and other  daily activity with less difficulty.    Time  6    Period  Weeks    Status  New             Plan - 04/30/19 0759    Clinical Impression Statement  Pt is a 39 y.o F referred to OPPT with complaints of worsening Lt shoulder pain over the past several months. She reports occasional numbness/tingling of the hands when sleeping but denies this during today's visit. Pt arrived late to her appointment and was heavily guarded, which limited the therapist's ability to accurately assess ROM, strength, etc. She has noted  limitations in active Lt shoulder ROM primarily with elevation. Strength is limited, although difficult to assess how much is related to pain at this time. Pt has heightened response to gentle palpation of the shoulder, limiting this. In addition to Lt shoulder pain/weakness, pt has reported pain along the clavicular region and underneath the arm with positive upper limb tension testing. Pt would benefit from skilled PT to decrease muscle guarding, increase shoulder ROM, strength and promote her return to work and daily activity.    Personal Factors and Comorbidities  Fitness;Past/Current Experience    Examination-Activity Limitations  Dressing;Sleep;Reach Overhead;Carry;Lift    Examination-Participation Restrictions  Laundry;Cleaning;Community Activity    Stability/Clinical Decision Making  Evolving/Moderate complexity    Clinical Decision Making  Moderate    Rehab Potential  Fair    PT Frequency  2x / week    PT Duration  6 weeks    PT Treatment/Interventions  ADLs/Self Care Home Management;Cryotherapy;Electrical Stimulation;Moist Heat;Therapeutic activities;Therapeutic exercise;Neuromuscular re-education;Manual techniques;Dry needling;Passive range of motion;Patient/family education;Taping    PT Next Visit Plan  Lt UE nerve flossing; gentle pec stretch, shoulder rolls; trial shoulder isometrics or supine shoulder AAROM; manual as needed to decrease spasm/tension    PT Home Exercise Plan  [email or web address]    Consulted and Agree with Plan of Care  Patient       Patient will benefit from skilled therapeutic intervention in order to improve the following deficits and impairments:  Decreased activity tolerance, Impaired flexibility, Impaired UE functional use, Hypomobility, Decreased strength, Decreased range of motion, Postural dysfunction, Increased muscle spasms, Pain, Impaired sensation  Visit Diagnosis: Left shoulder pain, unspecified chronicity  Muscle weakness  (generalized)  Cervicalgia     Problem List Patient Active Problem List   Diagnosis Date Noted  . Thyrotoxicosis 12/14/2016  . Murmur, heart 08/10/2014  . Blood pressure elevated without history of HTN 08/10/2014    9:48 AM,04/30/19 Sherol Dade PT, DPT [facility] [address], [place], [place], [address] Phone: [phone]   Fax:  [phone]  Name: KISMET FACEMIRE MRN: [id number] Date of Birth: Jun 13, 1980

## 2019-04-30 NOTE — Patient Instructions (Addendum)
Access Code: [email or web address]  URL: [email or web address]  Date: 04/30/2019  Prepared by: Donita Brooks    Exercises Supine Scapular Retraction- 10 reps- 3 hold- 3x daily- 7x weekly  Supine Chin Tuck- 5 reps- 5 hold- 3x daily- 7x weekly    [facility] [address] [place], [place] [address] Phone # [phone] Fax [phone]

## 2019-05-01 ENCOUNTER — Ambulatory Visit: Payer: BC Managed Care – PPO | Admitting: Physical Therapy

## 2019-05-01 DIAGNOSIS — Z20822 Contact with and (suspected) exposure to covid-19: Secondary | ICD-10-CM | POA: Diagnosis not present

## 2019-05-01 DIAGNOSIS — R05 Cough: Secondary | ICD-10-CM | POA: Diagnosis not present

## 2019-05-01 DIAGNOSIS — Z20828 Contact with and (suspected) exposure to other viral communicable diseases: Secondary | ICD-10-CM | POA: Diagnosis not present

## 2019-05-06 ENCOUNTER — Encounter: Payer: BC Managed Care – PPO | Admitting: Physical Therapy

## 2019-05-20 ENCOUNTER — Ambulatory Visit
Admission: RE | Admit: 2019-05-20 | Discharge: 2019-05-20 | Disposition: A | Discharge status: Home / Self Care | Payer: BC Managed Care – PPO | Source: Ambulatory Visit | Attending: Family Medicine | Admitting: Family Medicine

## 2019-05-20 ENCOUNTER — Other Ambulatory Visit: Payer: Self-pay | Admitting: Family Medicine

## 2019-05-20 DIAGNOSIS — R05 Cough: Secondary | ICD-10-CM

## 2019-05-20 DIAGNOSIS — R059 Cough, unspecified: Secondary | ICD-10-CM

## 2019-05-22 DIAGNOSIS — M79601 Pain in right arm: Secondary | ICD-10-CM | POA: Diagnosis not present

## 2019-05-22 DIAGNOSIS — U071 COVID-19: Secondary | ICD-10-CM | POA: Diagnosis not present

## 2019-05-28 ENCOUNTER — Encounter: Payer: Self-pay | Admitting: Physical Therapy

## 2019-05-28 ENCOUNTER — Ambulatory Visit: Payer: BC Managed Care – PPO | Attending: Family Medicine | Admitting: Physical Therapy

## 2019-05-28 ENCOUNTER — Other Ambulatory Visit: Payer: Self-pay

## 2019-05-28 DIAGNOSIS — M25512 Pain in left shoulder: Secondary | ICD-10-CM | POA: Insufficient documentation

## 2019-05-28 DIAGNOSIS — M542 Cervicalgia: Secondary | ICD-10-CM | POA: Insufficient documentation

## 2019-05-28 DIAGNOSIS — M6281 Muscle weakness (generalized): Secondary | ICD-10-CM | POA: Insufficient documentation

## 2019-05-28 DIAGNOSIS — M5412 Radiculopathy, cervical region: Secondary | ICD-10-CM | POA: Diagnosis not present

## 2019-05-28 NOTE — Patient Instructions (Signed)
Access Code: [email or web address]  URL: [email or web address]  Date: 05/28/2019  Prepared by: Loistine Simas Telesa Jeancharles   Exercises  Supine Scapular Retraction - 10 reps - 3 hold - 3x daily - 7x weekly  Supine Chin Tuck - 5 reps - 5 hold - 3x daily - 7x weekly  Supine Isometric Shoulder Extension with Towel - 10 reps - 2 sets - 5 hold - 1x daily - 7x weekly  Supine Shoulder External Rotation with Resistance - 10 reps - 2 sets - 1x daily - 7x weekly  Supine Shoulder Flexion Extension AAROM with Dowel - 10 reps - 3 sets - 1x daily - 7x weekly  Seated Shoulder Flexion AAROM with Pulley Behind - 20 reps - 1 sets - 3x daily - 7x weekly

## 2019-05-28 NOTE — Therapy (Addendum)
[facility] [address], [name], [place], [address] Phone: [phone]   Fax:  [phone]  Physical Therapy Treatment  Patient Details  Name: Anne [name] MRN: [id number] Date of Birth: 05/14/1980 Referring Provider (PT): London Pepper, MD    Encounter Date: 05/28/2019  PT End of Session - 05/28/19 1400    Visit Number  2    Date for PT Re-Evaluation  06/12/19    Authorization Type  BCBS    Authorization Time Period  04/29/19 to 06/12/19    PT Start Time  1400    PT Stop Time  1441    PT Time Calculation (min)  41 min    Activity Tolerance  Patient tolerated treatment well;Patient limited by pain    Behavior During Therapy  [facility] for tasks assessed/performed       Past Medical History:  Diagnosis Date  . Abnormal echocardiogram    a. 11/2016 - increased LVOT gradient.  . Asthma   . Mild mitral regurgitation   . Normocytic anemia   . Obesity   . Thyrotoxicosis 11/2014  . Tobacco abuse   . Tricuspid regurgitation     Past Surgical History:  Procedure Laterality Date  . ECTOPIC PREGNANCY SURGERY    . MANDIBLE SURGERY    . TUBAL LIGATION      There were no vitals filed for this visit.  Subjective Assessment - 05/28/19 1401    Subjective  Pt returns approx 1 month after intial visit due to being out sick.  Pain continues in Lt shoulder, chest, upper back and now includes Rt neck and upper back pain.    Pertinent History  graves disease    Limitations  Lifting    Diagnostic tests  none    Patient Stated Goals  improve strength and pain    Currently in Pain?  Yes    Pain Score  7     Pain Location  Shoulder    Pain Orientation  Right;Posterior    Pain Descriptors / Indicators  Aching;Sore    Pain Type  Chronic pain    Pain Onset  More than a month ago    Pain Frequency  Constant    Aggravating Factors   lifting arm    Pain Relieving Factors  laying down, avoiding use of Lt arm    Effect of Pain on  Daily Activities  dressing         [facility] PT Assessment - 05/28/19 0001      AROM   Overall AROM Comments  signif limitation in A/ROM willingness to move above 90 deg flexion bil due to pain, improved with use of pulleys                   [facility] Adult PT Treatment/Exercise - 05/28/19 0001      Exercises   Exercises  Neck;Shoulder      Neck Exercises: Seated   Neck Retraction  1 rep    Cervical Rotation  Both;5 reps    Cervical Rotation Limitations  in neck retraction      Shoulder Exercises: Supine   External Rotation  Strengthening;Both;10 reps;Theraband    Theraband Level (Shoulder External Rotation)  Level 2 (Red)    Flexion  AAROM;Both;15 reps    Flexion Limitations  slow, chest press with dowel, PT cued neck retraction and scapular depression to avoid hiking      Shoulder Exercises: Pulleys   Flexion  2 minutes  Shoulder Exercises: ROM/Strengthening   Other ROM/Strengthening Exercises  hands on counter bil shoulder passive flexion by way of walkouts and trunk flexion x 3 reps    Other ROM/Strengthening Exercises  attempted standing wall slides and seated shoulder flexion slides on elevated mat table: Pt could not tolerate due to pain      Shoulder Exercises: Isometric Strengthening   Extension  Supine;5X5"      Manual Therapy   Manual Therapy  Manual Traction    Manual Traction  Gr I/II cervical manual traction, Pt reported increased pain but reduced Rt hand numbness             PT Education - 05/28/19 1440    Education Details  Access Code: [email or web address]    Person(s) Educated  Patient    Methods  Explanation;Demonstration;Handout;Verbal cues    Comprehension  Verbalized understanding;Returned demonstration       PT Short Term Goals - 04/30/19 0938      PT SHORT TERM GOAL #1   Title  Pt will be independent with her initial HEP to improve pain and Lt UE function.    Time  3    Period  Weeks    Status  New    Target Date  05/20/19         PT Long Term Goals - 04/30/19 0939      PT LONG TERM GOAL #1   Title  Pt will have greater than 140 deg of active Lt shoulder flexion which will allow her to reach overhead into her kitchen cabinet.    Time  6    Period  Weeks    Status  New    Target Date  06/19/19      PT LONG TERM GOAL #2   Title  Pt will report atleast 50% improvement in her ability to fasten her bra when getting dressed.    Time  6    Period  Weeks    Status  New      PT LONG TERM GOAL #3   Title  Pt will have atleast 4/5 MMT strength of the Lt UE with or without pain, which will improve her ability to complete self care and other daily activity with less difficulty.    Time  6    Period  Weeks    Status  New            Plan - 05/28/19 1442    Clinical Impression Statement  Pt in signif pain arriving with 7/10 pain.  She has been away from PT since initial visit x 1 month due to sickness.  She presents with signif forward head, rounded shoulders and lack of A/ROM of bil UEs above 90 deg due to severe pain.  Pain is now bil across neck and upper back and shoulders into pectoral and axillary region.  She gets symptoms in hands including a feeling of swelling and numbness.  Session focused today on achieving AA/ROM which PT explored in a variety of ways and found Pt was able to achieve full shoulder flexion bil with use of pulleys in sitting.  PT encouraged her to purchase online if able.  PT also performed postural cueing to reduce hiking with upper traps and strain on neck and upper back during supine ther ex.  Pt was able to perform seated neck rotation with greater range and ease with cueing of neck retraction maintained throughout motion.  She will need ongoing assessment of response to treatment moving  forward due to high pain levels and signif guarding.    Rehab Potential  Fair    PT Frequency  2x / week    PT Duration  6 weeks    PT Treatment/Interventions  ADLs/Self Care Home  Management;Cryotherapy;Electrical Stimulation;Moist Heat;Therapeutic activities;Therapeutic exercise;Neuromuscular re-education;Manual techniques;Dry needling;Passive range of motion;Patient/family education;Taping    PT Next Visit Plan  f/u on HEP updates, shoulder pulleys, try gentle pec stretch, Add cervical isom in supine and shoulder isom, Try TENS/ice or heat, did Pt get shoulder pulleys?    PT Home Exercise Plan  [email or web address]    Consulted and Agree with Plan of Care  Patient       Patient will benefit from skilled therapeutic intervention in order to improve the following deficits and impairments:     Visit Diagnosis: Left shoulder pain, unspecified chronicity  Muscle weakness (generalized)  Cervicalgia  Radiculopathy, cervical region     Problem List Patient Active Problem List   Diagnosis Date Noted  . Thyrotoxicosis 12/14/2016  . Murmur, heart 08/10/2014  . Blood pressure elevated without history of HTN 08/10/2014    Hessie Varone, PT 05/28/19 5:19 PM  PHYSICAL THERAPY DISCHARGE SUMMARY  Visits from Start of Care: 2  Current functional level related to goals / functional outcomes: See above.  Pt was non-compliant with attendance for appointments.  She will need a new PT order should she wish to return for more treatment.   Remaining deficits: See above   Education / Equipment: HEP  Plan: Patient agrees to discharge.  Patient goals were not met. Patient is being discharged due to not returning since the last visit.  ?????         Baruch Merl, PT 06/18/19 1:51 PM   [facility] [address], [place], [place], [address] Phone: [phone]   Fax:  [phone]  Name: MAKESHA BELITZ MRN: [id number] Date of Birth: [birth year]

## 2019-06-02 ENCOUNTER — Other Ambulatory Visit: Payer: Self-pay

## 2019-06-02 ENCOUNTER — Ambulatory Visit: Payer: BC Managed Care – PPO | Admitting: Podiatry

## 2019-06-02 ENCOUNTER — Ambulatory Visit (INDEPENDENT_AMBULATORY_CARE_PROVIDER_SITE_OTHER): Payer: BC Managed Care – PPO

## 2019-06-02 VITALS — BP 142/95 | HR 101 | Resp 16

## 2019-06-02 DIAGNOSIS — M7742 Metatarsalgia, left foot: Secondary | ICD-10-CM

## 2019-06-02 DIAGNOSIS — M2042 Other hammer toe(s) (acquired), left foot: Secondary | ICD-10-CM

## 2019-06-02 DIAGNOSIS — M7741 Metatarsalgia, right foot: Secondary | ICD-10-CM | POA: Diagnosis not present

## 2019-06-02 DIAGNOSIS — Q828 Other specified congenital malformations of skin: Secondary | ICD-10-CM | POA: Diagnosis not present

## 2019-06-02 DIAGNOSIS — L608 Other nail disorders: Secondary | ICD-10-CM | POA: Diagnosis not present

## 2019-06-02 DIAGNOSIS — M778 Other enthesopathies, not elsewhere classified: Secondary | ICD-10-CM | POA: Diagnosis not present

## 2019-06-02 DIAGNOSIS — M2041 Other hammer toe(s) (acquired), right foot: Secondary | ICD-10-CM

## 2019-06-02 DIAGNOSIS — L603 Nail dystrophy: Secondary | ICD-10-CM

## 2019-06-02 DIAGNOSIS — M549 Dorsalgia, unspecified: Secondary | ICD-10-CM | POA: Insufficient documentation

## 2019-06-02 DIAGNOSIS — M24573 Contracture, unspecified ankle: Secondary | ICD-10-CM

## 2019-06-02 NOTE — Progress Notes (Signed)
Subjective:  Patient ID: Anne [name], female    DOB: 11/28/1980,  MRN: [id number] HPI Chief Complaint  Patient presents with  . Foot Pain    Plantar forefoot bilateral - multiple callused areas x several years, seen a podiatrist in the past-shaved and injected, she did not return to him due to injections "they were painful"  . New Patient (Initial Visit)    39 y.o. female presents with the above complaint.   ROS: Denies fever chills nausea vomiting muscle aches pains calf pain back pain chest pain shortness of breath.  Past Medical History:  Diagnosis Date  . Abnormal echocardiogram    a. 11/2016 - increased LVOT gradient.  . Asthma   . Mild mitral regurgitation   . Normocytic anemia   . Obesity   . Thyrotoxicosis 11/2014  . Tobacco abuse   . Tricuspid regurgitation    Past Surgical History:  Procedure Laterality Date  . ECTOPIC PREGNANCY SURGERY    . MANDIBLE SURGERY    . TUBAL LIGATION      Current Outpatient Medications:  .  OMEPRAZOLE PO, Take by mouth., Disp: , Rfl:  .  amLODipine (NORVASC) 5 MG tablet, , Disp: , Rfl:  .  cyclobenzaprine (FLEXERIL) 5 MG tablet, Take 5 mg by mouth at bedtime., Disp: , Rfl: 0 .  hydrochlorothiazide (HYDRODIURIL) 12.5 MG tablet, , Disp: , Rfl:  .  ibuprofen (ADVIL,MOTRIN) 600 MG tablet, TAKE 1 TABLET BY MOUTH EVERY 8 HOURS AS NEEDED FOR HEADACHE. TAKE WITH FOOD, Disp: 30 tablet, Rfl: 0 .  methimazole (TAPAZOLE) 10 MG tablet, Take 6 tablets (60 mg total) by mouth daily., Disp: 120 tablet, Rfl: 0 .  naproxen (NAPROSYN) 500 MG tablet, Take 500 mg by mouth 2 (two) times daily., Disp: , Rfl:   Allergies  Allergen Reactions  . Food Allergy Formula Anaphylaxis and Swelling    coconut  . Shellfish Allergy Swelling   Review of Systems Objective:   Vitals:   06/02/19 1514  BP: (!) 142/95  Pulse: (!) 101  Resp: 16    General: Well developed, nourished, in no acute distress, alert and oriented x3   Dermatological: Skin is warm,  dry and supple bilateral. Nails x 10 are well maintained; remaining integument appears unremarkable at this time. There are no open sores, no preulcerative lesions, no rash or signs of infection present.  Vascular: Dorsalis Pedis artery and Posterior Tibial artery pedal pulses are 2/4 bilateral with immedate capillary fill time. Pedal hair growth present. No varicosities and no lower extremity edema present bilateral.   Neruologic: Grossly intact via light touch bilateral. Vibratory intact via tuning fork bilateral. Protective threshold with Semmes Wienstein monofilament intact to all pedal sites bilateral. Patellar and Achilles deep tendon reflexes 2+ bilateral. No Babinski or clonus noted bilateral.   Musculoskeletal: No gross boney pedal deformities bilateral. No pain, crepitus, or limitation noted with foot and ankle range of motion bilateral. Muscular strength 5/5 in all groups tested bilateral.  Gait: Unassisted, Nonantalgic.    Radiographs:  None taken  Assessment & Plan:   Assessment: Ankle equinus resulting in forefoot pathology.  Pes planus associated with gastrosoleus equinus.  Porokeratosis forefoot bilateral hammertoe forefoot bilateral.  Toenails are thick yellow dystrophic clinically mycotic.  Painful corn dorsal aspect PIPJ fourth digit right foot  Plan: Debridement of all reactive hyperkeratotic tissue for her today debridement of toenails with samples to be sent for pathologic evaluation we will follow-up with her when her pathology comes in.  I  did take her to [facility] to see about getting some orthotics made.     Caitlyn Buchanan T. [place], [place]

## 2019-06-03 ENCOUNTER — Telehealth: Payer: Self-pay | Admitting: Physical Therapy

## 2019-06-03 ENCOUNTER — Ambulatory Visit: Payer: BC Managed Care – PPO | Admitting: Physical Therapy

## 2019-06-03 NOTE — Telephone Encounter (Signed)
LM for Pt to call back.  Pt no-showed for PT visit 06/03/19 at 10:15am. Morton Peters, PT 06/03/19 10:57 AM

## 2019-06-05 ENCOUNTER — Ambulatory Visit: Payer: BC Managed Care – PPO | Admitting: Physical Therapy

## 2019-06-10 ENCOUNTER — Ambulatory Visit: Payer: BC Managed Care – PPO | Admitting: Physical Therapy

## 2019-06-10 ENCOUNTER — Telehealth: Payer: Self-pay | Admitting: Physical Therapy

## 2019-06-10 NOTE — Telephone Encounter (Signed)
No show.  PT attempted to call pt but unable to leave voicemail because mailbox is full. Pt has had multiple cancellations and this will be her 3rd consecutive no show. Due to our office cancellation policy, she is being discharged from PT.    10:32 AM,06/10/19 Anne [name] PT, DPT [facility]  [phone]

## 2019-06-12 ENCOUNTER — Ambulatory Visit: Payer: BC Managed Care – PPO | Admitting: Physical Therapy

## 2019-06-26 ENCOUNTER — Other Ambulatory Visit: Payer: Self-pay

## 2019-06-26 ENCOUNTER — Encounter: Payer: BC Managed Care – PPO | Admitting: Orthotics

## 2019-06-30 ENCOUNTER — Telehealth: Payer: Self-pay | Admitting: *Deleted

## 2019-06-30 ENCOUNTER — Telehealth: Payer: Self-pay

## 2019-06-30 NOTE — Telephone Encounter (Signed)
Left message requesting pt call for results. 

## 2019-06-30 NOTE — Telephone Encounter (Signed)
I informed pt of Dr. Hyatt's review of results and orders. 

## 2019-06-30 NOTE — Telephone Encounter (Signed)
This message answered in Result Note message. 

## 2019-06-30 NOTE — Telephone Encounter (Signed)
-----   Message from Elinor Parkinson, [place] sent at 06/30/2019  7:43 AM EST ----- NEGATIVE for fungus. She can cancel her follow up if that is the only reason she is coming in.  Thanks

## 2019-06-30 NOTE — Telephone Encounter (Signed)
Pt is returning a missed call for Results

## 2019-10-29 DIAGNOSIS — E049 Nontoxic goiter, unspecified: Secondary | ICD-10-CM | POA: Diagnosis not present

## 2019-10-29 DIAGNOSIS — Z720 Tobacco use: Secondary | ICD-10-CM | POA: Diagnosis not present

## 2019-10-29 DIAGNOSIS — E0500 Thyrotoxicosis with diffuse goiter without thyrotoxic crisis or storm: Secondary | ICD-10-CM | POA: Diagnosis not present

## 2019-11-02 DIAGNOSIS — Z01419 Encounter for gynecological examination (general) (routine) without abnormal findings: Secondary | ICD-10-CM | POA: Diagnosis not present

## 2019-11-02 DIAGNOSIS — Z113 Encounter for screening for infections with a predominantly sexual mode of transmission: Secondary | ICD-10-CM | POA: Diagnosis not present

## 2019-11-02 DIAGNOSIS — R350 Frequency of micturition: Secondary | ICD-10-CM | POA: Diagnosis not present

## 2019-12-22 DIAGNOSIS — Z20822 Contact with and (suspected) exposure to covid-19: Secondary | ICD-10-CM | POA: Diagnosis not present

## 2019-12-22 DIAGNOSIS — G5600 Carpal tunnel syndrome, unspecified upper limb: Secondary | ICD-10-CM | POA: Diagnosis not present

## 2019-12-23 DIAGNOSIS — Z20822 Contact with and (suspected) exposure to covid-19: Secondary | ICD-10-CM | POA: Diagnosis not present

## 2020-02-25 DIAGNOSIS — M25561 Pain in right knee: Secondary | ICD-10-CM | POA: Diagnosis not present

## 2020-02-25 DIAGNOSIS — M25562 Pain in left knee: Secondary | ICD-10-CM | POA: Diagnosis not present

## 2020-02-25 DIAGNOSIS — R030 Elevated blood-pressure reading, without diagnosis of hypertension: Secondary | ICD-10-CM | POA: Diagnosis not present

## 2020-02-25 DIAGNOSIS — M25569 Pain in unspecified knee: Secondary | ICD-10-CM | POA: Diagnosis not present

## 2020-03-01 DIAGNOSIS — Z03818 Encounter for observation for suspected exposure to other biological agents ruled out: Secondary | ICD-10-CM | POA: Diagnosis not present

## 2020-03-22 DIAGNOSIS — Z03818 Encounter for observation for suspected exposure to other biological agents ruled out: Secondary | ICD-10-CM | POA: Diagnosis not present

## 2020-03-23 DIAGNOSIS — M25562 Pain in left knee: Secondary | ICD-10-CM | POA: Diagnosis not present

## 2020-03-29 DIAGNOSIS — Z720 Tobacco use: Secondary | ICD-10-CM | POA: Diagnosis not present

## 2020-03-29 DIAGNOSIS — E0500 Thyrotoxicosis with diffuse goiter without thyrotoxic crisis or storm: Secondary | ICD-10-CM | POA: Diagnosis not present

## 2020-03-29 DIAGNOSIS — E049 Nontoxic goiter, unspecified: Secondary | ICD-10-CM | POA: Diagnosis not present

## 2020-03-29 DIAGNOSIS — M1990 Unspecified osteoarthritis, unspecified site: Secondary | ICD-10-CM | POA: Diagnosis not present

## 2020-07-04 DIAGNOSIS — M25562 Pain in left knee: Secondary | ICD-10-CM | POA: Diagnosis not present

## 2020-07-04 DIAGNOSIS — M25561 Pain in right knee: Secondary | ICD-10-CM | POA: Diagnosis not present

## 2020-07-04 DIAGNOSIS — I10 Essential (primary) hypertension: Secondary | ICD-10-CM | POA: Diagnosis not present

## 2020-07-04 DIAGNOSIS — G8929 Other chronic pain: Secondary | ICD-10-CM | POA: Diagnosis not present

## 2020-08-31 ENCOUNTER — Emergency Department (HOSPITAL_BASED_OUTPATIENT_CLINIC_OR_DEPARTMENT_OTHER)
Admission: EM | Admit: 2020-08-31 | Discharge: 2020-08-31 | Disposition: A | Discharge status: Home / Self Care | Payer: BC Managed Care – PPO | Attending: Emergency Medicine | Admitting: Emergency Medicine

## 2020-08-31 ENCOUNTER — Other Ambulatory Visit: Payer: Self-pay

## 2020-08-31 ENCOUNTER — Encounter (HOSPITAL_BASED_OUTPATIENT_CLINIC_OR_DEPARTMENT_OTHER): Payer: Self-pay | Admitting: Emergency Medicine

## 2020-08-31 ENCOUNTER — Emergency Department (HOSPITAL_BASED_OUTPATIENT_CLINIC_OR_DEPARTMENT_OTHER): Payer: BC Managed Care – PPO

## 2020-08-31 DIAGNOSIS — J45909 Unspecified asthma, uncomplicated: Secondary | ICD-10-CM | POA: Diagnosis not present

## 2020-08-31 DIAGNOSIS — R0789 Other chest pain: Secondary | ICD-10-CM | POA: Diagnosis not present

## 2020-08-31 DIAGNOSIS — R079 Chest pain, unspecified: Secondary | ICD-10-CM

## 2020-08-31 DIAGNOSIS — Z20822 Contact with and (suspected) exposure to covid-19: Secondary | ICD-10-CM | POA: Diagnosis not present

## 2020-08-31 DIAGNOSIS — R059 Cough, unspecified: Secondary | ICD-10-CM | POA: Diagnosis not present

## 2020-08-31 DIAGNOSIS — F17210 Nicotine dependence, cigarettes, uncomplicated: Secondary | ICD-10-CM | POA: Diagnosis not present

## 2020-08-31 LAB — CBC WITH DIFFERENTIAL/PLATELET
Abs Immature Granulocytes: 0.03 10*3/uL (ref 0.00–0.07)
Basophils Absolute: 0 10*3/uL (ref 0.0–0.1)
Basophils Relative: 0 %
Eosinophils Absolute: 0.2 10*3/uL (ref 0.0–0.5)
Eosinophils Relative: 2 %
HCT: 36.6 % (ref 36.0–46.0)
Hemoglobin: 11.9 g/dL — ABNORMAL LOW (ref 12.0–15.0)
Immature Granulocytes: 0 %
Lymphocytes Relative: 26 %
Lymphs Abs: 2.7 10*3/uL (ref 0.7–4.0)
MCH: 29.7 pg (ref 26.0–34.0)
MCHC: 32.5 g/dL (ref 30.0–36.0)
MCV: 91.3 fL (ref 80.0–100.0)
Monocytes Absolute: 0.7 10*3/uL (ref 0.1–1.0)
Monocytes Relative: 7 %
Neutro Abs: 6.5 10*3/uL (ref 1.7–7.7)
Neutrophils Relative %: 65 %
Platelets: 226 10*3/uL (ref 150–400)
RBC: 4.01 MIL/uL (ref 3.87–5.11)
RDW: 12.4 % (ref 11.5–15.5)
WBC: 10 10*3/uL (ref 4.0–10.5)
nRBC: 0 % (ref 0.0–0.2)

## 2020-08-31 LAB — BASIC METABOLIC PANEL
Anion gap: 7 (ref 5–15)
BUN: 12 mg/dL (ref 6–20)
CO2: 25 mmol/L (ref 22–32)
Calcium: 8.5 mg/dL — ABNORMAL LOW (ref 8.9–10.3)
Chloride: 110 mmol/L (ref 98–111)
Creatinine, Ser: 0.63 mg/dL (ref 0.44–1.00)
GFR, Estimated: >60 mL/min (ref >60)
Glucose, Bld: 87 mg/dL (ref 70–99)
Potassium: 3.9 mmol/L (ref 3.5–5.1)
Sodium: 142 mmol/L (ref 135–145)

## 2020-08-31 LAB — TROPONIN I (HIGH SENSITIVITY)
Troponin I (High Sensitivity): 6 ng/L (ref <18)
Troponin I (High Sensitivity): 6 ng/L (ref <18)

## 2020-08-31 LAB — PREGNANCY, URINE: Preg Test, Ur: NEGATIVE

## 2020-08-31 MED ORDER — ACETAMINOPHEN 325 MG PO TABS
650.0000 mg | ORAL_TABLET | Freq: Once | ORAL | Status: AC
  Administered 2020-08-31: 650 mg via ORAL
  Filled 2020-08-31: qty 2

## 2020-08-31 NOTE — ED Notes (Signed)
ED Provider at bedside. 

## 2020-08-31 NOTE — ED Provider Notes (Signed)
[facility] EMERGENCY DEPT Provider Note   CSN: [id number] Arrival date & time: 08/31/20  1711     History Chief Complaint  Patient presents with  . Chest Pain    Anne [name] is a 40 y.o. female.  Presents ER chief complaint of swelling in her feet and hands on and off for 2 weeks.  Also complaining of chest tightness for 2 days.  Describes as an ache in the mid chest worse when she pushes on it.  Denies fevers or cough no vomiting or diarrhea.  Pain does not radiate elsewhere.  Denies shortness of breath or diaphoresis.        Past Medical History:  Diagnosis Date  . Abnormal echocardiogram    a. 11/2016 - increased LVOT gradient.  . Asthma   . Mild mitral regurgitation   . Normocytic anemia   . Obesity   . Thyrotoxicosis 11/2014  . Tobacco abuse   . Tricuspid regurgitation     Patient Active Problem List   Diagnosis Date Noted  . Backache 06/02/2019  . Thyrotoxicosis 12/14/2016  . Genital herpes simplex 05/23/2016  . Murmur, heart 08/10/2014  . Blood pressure elevated without history of HTN 08/10/2014    Past Surgical History:  Procedure Laterality Date  . ECTOPIC PREGNANCY SURGERY    . MANDIBLE SURGERY    . TUBAL LIGATION       OB History    Gravida  1   Para      Term      Preterm      AB      Living        SAB      IAB      Ectopic      Multiple      Live Births              Family History  Problem Relation Age of Onset  . Hypertension Father   . Thyroid disease Mother   . Diabetes Maternal Grandmother   . Thyroid disease Maternal Grandmother   . Diabetes Maternal Aunt   . Diabetes Maternal Aunt   . Kidney disease Maternal Aunt     Social History   Tobacco Use  . Smoking status: Current Every Day Smoker    Packs/day: 1.00    Years: 10.00    Pack years: 10.00    Types: Cigarettes, Cigars  . Smokeless tobacco: Never Used  Substance Use Topics  . Alcohol use: Yes    Alcohol/week: 1.0 standard  drink    Types: 1 Glasses of wine per week    Comment: occasion - 1 glass of wine per week  . Drug use: No    Home Medications Prior to Admission medications   Medication Sig Start Date End Date Taking? Authorizing Provider  amLODipine (NORVASC) 5 MG tablet  12/23/18   [provider]  cyclobenzaprine (FLEXERIL) 5 MG tablet Take 5 mg by mouth at bedtime. 12/26/16   [provider]  hydrochlorothiazide (HYDRODIURIL) 12.5 MG tablet  12/23/18   [provider]  ibuprofen (ADVIL,MOTRIN) 600 MG tablet TAKE 1 TABLET BY MOUTH EVERY 8 HOURS AS NEEDED FOR HEADACHE. TAKE WITH FOOD 01/23/17   Lizbeth Bark, FNP  methimazole (TAPAZOLE) 10 MG tablet Take 6 tablets (60 mg total) by mouth daily. 12/17/16   Zannie Cove, MD  naproxen (NAPROSYN) 500 MG tablet Take 500 mg by mouth 2 (two) times daily. 04/15/19   [provider]  OMEPRAZOLE PO Take  by mouth.    [provider]    Allergies    Food allergy formula and Shellfish allergy  Review of Systems   Review of Systems  Constitutional: Negative for fever.  HENT: Negative for ear pain.   Eyes: Negative for pain.  Respiratory: Negative for cough.   Cardiovascular: Positive for chest pain.  Gastrointestinal: Negative for abdominal pain.  Genitourinary: Negative for flank pain.  Musculoskeletal: Negative for back pain.  Skin: Negative for rash.  Neurological: Negative for headaches.    Physical Exam Updated Vital Signs BP (!) 178/105   Pulse 83   Temp 98.7 F (37.1 C) (Oral)   Resp 19   LMP 08/23/2020   SpO2 100%   Physical Exam Constitutional:      General: She is not in acute distress.    Appearance: Normal appearance.  HENT:     Head: Normocephalic.     Nose: Nose normal.  Eyes:     Extraocular Movements: Extraocular movements intact.  Cardiovascular:     Rate and Rhythm: Normal rate.  Pulmonary:     Effort: Pulmonary effort is normal.  Chest:     Chest wall: Tenderness  present. No edema.     Comments: Tenderness reproduced on palpation of the chest. Musculoskeletal:        General: Normal range of motion.     Cervical back: Normal range of motion.  Neurological:     General: No focal deficit present.     Mental Status: She is alert. Mental status is at baseline.     ED Results / Procedures / Treatments   Labs (all labs ordered are listed, but only abnormal results are displayed) Labs Reviewed  CBC WITH DIFFERENTIAL/PLATELET - Abnormal; Notable for the following components:      Result Value   Hemoglobin 11.9 (*)    All other components within normal limits  BASIC METABOLIC PANEL - Abnormal; Notable for the following components:   Calcium 8.5 (*)    All other components within normal limits  SARS CORONAVIRUS 2 (TAT 6-24 HRS)  PREGNANCY, URINE  TROPONIN I (HIGH SENSITIVITY)  TROPONIN I (HIGH SENSITIVITY)    EKG EKG Interpretation  Date/Time:  Wednesday Aug 31 2020 17:22:39 EDT Ventricular Rate:  86 PR Interval:  111 QRS Duration: 86 QT Interval:  390 QTC Calculation: 467 R Axis:   55 Text Interpretation: Sinus rhythm Borderline short PR interval Consider left ventricular hypertrophy Confirmed by Norman Clay ([id number]) on 08/31/2020 5:51:12 PM   Radiology DG Chest Port 1 View  Result Date: 08/31/2020 CLINICAL DATA:  Chest pain and cough for 2 days EXAM: PORTABLE CHEST 1 VIEW COMPARISON:  05/20/2019 FINDINGS: Cardiac shadow is within normal limits. The lungs are well aerated bilaterally. No focal infiltrate or sizable effusion is seen. No bony abnormality is seen. IMPRESSION: No acute abnormality noted. Electronically Signed   By: Alcide Clever M.D.   On: 08/31/2020 18:34    Procedures Procedures   Medications Ordered in ED Medications  acetaminophen (TYLENOL) tablet 650 mg (650 mg Oral Given 08/31/20 1837)    ED Course  I have reviewed the triage vital signs and the nursing notes.  Pertinent labs & imaging results that were  available during my care of the patient were reviewed by me and considered in my medical decision making (see chart for details).    MDM Rules/Calculators/A&P  No significant swelling of the hands or feet seen on exam today.  Patient states they are better than they have been in the last 2 weeks.  Chest pain is atypical with reproduced with palpation of the chest.  EKG shows sinus rhythm no ST elevations or elevations or depressions noted.  Initial troponin is negative second troponin continues to be negative.  I doubt acute coronary syndrome, advising outpatient follow-up with her doctor within the week.  Advised immediate return for worsening pain fevers or any additional concerns.  Final Clinical Impression(s) / ED Diagnoses Final diagnoses:  Chest pain, unspecified type    Rx / DC Orders ED Discharge Orders    None       Cheryll Cockayne, MD 08/31/20 2036

## 2020-08-31 NOTE — ED Triage Notes (Addendum)
States has been having swelling to hands and feet x 2 weeks, chest pain since at times since yesterday, also swelling to neck as well. Hx of Graves Disease, has not been taking meds, due to side effects

## 2020-08-31 NOTE — Discharge Instructions (Addendum)
Call your primary care doctor or specialist as discussed in the next 2-3 days.   Return immediately back to the ER if:  Your symptoms worsen within the next 12-24 hours. You develop new symptoms such as new fevers, persistent vomiting, new pain, shortness of breath, or new weakness or numbness, or if you have any other concerns.  

## 2020-09-01 LAB — SARS CORONAVIRUS 2 (TAT 6-24 HRS): SARS Coronavirus 2: NEGATIVE

## 2020-09-05 DIAGNOSIS — E0500 Thyrotoxicosis with diffuse goiter without thyrotoxic crisis or storm: Secondary | ICD-10-CM | POA: Diagnosis not present

## 2020-09-05 DIAGNOSIS — E049 Nontoxic goiter, unspecified: Secondary | ICD-10-CM | POA: Diagnosis not present

## 2020-09-06 DIAGNOSIS — E0500 Thyrotoxicosis with diffuse goiter without thyrotoxic crisis or storm: Secondary | ICD-10-CM | POA: Diagnosis not present

## 2020-09-08 DIAGNOSIS — E0590 Thyrotoxicosis, unspecified without thyrotoxic crisis or storm: Secondary | ICD-10-CM | POA: Diagnosis not present

## 2020-09-08 DIAGNOSIS — R000 Tachycardia, unspecified: Secondary | ICD-10-CM | POA: Diagnosis not present

## 2020-09-08 DIAGNOSIS — E0500 Thyrotoxicosis with diffuse goiter without thyrotoxic crisis or storm: Secondary | ICD-10-CM | POA: Diagnosis not present

## 2020-09-22 DIAGNOSIS — E0590 Thyrotoxicosis, unspecified without thyrotoxic crisis or storm: Secondary | ICD-10-CM | POA: Diagnosis not present

## 2020-09-22 DIAGNOSIS — Z720 Tobacco use: Secondary | ICD-10-CM | POA: Diagnosis not present

## 2020-09-22 DIAGNOSIS — E0500 Thyrotoxicosis with diffuse goiter without thyrotoxic crisis or storm: Secondary | ICD-10-CM | POA: Diagnosis not present

## 2020-09-22 DIAGNOSIS — E049 Nontoxic goiter, unspecified: Secondary | ICD-10-CM | POA: Diagnosis not present

## 2020-11-29 DIAGNOSIS — R5383 Other fatigue: Secondary | ICD-10-CM | POA: Diagnosis not present

## 2020-11-29 DIAGNOSIS — R0981 Nasal congestion: Secondary | ICD-10-CM | POA: Diagnosis not present

## 2020-11-29 DIAGNOSIS — U071 COVID-19: Secondary | ICD-10-CM | POA: Diagnosis not present

## 2020-11-29 DIAGNOSIS — R059 Cough, unspecified: Secondary | ICD-10-CM | POA: Diagnosis not present

## 2020-12-16 DIAGNOSIS — E0590 Thyrotoxicosis, unspecified without thyrotoxic crisis or storm: Secondary | ICD-10-CM | POA: Diagnosis not present

## 2020-12-16 DIAGNOSIS — E0500 Thyrotoxicosis with diffuse goiter without thyrotoxic crisis or storm: Secondary | ICD-10-CM | POA: Diagnosis not present

## 2020-12-19 DIAGNOSIS — E0500 Thyrotoxicosis with diffuse goiter without thyrotoxic crisis or storm: Secondary | ICD-10-CM | POA: Diagnosis not present

## 2020-12-19 DIAGNOSIS — E0590 Thyrotoxicosis, unspecified without thyrotoxic crisis or storm: Secondary | ICD-10-CM | POA: Diagnosis not present

## 2020-12-19 DIAGNOSIS — E049 Nontoxic goiter, unspecified: Secondary | ICD-10-CM | POA: Diagnosis not present

## 2020-12-19 DIAGNOSIS — Z720 Tobacco use: Secondary | ICD-10-CM | POA: Diagnosis not present

## 2021-01-02 DIAGNOSIS — M545 Low back pain, unspecified: Secondary | ICD-10-CM | POA: Diagnosis not present

## 2021-01-02 DIAGNOSIS — Z1231 Encounter for screening mammogram for malignant neoplasm of breast: Secondary | ICD-10-CM | POA: Diagnosis not present

## 2021-01-02 DIAGNOSIS — N939 Abnormal uterine and vaginal bleeding, unspecified: Secondary | ICD-10-CM | POA: Diagnosis not present

## 2021-01-02 DIAGNOSIS — N979 Female infertility, unspecified: Secondary | ICD-10-CM | POA: Diagnosis not present

## 2021-01-02 DIAGNOSIS — Z01419 Encounter for gynecological examination (general) (routine) without abnormal findings: Secondary | ICD-10-CM | POA: Diagnosis not present

## 2021-01-11 DIAGNOSIS — N979 Female infertility, unspecified: Secondary | ICD-10-CM | POA: Diagnosis not present

## 2021-01-12 DIAGNOSIS — M545 Low back pain, unspecified: Secondary | ICD-10-CM | POA: Diagnosis not present

## 2021-06-05 DIAGNOSIS — Z03818 Encounter for observation for suspected exposure to other biological agents ruled out: Secondary | ICD-10-CM | POA: Diagnosis not present

## 2021-06-05 DIAGNOSIS — J101 Influenza due to other identified influenza virus with other respiratory manifestations: Secondary | ICD-10-CM | POA: Diagnosis not present

## 2021-06-05 DIAGNOSIS — B349 Viral infection, unspecified: Secondary | ICD-10-CM | POA: Diagnosis not present

## 2021-09-13 DIAGNOSIS — K219 Gastro-esophageal reflux disease without esophagitis: Secondary | ICD-10-CM | POA: Diagnosis not present

## 2021-09-13 DIAGNOSIS — E0590 Thyrotoxicosis, unspecified without thyrotoxic crisis or storm: Secondary | ICD-10-CM | POA: Diagnosis not present

## 2021-09-13 DIAGNOSIS — R209 Unspecified disturbances of skin sensation: Secondary | ICD-10-CM | POA: Diagnosis not present

## 2021-09-13 DIAGNOSIS — E0500 Thyrotoxicosis with diffuse goiter without thyrotoxic crisis or storm: Secondary | ICD-10-CM | POA: Diagnosis not present

## 2021-09-13 DIAGNOSIS — Z13220 Encounter for screening for lipoid disorders: Secondary | ICD-10-CM | POA: Diagnosis not present

## 2021-09-13 DIAGNOSIS — M79671 Pain in right foot: Secondary | ICD-10-CM | POA: Diagnosis not present

## 2021-09-13 DIAGNOSIS — M79672 Pain in left foot: Secondary | ICD-10-CM | POA: Diagnosis not present

## 2021-09-19 DIAGNOSIS — E0500 Thyrotoxicosis with diffuse goiter without thyrotoxic crisis or storm: Secondary | ICD-10-CM | POA: Diagnosis not present

## 2021-09-19 DIAGNOSIS — E032 Hypothyroidism due to medicaments and other exogenous substances: Secondary | ICD-10-CM | POA: Diagnosis not present

## 2021-09-19 DIAGNOSIS — E049 Nontoxic goiter, unspecified: Secondary | ICD-10-CM | POA: Diagnosis not present

## 2021-09-19 DIAGNOSIS — Z720 Tobacco use: Secondary | ICD-10-CM | POA: Diagnosis not present

## 2021-09-29 ENCOUNTER — Ambulatory Visit (INDEPENDENT_AMBULATORY_CARE_PROVIDER_SITE_OTHER): Payer: BC Managed Care – PPO | Admitting: Podiatry

## 2021-09-29 DIAGNOSIS — Z79899 Other long term (current) drug therapy: Secondary | ICD-10-CM | POA: Diagnosis not present

## 2021-09-29 DIAGNOSIS — B351 Tinea unguium: Secondary | ICD-10-CM | POA: Diagnosis not present

## 2021-10-04 ENCOUNTER — Emergency Department (HOSPITAL_BASED_OUTPATIENT_CLINIC_OR_DEPARTMENT_OTHER)
Admission: EM | Admit: 2021-10-04 | Discharge: 2021-10-04 | Discharge status: Against Medical Advice | Payer: BC Managed Care – PPO | Attending: Emergency Medicine | Admitting: Emergency Medicine

## 2021-10-04 ENCOUNTER — Encounter (HOSPITAL_BASED_OUTPATIENT_CLINIC_OR_DEPARTMENT_OTHER): Payer: Self-pay

## 2021-10-04 ENCOUNTER — Emergency Department (HOSPITAL_BASED_OUTPATIENT_CLINIC_OR_DEPARTMENT_OTHER): Payer: BC Managed Care – PPO | Admitting: Radiology

## 2021-10-04 ENCOUNTER — Other Ambulatory Visit: Payer: Self-pay

## 2021-10-04 DIAGNOSIS — R0602 Shortness of breath: Secondary | ICD-10-CM | POA: Diagnosis not present

## 2021-10-04 DIAGNOSIS — I10 Essential (primary) hypertension: Secondary | ICD-10-CM | POA: Insufficient documentation

## 2021-10-04 DIAGNOSIS — R0789 Other chest pain: Secondary | ICD-10-CM | POA: Diagnosis not present

## 2021-10-04 DIAGNOSIS — R079 Chest pain, unspecified: Secondary | ICD-10-CM | POA: Diagnosis not present

## 2021-10-04 DIAGNOSIS — Z79899 Other long term (current) drug therapy: Secondary | ICD-10-CM | POA: Diagnosis not present

## 2021-10-04 LAB — CBC
HCT: 41.6 % (ref 36.0–46.0)
Hemoglobin: 13.8 g/dL (ref 12.0–15.0)
MCH: 32.2 pg (ref 26.0–34.0)
MCHC: 33.2 g/dL (ref 30.0–36.0)
MCV: 97 fL (ref 80.0–100.0)
Platelets: 204 10*3/uL (ref 150–400)
RBC: 4.29 MIL/uL (ref 3.87–5.11)
RDW: 12.8 % (ref 11.5–15.5)
WBC: 7.7 10*3/uL (ref 4.0–10.5)
nRBC: 0 % (ref 0.0–0.2)

## 2021-10-04 LAB — BASIC METABOLIC PANEL
Anion gap: 10 (ref 5–15)
BUN: 13 mg/dL (ref 6–20)
CO2: 22 mmol/L (ref 22–32)
Calcium: 9.6 mg/dL (ref 8.9–10.3)
Chloride: 107 mmol/L (ref 98–111)
Creatinine, Ser: 0.85 mg/dL (ref 0.44–1.00)
GFR, Estimated: >60 mL/min (ref >60)
Glucose, Bld: 80 mg/dL (ref 70–99)
Potassium: 3.9 mmol/L (ref 3.5–5.1)
Sodium: 139 mmol/L (ref 135–145)

## 2021-10-04 LAB — TROPONIN I (HIGH SENSITIVITY)
Troponin I (High Sensitivity): 3 ng/L (ref <18)
Troponin I (High Sensitivity): 3 ng/L (ref <18)

## 2021-10-04 MED ORDER — LIDOCAINE VISCOUS HCL 2 % MT SOLN
15.0000 mL | Freq: Once | OROMUCOSAL | Status: AC
  Administered 2021-10-04: 15 mL via ORAL
  Filled 2021-10-04: qty 15

## 2021-10-04 MED ORDER — HYDROCHLOROTHIAZIDE 25 MG PO TABS
25.0000 mg | ORAL_TABLET | Freq: Once | ORAL | Status: AC
  Administered 2021-10-04: 25 mg via ORAL
  Filled 2021-10-04: qty 1

## 2021-10-04 MED ORDER — ALUM & MAG HYDROXIDE-SIMETH 200-200-20 MG/5ML PO SUSP
30.0000 mL | Freq: Once | ORAL | Status: AC
  Administered 2021-10-04: 30 mL via ORAL
  Filled 2021-10-04: qty 30

## 2021-10-04 NOTE — ED Triage Notes (Signed)
Present for high BP from dentist office.  States having some dizziness and chest pressure.  Midsternum chest and pain to LUQ Abdominal.  States being seen for Graves disease.  NAD  denies SOB

## 2021-10-04 NOTE — Progress Notes (Signed)
Subjective:  Patient ID: Anne [name], female    DOB: Oct 18, 1980,  MRN: [id number]  Chief Complaint  Patient presents with   Callouses    41 y.o. female presents with the above complaint.  Patient presents with complaint of thickened elongated dystrophic toenails x10.  Mild pain on palpation.  Patient states that been present for quite some time is progressive gotten worse.  She has tried some over-the-counter medication none of which has helped.  She would like to discuss treatment options for this.  She has not taken an oral medication.  She is not any liver history   Review of Systems: Negative except as noted in the HPI. Denies N/V/F/Ch.  Past Medical History:  Diagnosis Date   Abnormal echocardiogram    a. 11/2016 - increased LVOT gradient.   Asthma    Mild mitral regurgitation    Normocytic anemia    Obesity    Thyrotoxicosis 11/2014   Tobacco abuse    Tricuspid regurgitation     Current Outpatient Medications:    amLODipine (NORVASC) 5 MG tablet, , Disp: , Rfl:    cyclobenzaprine (FLEXERIL) 5 MG tablet, Take 5 mg by mouth at bedtime., Disp: , Rfl: 0   hydrochlorothiazide (HYDRODIURIL) 12.5 MG tablet, , Disp: , Rfl:    ibuprofen (ADVIL,MOTRIN) 600 MG tablet, TAKE 1 TABLET BY MOUTH EVERY 8 HOURS AS NEEDED FOR HEADACHE. TAKE WITH FOOD, Disp: 30 tablet, Rfl: 0   methimazole (TAPAZOLE) 10 MG tablet, Take 6 tablets (60 mg total) by mouth daily., Disp: 120 tablet, Rfl: 0   naproxen (NAPROSYN) 500 MG tablet, Take 500 mg by mouth 2 (two) times daily., Disp: , Rfl:    OMEPRAZOLE PO, Take by mouth., Disp: , Rfl:   Social History   Tobacco Use  Smoking Status Every Day   Packs/day: 1.00   Years: 10.00   Total pack years: 10.00   Types: Cigarettes, Cigars  Smokeless Tobacco Never    Allergies  Allergen Reactions   Food Allergy Formula Anaphylaxis and Swelling    coconut   Shellfish Allergy Swelling   Objective:  There were no vitals filed for this visit. There is  no height or weight on file to calculate BMI. Constitutional Well developed. Well nourished.  Vascular Dorsalis pedis pulses palpable bilaterally. Posterior tibial pulses palpable bilaterally. Capillary refill normal to all digits.  No cyanosis or clubbing noted. Pedal hair growth normal.  Neurologic Normal speech. Oriented to person, place, and time. Epicritic sensation to light touch grossly present bilaterally.  Dermatologic Nails thickened elongated dystrophic mycotic toenails x10.  Mild pain on palpation Skin within normal limits  Orthopedic: Normal joint ROM without pain or crepitus bilaterally. No visible deformities. No bony tenderness.   Radiographs: None Assessment:   1. Long-term use of high-risk medication   2. Nail fungus   3. Onychomycosis due to dermatophyte    Plan:  Patient was evaluated and treated and all questions answered.  Onychomycosis toenails x10 -Educated the patient on the etiology of onychomycosis and various treatment options associated with improving the fungal load.  I explained to the patient that there is 3 treatment options available to treat the onychomycosis including topical, p.o., laser treatment.  Patient elected to undergo p.o. options with Lamisil/terbinafine therapy.  In order for me to start the medication therapy, I explained to the patient the importance of evaluating the liver and obtaining the liver function test.  Once the liver function test comes back normal I will start him  on 19-month course of Lamisil therapy.  Patient understood all risk and would like to proceed with Lamisil therapy.  I have asked the patient to immediately stop the Lamisil therapy if she has any reactions to it and call the office or go to the emergency room right away.  Patient states understanding   No follow-ups on file.

## 2021-10-04 NOTE — ED Notes (Signed)
Patient not currently in Examination Room for EDP Reassessment and/or VS Reassessment. Patient not visualized exiting Department but had previously mentioned possible leaving without Reassessment. Patient to be discharged accordingly.

## 2021-10-05 NOTE — ED Provider Notes (Signed)
[facility] EMERGENCY DEPT Provider Note   CSN: [id number] Arrival date & time: 10/04/21  1123     History  Chief Complaint  Patient presents with   Chest Pain    Anne [name] is a 41 y.o. female.  Reports she was sent from her dentist office where her blood pressure was high patient states she had some discomfort in her chest.  Patient reports she has a past medical history of Graves' disease.  Patient denies missing any of her medications.  Patient denies fever or chills she has not had any cough she has not had any congestion  The history is provided by the patient. No language interpreter was used.  Chest Pain Pain location:  L chest Pain quality: aching   Pain radiates to:  Does not radiate Pain severity:  Moderate Onset quality:  Gradual Progression:  Worsening Chronicity:  New Context: not breathing   Relieved by:  Nothing Worsened by:  Nothing Ineffective treatments:  None tried Associated symptoms: no vomiting   Risk factors: hypertension        Home Medications Prior to Admission medications   Medication Sig Start Date End Date Taking? Authorizing Provider  amLODipine (NORVASC) 5 MG tablet  12/23/18   [provider]  cyclobenzaprine (FLEXERIL) 5 MG tablet Take 5 mg by mouth at bedtime. 12/26/16   [provider]  hydrochlorothiazide (HYDRODIURIL) 12.5 MG tablet  12/23/18   [provider]  ibuprofen (ADVIL,MOTRIN) 600 MG tablet TAKE 1 TABLET BY MOUTH EVERY 8 HOURS AS NEEDED FOR HEADACHE. TAKE WITH FOOD 01/23/17   Alfonse Spruce, FNP  methimazole (TAPAZOLE) 10 MG tablet Take 6 tablets (60 mg total) by mouth daily. 12/17/16   Domenic Polite, MD  naproxen (NAPROSYN) 500 MG tablet Take 500 mg by mouth 2 (two) times daily. 04/15/19   [provider]  OMEPRAZOLE PO Take by mouth.    [provider]      Allergies    Food allergy formula and Shellfish allergy    Review of Systems   Review of  Systems  Cardiovascular:  Positive for chest pain.  Gastrointestinal:  Negative for vomiting.  All other systems reviewed and are negative.   Physical Exam Updated Vital Signs BP (!) 169/97 (BP Location: Right Arm)   Pulse 62   Temp 98.6 F (37 C)   Resp 10   Ht _0  (1.778 m)   Wt 111.1 kg   LMP 09/23/2021   SpO2 100%   BMI 35.15 kg/m  Physical Exam Vitals and nursing note reviewed.  Constitutional:      Appearance: She is well-developed.  HENT:     Head: Normocephalic.  Cardiovascular:     Rate and Rhythm: Normal rate and regular rhythm.     Heart sounds: Normal heart sounds.  Pulmonary:     Effort: Pulmonary effort is normal.  Abdominal:     General: There is no distension.     Palpations: Abdomen is soft.  Musculoskeletal:        General: Normal range of motion.     Cervical back: Normal range of motion.  Neurological:     Mental Status: She is alert and oriented to person, place, and time.     ED Results / Procedures / Treatments   Labs (all labs ordered are listed, but only abnormal results are displayed) Labs Reviewed  BASIC METABOLIC PANEL  CBC  TROPONIN I (HIGH SENSITIVITY)  TROPONIN I (HIGH SENSITIVITY)  EKG EKG Interpretation  Date/Time:  Wednesday October 04 2021 11:39:10 EDT Ventricular Rate:  76 PR Interval:  118 QRS Duration: 76 QT Interval:  370 QTC Calculation: 416 R Axis:   65 Text Interpretation: Normal sinus rhythm Normal ECG When compared with ECG of 31-Aug-2020 17:22, PREVIOUS ECG IS PRESENT Confirmed by Octaviano Glow [phone]) on 10/04/2021 3:43:39 PM  Radiology DG Chest 2 View  Result Date: 10/04/2021 CLINICAL DATA:  Chest pain.  Hypertension.  Shortness of breath. EXAM: CHEST - 2 VIEW COMPARISON:  08/31/2020 FINDINGS: Midline trachea. Normal heart size and mediastinal contours. No pleural effusion or pneumothorax. Clear lungs. IMPRESSION: No active cardiopulmonary disease. Electronically Signed   By: Abigail Miyamoto M.D.   On:  10/04/2021 12:12    Procedures Procedures    Medications Ordered in ED Medications  alum & mag hydroxide-simeth (MAALOX/MYLANTA) 200-200-20 MG/5ML suspension 30 mL (30 mLs Oral Given 10/04/21 1604)    And  lidocaine (XYLOCAINE) 2 % viscous mouth solution 15 mL (15 mLs Oral Given 10/04/21 1605)  hydrochlorothiazide (HYDRODIURIL) tablet 25 mg (25 mg Oral Given 10/04/21 1604)    ED Course/ Medical Decision Making/ A&P                           Medical Decision Making Pt complains of jhigh blood pressure  Amount and/or Complexity of Data Reviewed Labs: ordered. Decision-making details documented in ED Course.    Details: Labs ordered reviewed and interpreted be met are normal opponent is negative Radiology: ordered and independent interpretation performed. Decision-making details documented in ED Course.    Details: Chest x-ray is read by the radiologist and is normal ECG/medicine tests: ordered and independent interpretation performed. Decision-making details documented in ED Course.    Details: EKG shows no acute abnormality  Risk OTC drugs. Risk Details: Patient's laboratory evaluation EKG and chest x-ray are reassuring.  Patient is hypertensive.            Final Clinical Impression(s) / ED Diagnoses Final diagnoses:  Hypertension, unspecified type    Rx / DC Orders ED Discharge Orders     None      Patient left before reevaluation and discussion of medication management   Sidney Ace 10/05/21 2033    Wyvonnia Dusky, MD 10/05/21 [phone]

## 2021-10-09 ENCOUNTER — Other Ambulatory Visit: Payer: Self-pay | Admitting: Family Medicine

## 2021-10-09 DIAGNOSIS — Z841 Family history of disorders of kidney and ureter: Secondary | ICD-10-CM

## 2021-10-09 DIAGNOSIS — I10 Essential (primary) hypertension: Secondary | ICD-10-CM | POA: Diagnosis not present

## 2021-10-10 DIAGNOSIS — E049 Nontoxic goiter, unspecified: Secondary | ICD-10-CM | POA: Diagnosis not present

## 2021-10-10 DIAGNOSIS — E032 Hypothyroidism due to medicaments and other exogenous substances: Secondary | ICD-10-CM | POA: Diagnosis not present

## 2021-10-10 DIAGNOSIS — E0500 Thyrotoxicosis with diffuse goiter without thyrotoxic crisis or storm: Secondary | ICD-10-CM | POA: Diagnosis not present

## 2021-10-13 DIAGNOSIS — E049 Nontoxic goiter, unspecified: Secondary | ICD-10-CM | POA: Diagnosis not present

## 2021-10-13 DIAGNOSIS — E0500 Thyrotoxicosis with diffuse goiter without thyrotoxic crisis or storm: Secondary | ICD-10-CM | POA: Diagnosis not present

## 2021-10-16 ENCOUNTER — Other Ambulatory Visit: Payer: BC Managed Care – PPO

## 2021-10-16 DIAGNOSIS — Z720 Tobacco use: Secondary | ICD-10-CM | POA: Diagnosis not present

## 2021-10-16 DIAGNOSIS — E0500 Thyrotoxicosis with diffuse goiter without thyrotoxic crisis or storm: Secondary | ICD-10-CM | POA: Diagnosis not present

## 2021-10-16 DIAGNOSIS — I10 Essential (primary) hypertension: Secondary | ICD-10-CM | POA: Diagnosis not present

## 2021-10-16 DIAGNOSIS — E049 Nontoxic goiter, unspecified: Secondary | ICD-10-CM | POA: Diagnosis not present

## 2021-10-23 ENCOUNTER — Ambulatory Visit: Payer: Self-pay | Admitting: Surgery

## 2021-10-23 DIAGNOSIS — E0500 Thyrotoxicosis with diffuse goiter without thyrotoxic crisis or storm: Secondary | ICD-10-CM | POA: Diagnosis not present

## 2021-10-23 DIAGNOSIS — E049 Nontoxic goiter, unspecified: Secondary | ICD-10-CM | POA: Insufficient documentation

## 2021-10-23 DIAGNOSIS — E0590 Thyrotoxicosis, unspecified without thyrotoxic crisis or storm: Secondary | ICD-10-CM | POA: Diagnosis not present

## 2022-02-21 ENCOUNTER — Telehealth: Payer: Self-pay | Admitting: *Deleted

## 2022-02-21 NOTE — Patient Outreach (Signed)
  Care Coordination   02/21/2022 Name: PARISS HOMMES MRN: [id number] DOB: 05-03-1980   Care Coordination Outreach Attempts:  An unsuccessful telephone outreach was attempted today to offer the patient information about available care coordination services as a benefit of their health plan.   Follow Up Plan:  Additional outreach attempts will be made to offer the patient care coordination information and services.   Encounter Outcome:  No Answer  Care Coordination Interventions Activated:  No   Care Coordination Interventions:  No, not indicated    Raina Mina, RN Care Management Coordinator [place] Office [phone]

## 2022-03-30 ENCOUNTER — Encounter (HOSPITAL_BASED_OUTPATIENT_CLINIC_OR_DEPARTMENT_OTHER): Payer: Self-pay | Admitting: Emergency Medicine

## 2022-03-30 ENCOUNTER — Emergency Department (HOSPITAL_BASED_OUTPATIENT_CLINIC_OR_DEPARTMENT_OTHER): Payer: BC Managed Care – PPO

## 2022-03-30 ENCOUNTER — Emergency Department (HOSPITAL_BASED_OUTPATIENT_CLINIC_OR_DEPARTMENT_OTHER)
Admission: EM | Admit: 2022-03-30 | Discharge: 2022-03-30 | Disposition: A | Discharge status: Home / Self Care | Payer: BC Managed Care – PPO | Attending: Emergency Medicine | Admitting: Emergency Medicine

## 2022-03-30 DIAGNOSIS — Z1152 Encounter for screening for COVID-19: Secondary | ICD-10-CM | POA: Diagnosis not present

## 2022-03-30 DIAGNOSIS — K529 Noninfective gastroenteritis and colitis, unspecified: Secondary | ICD-10-CM

## 2022-03-30 DIAGNOSIS — R112 Nausea with vomiting, unspecified: Secondary | ICD-10-CM

## 2022-03-30 DIAGNOSIS — N83209 Unspecified ovarian cyst, unspecified side: Secondary | ICD-10-CM | POA: Diagnosis not present

## 2022-03-30 DIAGNOSIS — E860 Dehydration: Secondary | ICD-10-CM | POA: Diagnosis not present

## 2022-03-30 DIAGNOSIS — R197 Diarrhea, unspecified: Secondary | ICD-10-CM | POA: Diagnosis not present

## 2022-03-30 DIAGNOSIS — R030 Elevated blood-pressure reading, without diagnosis of hypertension: Secondary | ICD-10-CM

## 2022-03-30 DIAGNOSIS — N838 Other noninflammatory disorders of ovary, fallopian tube and broad ligament: Secondary | ICD-10-CM

## 2022-03-30 DIAGNOSIS — K259 Gastric ulcer, unspecified as acute or chronic, without hemorrhage or perforation: Secondary | ICD-10-CM | POA: Insufficient documentation

## 2022-03-30 DIAGNOSIS — I10 Essential (primary) hypertension: Secondary | ICD-10-CM | POA: Diagnosis not present

## 2022-03-30 DIAGNOSIS — N83202 Unspecified ovarian cyst, left side: Secondary | ICD-10-CM | POA: Diagnosis not present

## 2022-03-30 LAB — CBC
HCT: 37.2 % (ref 36.0–46.0)
Hemoglobin: 12.4 g/dL (ref 12.0–15.0)
MCH: 30.7 pg (ref 26.0–34.0)
MCHC: 33.3 g/dL (ref 30.0–36.0)
MCV: 92.1 fL (ref 80.0–100.0)
Platelets: 191 10*3/uL (ref 150–400)
RBC: 4.04 MIL/uL (ref 3.87–5.11)
RDW: 11.9 % (ref 11.5–15.5)
WBC: 8.2 10*3/uL (ref 4.0–10.5)
nRBC: 0 % (ref 0.0–0.2)

## 2022-03-30 LAB — URINALYSIS, ROUTINE W REFLEX MICROSCOPIC
Bilirubin Urine: NEGATIVE
Glucose, UA: NEGATIVE mg/dL
Ketones, ur: NEGATIVE mg/dL
Leukocytes,Ua: NEGATIVE
Nitrite: NEGATIVE
Protein, ur: NEGATIVE mg/dL
Specific Gravity, Urine: 1.02 (ref 1.005–1.030)
pH: 6 (ref 5.0–8.0)

## 2022-03-30 LAB — COMPREHENSIVE METABOLIC PANEL
ALT: 10 U/L (ref 0–44)
AST: 16 U/L (ref 15–41)
Albumin: 3.3 g/dL — ABNORMAL LOW (ref 3.5–5.0)
Alkaline Phosphatase: 54 U/L (ref 38–126)
Anion gap: 5 (ref 5–15)
BUN: 11 mg/dL (ref 6–20)
CO2: 20 mmol/L — ABNORMAL LOW (ref 22–32)
Calcium: 8.5 mg/dL — ABNORMAL LOW (ref 8.9–10.3)
Chloride: 112 mmol/L — ABNORMAL HIGH (ref 98–111)
Creatinine, Ser: 0.55 mg/dL (ref 0.44–1.00)
GFR, Estimated: >60 mL/min (ref >60)
Glucose, Bld: 91 mg/dL (ref 70–99)
Potassium: 3.9 mmol/L (ref 3.5–5.1)
Sodium: 137 mmol/L (ref 135–145)
Total Bilirubin: 1.1 mg/dL (ref 0.3–1.2)
Total Protein: 6.6 g/dL (ref 6.5–8.1)

## 2022-03-30 LAB — URINALYSIS, MICROSCOPIC (REFLEX)

## 2022-03-30 LAB — RESP PANEL BY RT-PCR (FLU A&B, COVID) ARPGX2
Influenza A by PCR: NEGATIVE
Influenza B by PCR: NEGATIVE
SARS Coronavirus 2 by RT PCR: NEGATIVE

## 2022-03-30 LAB — TROPONIN I (HIGH SENSITIVITY): Troponin I (High Sensitivity): 4 ng/L (ref <18)

## 2022-03-30 LAB — PREGNANCY, URINE: Preg Test, Ur: NEGATIVE

## 2022-03-30 MED ORDER — IOHEXOL 300 MG/ML  SOLN
100.0000 mL | Freq: Once | INTRAMUSCULAR | Status: AC | PRN
  Administered 2022-03-30: 100 mL via INTRAVENOUS

## 2022-03-30 MED ORDER — ONDANSETRON 8 MG PO TBDP: 8.0000 mg | ORAL_TABLET | Freq: Three times a day (TID) | ORAL | 0 refills | Status: DC | PRN

## 2022-03-30 MED ORDER — ONDANSETRON HCL 4 MG/2ML IJ SOLN
4.0000 mg | Freq: Once | INTRAMUSCULAR | Status: AC
  Administered 2022-03-30: 4 mg via INTRAVENOUS
  Filled 2022-03-30: qty 2

## 2022-03-30 MED ORDER — SODIUM CHLORIDE 0.9 % IV BOLUS
1000.0000 mL | Freq: Once | INTRAVENOUS | Status: AC
  Administered 2022-03-30: 1000 mL via INTRAVENOUS

## 2022-03-30 MED ORDER — HYDROMORPHONE HCL 1 MG/ML IJ SOLN
0.5000 mg | Freq: Once | INTRAMUSCULAR | Status: AC
  Administered 2022-03-30: 0.5 mg via INTRAVENOUS
  Filled 2022-03-30: qty 1

## 2022-03-30 MED ORDER — LACTATED RINGERS IV BOLUS
1000.0000 mL | Freq: Once | INTRAVENOUS | Status: AC
  Administered 2022-03-30: 1000 mL via INTRAVENOUS

## 2022-03-30 NOTE — Discharge Instructions (Addendum)
It was our pleasure to provide your ER care today - we hope that you feel better.  Drink plenty of fluids/stay well hydrated. You may take zofran as need for nausea.  Your ct scan was read as showing:   12.1 cm cystic lesion within the pelvis, likely arising from the left ovary. Given size, this is suspicious for low-grade cystic neoplasm.   As relates this large ovarian mass, you must follow up closely with an OB/GYN doctor in the next few weeks - have them review your CT and ultrasound and discuss plan with them then.   Follow up with primary care doctor in 1-2 days if symptoms fail to improve/resolve. Your blood pressure is high today - continue your blood pressure med, limit salt intake, and follow up with your doctor in one week.   Return to ER if worse, new symptoms, fevers, new or worsening or severe abdominal pain, persistent vomiting, or other concern.

## 2022-03-30 NOTE — ED Provider Notes (Signed)
[facility] [facility] EMERGENCY DEPARTMENT Provider Note   CSN: [id number] Arrival date & time: 03/30/22  0737     History  Chief Complaint  Patient presents with   Emesis   Abdominal Pain    Anne [name] is a 41 y.o. female.  Pt with c/o nausea, vomiting and diarrhea for past 8-9 hours. Symptoms acute onset, moderate, persistent, several episodes. Significant other w recent similar symptoms. Diarrhea watery. Emesis clear to yellowish, not bloody or bilious. Mild intermittent diffuse cramping abd discomfort that occasionally feels in chest - no constant and/or focal abd pain. No distension. No known bad food ingestion, travel or abx use. No cough or uri symptoms. No sob. No fever or chills. No dysuria or gu c/o.   The history is provided by the patient and medical records.  Emesis Associated symptoms: abdominal pain and diarrhea   Associated symptoms: no chills, no cough, no fever, no headaches and no sore throat   Abdominal Pain Associated symptoms: diarrhea and vomiting   Associated symptoms: no chills, no cough, no dysuria, no fever, no shortness of breath and no sore throat        Home Medications Prior to Admission medications   Medication Sig Start Date End Date Taking? Authorizing Provider  amLODipine (NORVASC) 5 MG tablet  12/23/18   [provider]  cyclobenzaprine (FLEXERIL) 5 MG tablet Take 5 mg by mouth at bedtime. 12/26/16   [provider]  hydrochlorothiazide (HYDRODIURIL) 12.5 MG tablet  12/23/18   [provider]  ibuprofen (ADVIL,MOTRIN) 600 MG tablet TAKE 1 TABLET BY MOUTH EVERY 8 HOURS AS NEEDED FOR HEADACHE. TAKE WITH FOOD 01/23/17   Alfonse Spruce, FNP  methimazole (TAPAZOLE) 10 MG tablet Take 6 tablets (60 mg total) by mouth daily. 12/17/16   Domenic Polite, MD  naproxen (NAPROSYN) 500 MG tablet Take 500 mg by mouth 2 (two) times daily. 04/15/19   [provider]  OMEPRAZOLE PO Take by mouth.    [provider]      Allergies    Food allergy formula and Shellfish allergy    Review of Systems   Review of Systems  Constitutional:  Negative for chills and fever.  HENT:  Negative for sore throat.   Eyes:  Negative for redness.  Respiratory:  Negative for cough and shortness of breath.   Cardiovascular:  Negative for leg swelling.  Gastrointestinal:  Positive for abdominal pain, diarrhea and vomiting.  Genitourinary:  Negative for dysuria and flank pain.  Musculoskeletal:  Negative for back pain.  Skin:  Negative for rash.  Neurological:  Negative for headaches.  Hematological:  Does not bruise/bleed easily.  Psychiatric/Behavioral:  Negative for confusion.     Physical Exam Updated Vital Signs BP (!) 154/97   Pulse 92   Temp 98.6 F (37 C)   Resp (!) 28   Ht 1.778 m (5\' 10" )   Wt 110.7 kg   LMP 03/08/2022   SpO2 100%   BMI 35.01 kg/m  Physical Exam Vitals and nursing note reviewed.  Constitutional:      Appearance: Normal appearance. She is well-developed.  HENT:     Head: Atraumatic.     Nose: Nose normal.     Mouth/Throat:     Mouth: Mucous membranes are moist.  Eyes:     General: No scleral icterus.    Conjunctiva/sclera: Conjunctivae normal.  Neck:     Trachea: No tracheal deviation.  Cardiovascular:     Rate and Rhythm:  Normal rate and regular rhythm.     Pulses: Normal pulses.     Heart sounds: Normal heart sounds. No murmur heard.    No friction rub. No gallop.  Pulmonary:     Effort: Pulmonary effort is normal. No respiratory distress.     Breath sounds: Normal breath sounds.  Abdominal:     General: Bowel sounds are normal. There is no distension.     Palpations: Abdomen is soft. There is no mass.     Tenderness: There is no abdominal tenderness. There is no guarding.  Genitourinary:    Comments: No cva tenderness.  Musculoskeletal:        General: No swelling.     Cervical back: Normal range of motion and neck supple. No rigidity. No  muscular tenderness.     Right lower leg: No edema.     Left lower leg: No edema.  Skin:    General: Skin is warm and dry.     Findings: No rash.  Neurological:     Mental Status: She is alert.     Comments: Alert, speech normal.   Psychiatric:        Mood and Affect: Mood normal.     ED Results / Procedures / Treatments   Labs (all labs ordered are listed, but only abnormal results are displayed) Results for orders placed or performed during the hospital encounter of 03/30/22  Resp Panel by RT-PCR (Flu A&B, Covid) Anterior Nasal Swab   Specimen: Anterior Nasal Swab  Result Value Ref Range   SARS Coronavirus 2 by RT PCR NEGATIVE NEGATIVE   Influenza A by PCR NEGATIVE NEGATIVE   Influenza B by PCR NEGATIVE NEGATIVE  CBC  Result Value Ref Range   WBC 8.2 4.0 - 10.5 K/uL   RBC 4.04 3.87 - 5.11 MIL/uL   Hemoglobin 12.4 12.0 - 15.0 g/dL   HCT 37.2 36.0 - 46.0 %   MCV 92.1 80.0 - 100.0 fL   MCH 30.7 26.0 - 34.0 pg   MCHC 33.3 30.0 - 36.0 g/dL   RDW 11.9 11.5 - 15.5 %   Platelets 191 150 - 400 K/uL   nRBC 0.0 0.0 - 0.2 %  Urinalysis, Routine w reflex microscopic Urine, Clean Catch  Result Value Ref Range   Color, Urine YELLOW YELLOW   APPearance CLEAR CLEAR   Specific Gravity, Urine 1.020 1.005 - 1.030   pH 6.0 5.0 - 8.0   Glucose, UA NEGATIVE NEGATIVE mg/dL   Hgb urine dipstick TRACE (A) NEGATIVE   Bilirubin Urine NEGATIVE NEGATIVE   Ketones, ur NEGATIVE NEGATIVE mg/dL   Protein, ur NEGATIVE NEGATIVE mg/dL   Nitrite NEGATIVE NEGATIVE   Leukocytes,Ua NEGATIVE NEGATIVE  Pregnancy, urine  Result Value Ref Range   Preg Test, Ur NEGATIVE NEGATIVE  Urinalysis, Microscopic (reflex)  Result Value Ref Range   RBC / HPF 0-5 0 - 5 RBC/hpf   WBC, UA 0-5 0 - 5 WBC/hpf   Bacteria, UA MANY (A) NONE SEEN   Squamous Epithelial / LPF 0-5 0 - 5   Mucus PRESENT   Comprehensive metabolic panel  Result Value Ref Range   Sodium 137 135 - 145 mmol/L   Potassium 3.9 3.5 - 5.1 mmol/L    Chloride 112 (H) 98 - 111 mmol/L   CO2 20 (L) 22 - 32 mmol/L   Glucose, Bld 91 70 - 99 mg/dL   BUN 11 6 - 20 mg/dL   Creatinine, Ser 0.55 0.44 - 1.00 mg/dL  Calcium 8.5 (L) 8.9 - 10.3 mg/dL   Total Protein 6.6 6.5 - 8.1 g/dL   Albumin 3.3 (L) 3.5 - 5.0 g/dL   AST 16 15 - 41 U/L   ALT 10 0 - 44 U/L   Alkaline Phosphatase 54 38 - 126 U/L   Total Bilirubin 1.1 0.3 - 1.2 mg/dL   GFR, Estimated >29 >51 mL/min   Anion gap 5 5 - 15  Troponin I (High Sensitivity)  Result Value Ref Range   Troponin I (High Sensitivity) 4 <18 ng/L   EKG EKG Interpretation  Date/Time:  Friday March 30 2022 07:46:11 EST Ventricular Rate:  98 PR Interval:  101 QRS Duration: 84 QT Interval:  357 QTC Calculation: 456 R Axis:   43 Text Interpretation: Sinus rhythm Short PR interval Nonspecific ST abnormality No significant change since last tracing Confirmed by Cathren Laine ([id number]) on 03/30/2022 7:56:35 AM  Radiology US Pelvis Complete  Result Date: 03/30/2022 CLINICAL DATA:  Left lower quadrant abdominal pain. EXAM: TRANSABDOMINAL ULTRASOUND OF PELVIS TECHNIQUE: Transabdominal ultrasound examination of the pelvis was performed including evaluation of the uterus, ovaries, adnexal regions, and pelvic cul-de-sac. COMPARISON:  CT scan of same day. FINDINGS: Uterus Measurements: 7.2 x 5.0 x 3.6 cm = volume: 68 mL. No fibroids or other mass visualized. Endometrium Thickness: 8 mm which is within normal limits. No focal abnormality visualized. Right ovary Measurements: 3.8 x 3.6 x 2.4 cm = volume: 17 mL. 1.9 cm simple cyst is noted. Left ovary 13.8 x 9.5 x 7.2 cm cystic lesion is noted in left adnexal region as described on prior CT scan. Normal left ovarian parenchyma is not identified. Other findings:  No abnormal free fluid. IMPRESSION: 13.8 cm cystic lesion is noted in left adnexal region as described on prior CT scan, most likely ovarian in etiology. As noted on prior exam, MRI with and without gadolinium is  recommended to evaluate for possible cystic ovarian neoplasm. Electronically Signed   By: Lupita Raider M.D.   On: 03/30/2022 15:10   CT Abdomen Pelvis W Contrast  Result Date: 03/30/2022 CLINICAL DATA:  Stomach cramping with nausea vomiting and diarrhea. EXAM: CT ABDOMEN AND PELVIS WITH CONTRAST TECHNIQUE: Multidetector CT imaging of the abdomen and pelvis was performed using the standard protocol following bolus administration of intravenous contrast. RADIATION DOSE REDUCTION: This exam was performed according to the departmental dose-optimization program which includes automated exposure control, adjustment of the mA and/or kV according to patient size and/or use of iterative reconstruction technique. CONTRAST:  OMNIPAQUE IOHEXOL 300 MG/ML  SOLN COMPARISON:  None Available. FINDINGS: Lower chest: Clear lung bases. Normal heart size without pericardial or pleural effusion. Hepatobiliary: Normal liver. Normal gallbladder, without biliary ductal dilatation. Pancreas: Normal, without mass or ductal dilatation. Spleen: Normal in size, without focal abnormality. Adrenals/Urinary Tract: Normal adrenal glands. Normal kidneys, without hydronephrosis. Normal urinary bladder. Stomach/Bowel: Normal stomach, without wall thickening. Normal colon, appendix, and terminal ileum. Normal small bowel. Vascular/Lymphatic: Normal caliber of the aorta and branch vessels. No abdominopelvic adenopathy. Reproductive: The uterus is normal in appearance but displaced to the right. Normal right ovary on 69/2. A posterior pelvic, minimally eccentric left 10.6 x 12.1 by 11.4 cm lesion on 73/2 and sagittal image 76 appears simple, measuring simple fluid density. Other: No significant free fluid. No evidence of omental or peritoneal disease. No free intraperitoneal air. Musculoskeletal: No acute osseous abnormality. IMPRESSION: 1. Dominant 12.1 cm cystic lesion within the pelvis, likely arising from the left ovary. Given  size, this  is suspicious for low-grade cystic neoplasm. Gynecological consultation and consideration of pre and post contrast abdominal MRI suggested. This recommendation follows ACR consensus guidelines: White Paper of the ACR Incidental Findings Committee II on Adnexal Findings. J Am Coll Radiol 2013:10:675-681. 2. No abdominopelvic adenopathy or peritoneal metastasis. 3. No bowel obstruction or other explanation for patient's symptoms. Electronically Signed   By: Abigail Miyamoto M.D.   On: 03/30/2022 13:24    Procedures Procedures    Medications Ordered in ED Medications  HYDROmorphone (DILAUDID) injection 0.5 mg (has no administration in time range)  ondansetron (ZOFRAN) injection 4 mg (has no administration in time range)  sodium chloride 0.9 % bolus 1,000 mL (0 mLs Intravenous Stopped 03/30/22 0930)  ondansetron (ZOFRAN) injection 4 mg (4 mg Intravenous Given 03/30/22 0829)  lactated ringers bolus 1,000 mL (0 mLs Intravenous Stopped 03/30/22 1103)    ED Course/ Medical Decision Making/ A&P                           Medical Decision Making Problems Addressed: Dehydration: acute illness or injury with systemic symptoms that poses a threat to life or bodily functions Elevated blood pressure reading: acute illness or injury Essential hypertension: chronic illness or injury with exacerbation, progression, or side effects of treatment that poses a threat to life or bodily functions Gastroenteritis: acute illness or injury with systemic symptoms Nausea vomiting and diarrhea: acute illness or injury with systemic symptoms that poses a threat to life or bodily functions Ovarian mass: undiagnosed new problem with uncertain prognosis    Details: Close ob/gyn f/u 12/14 Uncontrolled hypertension: acute illness or injury that poses a threat to life or bodily functions  Amount and/or Complexity of Data Reviewed External Data Reviewed: notes. Labs: ordered. Decision-making details documented in ED  Course. Radiology: ordered and independent interpretation performed. Decision-making details documented in ED Course. ECG/medicine tests: ordered and independent interpretation performed. Decision-making details documented in ED Course.  Risk Prescription drug management. Parenteral controlled substances. Decision regarding hospitalization.   Iv ns. Continuous pulse ox and cardiac monitoring. Labs ordered/sent.   Diff dx includes dehydration, aki, uti, viral GE, food poisoning, etc - dispo decision including potential need for admission considered - will get labs and give tx and reassess.  Reviewed nursing notes and prior charts for additional history. External reports reviewed.   Cardiac monitor: sinus rhythm, rate 88.  Labs reviewed/interpreted by me - chem normal.  Wbc normal.   Iv ns bolus. Zofran iv.   LR bolus.   Pt notes increased abd pain, mid abd, +tenderness on repeat exam, and persistent nausea, requests pain/nausea med.  Dilaudid .5 mg iv. Zofran iv. Imaging ordered.  CT reviewed/interpreted by me - no sbo/infxn. Note made of large adnexal?ovarian mass, given persistent pain, will get u/s r/o torsion - if neg for torsion, rec close gyn f/u to exclude malignancy.    U/s reviewed/interpreted by me - large ovarian mass/cyst - discussed diff with pt incl cyst vs cancer, and need for close gyn f/u - pt indicates has upcoming gyn appt, Dr Charlesetta Garibaldi, on 12/14 and will discuss and have them review imaging then.   Regarding bp, pt indicates has adequate meds at home, rec f/u pcp 1 week. No h/a. No  cp or sob. No edema.   Trial of po fluids.   No recurrent nv. Abd is soft nt. Pain resolved.   Pt appears stable for d/c.  Return precautions provided.  Final Clinical Impression(s) / ED Diagnoses Final diagnoses:  Nausea vomiting and diarrhea  Gastroenteritis  Dehydration  Elevated blood pressure reading  Essential hypertension  Uncontrolled hypertension     Rx / DC Orders ED Discharge Orders     None         Lajean Saver, MD 03/30/22 1541

## 2022-03-30 NOTE — ED Triage Notes (Signed)
States since 0100 this am has been having stomach cramping and n/v/d. Boyfriend had same symptoms earlier during week

## 2022-04-05 DIAGNOSIS — Z124 Encounter for screening for malignant neoplasm of cervix: Secondary | ICD-10-CM | POA: Diagnosis not present

## 2022-04-05 DIAGNOSIS — Z113 Encounter for screening for infections with a predominantly sexual mode of transmission: Secondary | ICD-10-CM | POA: Diagnosis not present

## 2022-04-05 DIAGNOSIS — Z6839 Body mass index (BMI) 39.0-39.9, adult: Secondary | ICD-10-CM | POA: Diagnosis not present

## 2022-04-05 DIAGNOSIS — Z01419 Encounter for gynecological examination (general) (routine) without abnormal findings: Secondary | ICD-10-CM | POA: Diagnosis not present

## 2022-04-05 DIAGNOSIS — N83209 Unspecified ovarian cyst, unspecified side: Secondary | ICD-10-CM | POA: Diagnosis not present

## 2022-04-05 DIAGNOSIS — N83202 Unspecified ovarian cyst, left side: Secondary | ICD-10-CM | POA: Diagnosis not present

## 2022-04-05 DIAGNOSIS — N920 Excessive and frequent menstruation with regular cycle: Secondary | ICD-10-CM | POA: Diagnosis not present

## 2022-04-08 DIAGNOSIS — N83209 Unspecified ovarian cyst, unspecified side: Secondary | ICD-10-CM | POA: Insufficient documentation

## 2022-04-19 ENCOUNTER — Other Ambulatory Visit: Payer: Self-pay | Admitting: Obstetrics and Gynecology

## 2022-05-11 ENCOUNTER — Emergency Department (HOSPITAL_BASED_OUTPATIENT_CLINIC_OR_DEPARTMENT_OTHER): Payer: BC Managed Care – PPO

## 2022-05-11 ENCOUNTER — Encounter (HOSPITAL_BASED_OUTPATIENT_CLINIC_OR_DEPARTMENT_OTHER): Payer: Self-pay | Admitting: Emergency Medicine

## 2022-05-11 ENCOUNTER — Emergency Department (HOSPITAL_BASED_OUTPATIENT_CLINIC_OR_DEPARTMENT_OTHER)
Admission: EM | Admit: 2022-05-11 | Discharge: 2022-05-11 | Disposition: A | Discharge status: Home / Self Care | Payer: BC Managed Care – PPO | Attending: Emergency Medicine | Admitting: Emergency Medicine

## 2022-05-11 ENCOUNTER — Other Ambulatory Visit: Payer: Self-pay

## 2022-05-11 DIAGNOSIS — U071 COVID-19: Secondary | ICD-10-CM | POA: Insufficient documentation

## 2022-05-11 DIAGNOSIS — J069 Acute upper respiratory infection, unspecified: Secondary | ICD-10-CM | POA: Insufficient documentation

## 2022-05-11 DIAGNOSIS — R079 Chest pain, unspecified: Secondary | ICD-10-CM | POA: Diagnosis not present

## 2022-05-11 DIAGNOSIS — R509 Fever, unspecified: Secondary | ICD-10-CM | POA: Diagnosis not present

## 2022-05-11 DIAGNOSIS — Z79899 Other long term (current) drug therapy: Secondary | ICD-10-CM | POA: Diagnosis not present

## 2022-05-11 DIAGNOSIS — R0600 Dyspnea, unspecified: Secondary | ICD-10-CM | POA: Diagnosis not present

## 2022-05-11 DIAGNOSIS — I10 Essential (primary) hypertension: Secondary | ICD-10-CM | POA: Insufficient documentation

## 2022-05-11 DIAGNOSIS — B9789 Other viral agents as the cause of diseases classified elsewhere: Secondary | ICD-10-CM | POA: Diagnosis not present

## 2022-05-11 DIAGNOSIS — R0789 Other chest pain: Secondary | ICD-10-CM | POA: Diagnosis not present

## 2022-05-11 HISTORY — DX: COVID-19: U07.1

## 2022-05-11 HISTORY — DX: Thyrotoxicosis with diffuse goiter without thyrotoxic crisis or storm: E05.00

## 2022-05-11 LAB — CBC WITH DIFFERENTIAL/PLATELET
Abs Immature Granulocytes: 0.01 10*3/uL (ref 0.00–0.07)
Basophils Absolute: 0 10*3/uL (ref 0.0–0.1)
Basophils Relative: 0 %
Eosinophils Absolute: 0.1 10*3/uL (ref 0.0–0.5)
Eosinophils Relative: 2 %
HCT: 36.2 % (ref 36.0–46.0)
Hemoglobin: 11.9 g/dL — ABNORMAL LOW (ref 12.0–15.0)
Immature Granulocytes: 0 %
Lymphocytes Relative: 36 %
Lymphs Abs: 1.4 10*3/uL (ref 0.7–4.0)
MCH: 29.6 pg (ref 26.0–34.0)
MCHC: 32.9 g/dL (ref 30.0–36.0)
MCV: 90 fL (ref 80.0–100.0)
Monocytes Absolute: 0.5 10*3/uL (ref 0.1–1.0)
Monocytes Relative: 13 %
Neutro Abs: 1.9 10*3/uL (ref 1.7–7.7)
Neutrophils Relative %: 49 %
Platelets: 162 10*3/uL (ref 150–400)
RBC: 4.02 MIL/uL (ref 3.87–5.11)
RDW: 12.4 % (ref 11.5–15.5)
WBC: 3.8 10*3/uL — ABNORMAL LOW (ref 4.0–10.5)
nRBC: 0 % (ref 0.0–0.2)

## 2022-05-11 LAB — COMPREHENSIVE METABOLIC PANEL
ALT: 39 U/L (ref 0–44)
AST: 50 U/L — ABNORMAL HIGH (ref 15–41)
Albumin: 3.5 g/dL (ref 3.5–5.0)
Alkaline Phosphatase: 62 U/L (ref 38–126)
Anion gap: 8 (ref 5–15)
BUN: 8 mg/dL (ref 6–20)
CO2: 22 mmol/L (ref 22–32)
Calcium: 8.7 mg/dL — ABNORMAL LOW (ref 8.9–10.3)
Chloride: 107 mmol/L (ref 98–111)
Creatinine, Ser: 0.55 mg/dL (ref 0.44–1.00)
GFR, Estimated: >60 mL/min (ref >60)
Glucose, Bld: 92 mg/dL (ref 70–99)
Potassium: 3.9 mmol/L (ref 3.5–5.1)
Sodium: 137 mmol/L (ref 135–145)
Total Bilirubin: 0.4 mg/dL (ref 0.3–1.2)
Total Protein: 7 g/dL (ref 6.5–8.1)

## 2022-05-11 LAB — TSH: TSH: <0.01 u[IU]/mL — ABNORMAL LOW (ref 0.350–4.500)

## 2022-05-11 LAB — RESP PANEL BY RT-PCR (RSV, FLU A&B, COVID)  RVPGX2
Influenza A by PCR: NEGATIVE
Influenza B by PCR: NEGATIVE
Resp Syncytial Virus by PCR: NEGATIVE
SARS Coronavirus 2 by RT PCR: POSITIVE — AB

## 2022-05-11 LAB — URINALYSIS, ROUTINE W REFLEX MICROSCOPIC
Bilirubin Urine: NEGATIVE
Glucose, UA: NEGATIVE mg/dL
Hgb urine dipstick: NEGATIVE
Ketones, ur: NEGATIVE mg/dL
Leukocytes,Ua: NEGATIVE
Nitrite: NEGATIVE
Protein, ur: NEGATIVE mg/dL
Specific Gravity, Urine: 1.015 (ref 1.005–1.030)
pH: 8.5 — ABNORMAL HIGH (ref 5.0–8.0)

## 2022-05-11 LAB — PREGNANCY, URINE: Preg Test, Ur: NEGATIVE

## 2022-05-11 LAB — GROUP A STREP BY PCR: Group A Strep by PCR: NOT DETECTED

## 2022-05-11 LAB — T4, FREE: Free T4: 3.22 ng/dL — ABNORMAL HIGH (ref 0.61–1.12)

## 2022-05-11 LAB — LIPASE, BLOOD: Lipase: 31 U/L (ref 11–51)

## 2022-05-11 LAB — TROPONIN I (HIGH SENSITIVITY): Troponin I (High Sensitivity): 6 ng/L (ref <18)

## 2022-05-11 MED ORDER — AMLODIPINE BESYLATE 5 MG PO TABS
5.0000 mg | ORAL_TABLET | Freq: Once | ORAL | Status: AC
  Administered 2022-05-11: 5 mg via ORAL
  Filled 2022-05-11: qty 1

## 2022-05-11 MED ORDER — AMLODIPINE BESYLATE 5 MG PO TABS: 5.0000 mg | ORAL_TABLET | Freq: Every day | ORAL | 0 refills | Status: DC

## 2022-05-11 MED ORDER — KETOROLAC TROMETHAMINE 15 MG/ML IJ SOLN
15.0000 mg | Freq: Once | INTRAMUSCULAR | Status: AC
  Administered 2022-05-11: 15 mg via INTRAVENOUS
  Filled 2022-05-11: qty 1

## 2022-05-11 MED ORDER — LACTATED RINGERS IV BOLUS
1000.0000 mL | Freq: Once | INTRAVENOUS | Status: AC
  Administered 2022-05-11: 1000 mL via INTRAVENOUS

## 2022-05-11 MED ORDER — ACETAMINOPHEN 500 MG PO TABS
1000.0000 mg | ORAL_TABLET | Freq: Once | ORAL | Status: AC
  Administered 2022-05-11: 1000 mg via ORAL
  Filled 2022-05-11: qty 2

## 2022-05-11 MED ORDER — PAXLOVID (300/100) 20 X 150 MG & 10 X 100MG PO TBPK: 3.0000 | ORAL_TABLET | Freq: Two times a day (BID) | ORAL | 0 refills | Status: AC

## 2022-05-11 NOTE — ED Triage Notes (Signed)
Runny nose, sore throat, sob, chest tightness, body aches, some nausea, no V/D, productive cough since Wednesday.  No known fever.

## 2022-05-11 NOTE — ED Provider Notes (Signed)
[facility] EMERGENCY DEPARTMENT Provider Note   CSN: [id number] Arrival date & time: 05/11/22  2831     History Chief Complaint  Patient presents with   URI    HPI Anne [name] is a 42 y.o. female presenting for chief complaint of subjective fever, chest pain, sore throat, cough, dyspnea over the past 3 days.  She states that all of her symptoms of gotten worse overnight last night and worsened in the morning this morning.  Endorses a substantial medical history of Graves' disease, hypertension.  She has been lost to follow-up because of too many missed appointments.  States she does not take any of her medications including multiple antihypertensives.  Was told she was in remission for her Graves' disease. Otherwise ambulatory tolerating p.o. intake.   Patient's recorded medical, surgical, social, medication list and allergies were reviewed in the Snapshot window as part of the initial history.   Review of Systems   Review of Systems  Constitutional:  Negative for chills and fever.  HENT:  Positive for sore throat. Negative for ear pain.   Eyes:  Negative for pain and visual disturbance.  Respiratory:  Negative for cough and shortness of breath.   Cardiovascular:  Negative for chest pain and palpitations.  Gastrointestinal:  Negative for abdominal pain and vomiting.  Genitourinary:  Negative for dysuria and hematuria.  Musculoskeletal:  Negative for arthralgias and back pain.  Skin:  Negative for color change and rash.  Neurological:  Negative for seizures and syncope.  All other systems reviewed and are negative.   Physical Exam Updated Vital Signs BP (!) 192/103 (BP Location: Left Arm)   Pulse 99   Temp 98.3 F (36.8 C) (Oral)   Resp (!) 22   Ht 5\' 11"  (1.803 m)   Wt 110.7 kg   LMP 04/23/2022 (Exact Date)   SpO2 99%   BMI 34.03 kg/m  Physical Exam Vitals and nursing note reviewed.  Constitutional:      General: She is not in acute distress.     Appearance: She is well-developed.  HENT:     Head: Normocephalic and atraumatic.  Eyes:     Conjunctiva/sclera: Conjunctivae normal.  Cardiovascular:     Rate and Rhythm: Normal rate and regular rhythm.     Heart sounds: No murmur heard. Pulmonary:     Effort: Pulmonary effort is normal. No respiratory distress.     Breath sounds: Normal breath sounds.  Abdominal:     Palpations: Abdomen is soft.     Tenderness: There is no abdominal tenderness.  Musculoskeletal:        General: No swelling.     Cervical back: Neck supple.  Skin:    General: Skin is warm and dry.     Capillary Refill: Capillary refill takes less than 2 seconds.  Neurological:     Mental Status: She is alert.  Psychiatric:        Mood and Affect: Mood normal.      ED Course/ Medical Decision Making/ A&P    Procedures Procedures   Medications Ordered in ED Medications  lactated ringers bolus 1,000 mL (1,000 mLs Intravenous New Bag/Given 05/11/22 0847)  acetaminophen (TYLENOL) tablet 1,000 mg (1,000 mg Oral Given 05/11/22 0851)  amLODipine (NORVASC) tablet 5 mg (5 mg Oral Given 05/11/22 05/13/22)    Medical Decision Making:    Anne [name] is a 42 y.o. female who presented to the ED today with multiple complaints detailed above.  Patient's presentation is complicated by their history of multiple comorbid medical problems.  Patient placed on continuous vitals and telemetry monitoring while in ED which was reviewed periodically.   Complete initial physical exam performed, notably the patient  was hemodynamically stable in no acute distress.  Grossly hypertensive.  Slightly tachypneic.  She is in no acute distress though appears uncomfortable.  No focal abdominal pain no findings on cardiopulmonary auscultation.      Reviewed and confirmed nursing documentation for past medical history, family history, social history.    Initial Assessment:   With the patient's presentation of shortness of breath,  lightheaded, nausea,, hypertension most likely diagnosis is viral syndrome. Other diagnoses were considered including (but not limited to) hypertensive urgency, thyrotoxicosis, pulmonary embolism, ACS. These are considered less likely due to history of present illness and physical exam findings.   This is most consistent with an acute life/limb threatening illness complicated by underlying chronic conditions.  Initial Plan:  Concerning patient's risk for PE, she is Wells low criteria with no positive risk factors.  She is also PERC negative, based on this conversation, there is no acute indication for further diagnostic evaluation for pulmonary embolism Screening labs including CBC and Metabolic panel to evaluate for infectious or metabolic etiology of disease.  Urinalysis with reflex culture ordered to evaluate for UTI or relevant urologic/nephrologic pathology.  CXR to evaluate for structural/infectious intrathoracic pathology.  Troponin/EKG to evaluate for cardiac pathology. Thyroid studies to evaluate for thyrotoxicosis Viral swab to evaluate for infectious etiology Objective evaluation as below reviewed with plan for close reassessment  Initial Study Results:   Laboratory  All laboratory results reviewed without evidence of clinically relevant pathology.    EKG EKG was reviewed independently. Rate, rhythm, axis, intervals all examined and without medically relevant abnormality. ST segments without concerns for elevations.    Radiology  All images reviewed independently. Agree with radiology report at this time.   DG Chest Portable 1 View  Result Date: 05/11/2022 CLINICAL DATA:  Dyspnea and chest pain EXAM: PORTABLE CHEST 1 VIEW COMPARISON:  10/04/2021 chest radiograph. FINDINGS: Stable cardiomediastinal silhouette with top-normal heart size. No pneumothorax. No pleural effusion. Lungs appear clear, with no acute consolidative airspace disease and no pulmonary edema. IMPRESSION: No active  disease. Electronically Signed   By: Ilona Sorrel M.D.   On: 05/11/2022 08:21     Final Assessment and Plan:   Patient history present on his physicals and findings are most consistent with confirmed COVID infection.  No other focal pathology identified today.  Patient has had COVID 5 times now.  She would like to pursue Paxlovid therapy as well as restart her antihypertensive.  Blood pressure improved to 150s on her amlodipine.  Strict return precautions regarding clinical overlap with other syndromes reinforced the patient's breast understanding.  Patient discharged with no further acute events.   Disposition:  I have considered need for hospitalization, however, considering all of the above, I believe this patient is stable for discharge at this time.  Patient/family educated about specific return precautions for given chief complaint and symptoms.  Patient/family educated about follow-up with PCP.     Patient/family expressed understanding of return precautions and need for follow-up. Patient spoken to regarding all imaging and laboratory results and appropriate follow up for these results. All education provided in verbal form with additional information in written form. Time was allowed for answering of patient questions. Patient discharged.    Emergency Department Medication Summary:   Medications  lactated  ringers bolus 1,000 mL (1,000 mLs Intravenous New Bag/Given 05/11/22 0847)  acetaminophen (TYLENOL) tablet 1,000 mg (1,000 mg Oral Given 05/11/22 0851)  amLODipine (NORVASC) tablet 5 mg (5 mg Oral Given 05/11/22 0852)         Clinical Impression:  1. Viral URI with cough      Data Unavailable   Final Clinical Impression(s) / ED Diagnoses Final diagnoses:  Viral URI with cough    Rx / DC Orders ED Discharge Orders     None         Tretha Sciara, MD 05/11/22 1549

## 2022-05-25 ENCOUNTER — Ambulatory Visit: Payer: BC Managed Care – PPO | Admitting: Adult Health

## 2022-05-28 ENCOUNTER — Encounter: Payer: Self-pay | Admitting: Adult Health

## 2022-05-28 ENCOUNTER — Ambulatory Visit: Payer: BC Managed Care – PPO | Admitting: Adult Health

## 2022-05-28 VITALS — BP 180/100 | HR 94 | Temp 97.3°F | Ht 71.0 in | Wt 250.0 lb

## 2022-05-28 DIAGNOSIS — G8929 Other chronic pain: Secondary | ICD-10-CM

## 2022-05-28 DIAGNOSIS — E669 Obesity, unspecified: Secondary | ICD-10-CM

## 2022-05-28 DIAGNOSIS — Z113 Encounter for screening for infections with a predominantly sexual mode of transmission: Secondary | ICD-10-CM

## 2022-05-28 DIAGNOSIS — Z7689 Persons encountering health services in other specified circumstances: Secondary | ICD-10-CM

## 2022-05-28 DIAGNOSIS — K219 Gastro-esophageal reflux disease without esophagitis: Secondary | ICD-10-CM

## 2022-05-28 DIAGNOSIS — I10 Essential (primary) hypertension: Secondary | ICD-10-CM | POA: Diagnosis not present

## 2022-05-28 DIAGNOSIS — E0500 Thyrotoxicosis with diffuse goiter without thyrotoxic crisis or storm: Secondary | ICD-10-CM

## 2022-05-28 DIAGNOSIS — M545 Low back pain, unspecified: Secondary | ICD-10-CM

## 2022-05-28 DIAGNOSIS — Z6834 Body mass index (BMI) 34.0-34.9, adult: Secondary | ICD-10-CM

## 2022-05-28 MED ORDER — CYCLOBENZAPRINE HCL 5 MG PO TABS: 5.0000 mg | ORAL_TABLET | Freq: Every day | ORAL | 0 refills | Status: DC

## 2022-05-28 MED ORDER — HYDROCHLOROTHIAZIDE 12.5 MG PO TABS: 12.5000 mg | ORAL_TABLET | Freq: Every day | ORAL | 0 refills | Status: DC

## 2022-05-28 MED ORDER — METOPROLOL TARTRATE 25 MG PO TABS: 12.5000 mg | ORAL_TABLET | Freq: Two times a day (BID) | ORAL | 5 refills | Status: DC

## 2022-05-28 MED ORDER — AMLODIPINE BESYLATE 5 MG PO TABS: 5.0000 mg | ORAL_TABLET | Freq: Every day | ORAL | 0 refills | Status: DC

## 2022-05-28 MED ORDER — CYCLOBENZAPRINE HCL 5 MG PO TABS: 5.0000 mg | ORAL_TABLET | Freq: Every evening | ORAL | 0 refills | Status: DC | PRN

## 2022-05-28 MED ORDER — METHIMAZOLE 10 MG PO TABS: 10.0000 mg | ORAL_TABLET | Freq: Two times a day (BID) | ORAL | 0 refills | Status: DC

## 2022-05-28 NOTE — Patient Instructions (Signed)
Preventive Care 40-44 Years Old, Female Preventive care refers to lifestyle choices and visits with your health care provider that can promote health and wellness. Preventive care visits are also called wellness exams. What can I expect for my preventive care visit? Counseling Your health care provider may ask you questions about your: Medical history, including: Past medical problems. Family medical history. Pregnancy history. Current health, including: Menstrual cycle. Method of birth control. Emotional well-being. Home life and relationship well-being. Sexual activity and sexual health. Lifestyle, including: Alcohol, nicotine or tobacco, and drug use. Access to firearms. Diet, exercise, and sleep habits. Work and work environment. Sunscreen use. Safety issues such as seatbelt and bike helmet use. Physical exam Your health care provider will check your: Height and weight. These may be used to calculate your BMI (body mass index). BMI is a measurement that tells if you are at a healthy weight. Waist circumference. This measures the distance around your waistline. This measurement also tells if you are at a healthy weight and may help predict your risk of certain diseases, such as type 2 diabetes and high blood pressure. Heart rate and blood pressure. Body temperature. Skin for abnormal spots. What immunizations do I need?  Vaccines are usually given at various ages, according to a schedule. Your health care provider will recommend vaccines for you based on your age, medical history, and lifestyle or other factors, such as travel or where you work. What tests do I need? Screening Your health care provider may recommend screening tests for certain conditions. This may include: Lipid and cholesterol levels. Diabetes screening. This is done by checking your blood sugar (glucose) after you have not eaten for a while (fasting). Pelvic exam and Pap test. Hepatitis B test. Hepatitis C  test. HIV (human immunodeficiency virus) test. STI (sexually transmitted infection) testing, if you are at risk. Lung cancer screening. Colorectal cancer screening. Mammogram. Talk with your health care provider about when you should start having regular mammograms. This may depend on whether you have a family history of breast cancer. BRCA-related cancer screening. This may be done if you have a family history of breast, ovarian, tubal, or peritoneal cancers. Bone density scan. This is done to screen for osteoporosis. Talk with your health care provider about your test results, treatment options, and if necessary, the need for more tests. Follow these instructions at home: Eating and drinking  Eat a diet that includes fresh fruits and vegetables, whole grains, lean protein, and low-fat dairy products. Take vitamin and mineral supplements as recommended by your health care provider. Do not drink alcohol if: Your health care provider tells you not to drink. You are pregnant, may be pregnant, or are planning to become pregnant. If you drink alcohol: Limit how much you have to 0-1 drink a day. Know how much alcohol is in your drink. In the [place] one drink equals one 12 oz bottle of beer (355 mL), one 5 oz glass of wine (148 mL), or one 1 oz glass of hard liquor (44 mL). Lifestyle Brush your teeth every morning and night with fluoride toothpaste. Floss one time each day. Exercise for at least 30 minutes 5 or more days each week. Do not use any products that contain nicotine or tobacco. These products include cigarettes, chewing tobacco, and vaping devices, such as e-cigarettes. If you need help quitting, ask your health care provider. Do not use drugs. If you are sexually active, practice safe sex. Use a condom or other form of protection to   prevent STIs. If you do not wish to become pregnant, use a form of birth control. If you plan to become pregnant, see your health care provider for a  prepregnancy visit. Take aspirin only as told by your health care provider. Make sure that you understand how much to take and what form to take. Work with your health care provider to find out whether it is safe and beneficial for you to take aspirin daily. Find healthy ways to manage stress, such as: Meditation, yoga, or listening to music. Journaling. Talking to a trusted person. Spending time with friends and family. Minimize exposure to UV radiation to reduce your risk of skin cancer. Safety Always wear your seat belt while driving or riding in a vehicle. Do not drive: If you have been drinking alcohol. Do not ride with someone who has been drinking. When you are tired or distracted. While texting. If you have been using any mind-altering substances or drugs. Wear a helmet and other protective equipment during sports activities. If you have firearms in your house, make sure you follow all gun safety procedures. Seek help if you have been physically or sexually abused. What's next? Visit your health care provider once a year for an annual wellness visit. Ask your health care provider how often you should have your eyes and teeth checked. Stay up to date on all vaccines. This information is not intended to replace advice given to you by your health care provider. Make sure you discuss any questions you have with your health care provider. Document Revised: 10/05/2020 Document Reviewed: 10/05/2020 Elsevier Patient Education  2023 Elsevier Inc.  

## 2022-05-28 NOTE — Progress Notes (Unsigned)
[facility] clinic  Provider: Durenda Age DNP  Code Status:  Full Code  Goals of Care:     05/11/2022    7:53 AM  Advanced Directives  Does Patient Have a Medical Advance Directive? No  Would patient like information on creating a medical advance directive? No - Patient declined     Chief Complaint  Patient presents with   New Patient (Initial Visit)    Patient presents today for a new patient appointment    HPI: Patient is a 42 y.o. female seen today to establish care with [facility]. She is single and lives together with her boyfriend. She graduated high school and works as the Radio broadcast assistant at [employer]. She was recently seen at  [facility] ED on 05/11/22 for COVID-19 infection and was prescribed Paxlovid. Per patient, she stopped taking her Methimazole because she was told to stop taking it by her endocrinologist. Noted lab elevated at the ED visit, tsh 0.010 and AST 50. She stated that she has palpitations once or twice a week. She is due for laparoscopic ovarian cystectomy on 06/11/22. She smokes a pack a day since she was 42 years old. She drinks half a bottle of Vodka on weekends.     Past Medical History:  Diagnosis Date   Abnormal echocardiogram    a. 11/2016 - increased LVOT gradient.   Asthma    Graves disease    Mild mitral regurgitation    Normocytic anemia    Obesity    Thyrotoxicosis 11/2014   Tobacco abuse    Tricuspid regurgitation     Past Surgical History:  Procedure Laterality Date   ECTOPIC PREGNANCY SURGERY     MANDIBLE SURGERY     TUBAL LIGATION      Allergies  Allergen Reactions   Food Allergy Formula Anaphylaxis and Swelling    coconut   Shellfish Allergy Swelling    Outpatient Encounter Medications as of 05/28/2022  Medication Sig   amLODipine (NORVASC) 5 MG tablet    amLODipine (NORVASC) 5 MG tablet Take 1 tablet (5 mg total) by mouth daily.   cyclobenzaprine (FLEXERIL) 5 MG tablet Take 5 mg by mouth at bedtime.    hydrochlorothiazide (HYDRODIURIL) 12.5 MG tablet    ibuprofen (ADVIL,MOTRIN) 600 MG tablet TAKE 1 TABLET BY MOUTH EVERY 8 HOURS AS NEEDED FOR HEADACHE. TAKE WITH FOOD   methimazole (TAPAZOLE) 10 MG tablet Take 6 tablets (60 mg total) by mouth daily.   naproxen (NAPROSYN) 500 MG tablet Take 500 mg by mouth 2 (two) times daily.   OMEPRAZOLE PO Take by mouth.   ondansetron (ZOFRAN-ODT) 8 MG disintegrating tablet Take 1 tablet (8 mg total) by mouth every 8 (eight) hours as needed for nausea or vomiting.   No facility-administered encounter medications on file as of 05/28/2022.    Review of Systems:  Review of Systems  Constitutional:  Positive for unexpected weight change. Negative for appetite change, chills, fatigue and fever.       Had been having weight loss but has good appetite  HENT:  Negative for congestion, hearing loss, rhinorrhea and sore throat.   Eyes: Negative.   Respiratory:  Negative for cough, shortness of breath and wheezing.   Cardiovascular:  Positive for palpitations. Negative for chest pain and leg swelling.       Palpitation 2-3X/week  Gastrointestinal:  Negative for abdominal pain, constipation, diarrhea, nausea and vomiting.  Genitourinary:  Negative for dysuria.  Musculoskeletal:  Negative for arthralgias, back pain and  myalgias.  Skin:  Negative for color change, rash and wound.  Neurological:  Negative for dizziness, weakness and headaches.  Psychiatric/Behavioral:  Negative for behavioral problems. The patient is not nervous/anxious.     Health Maintenance  Topic Date Due   COVID-19 Vaccine (1) Never done   Hepatitis C Screening  Never done   DTaP/Tdap/Td (1 - Tdap) Never done   PAP SMEAR-Modifier  Never done   INFLUENZA VACCINE  Never done   HIV Screening  Completed   HPV VACCINES  Aged Out    Physical Exam: Vitals:   05/28/22 1002  Weight: 250 lb (113.4 kg)  Height: 5\' 11"  (1.803 m)   Body mass index is 34.87 kg/m. Physical Exam Constitutional:       Appearance: She is obese.  HENT:     Head: Normocephalic and atraumatic.     Nose: Nose normal.     Mouth/Throat:     Mouth: Mucous membranes are moist.  Eyes:     Conjunctiva/sclera: Conjunctivae normal.  Cardiovascular:     Rate and Rhythm: Normal rate and regular rhythm.  Pulmonary:     Effort: Pulmonary effort is normal.     Breath sounds: Normal breath sounds.  Abdominal:     General: Bowel sounds are normal.     Palpations: Abdomen is soft.  Musculoskeletal:        General: Normal range of motion.     Cervical back: Normal range of motion.  Skin:    General: Skin is warm and dry.  Neurological:     General: No focal deficit present.     Mental Status: She is alert and oriented to person, place, and time.  Psychiatric:        Mood and Affect: Mood normal.        Behavior: Behavior normal.        Thought Content: Thought content normal.        Judgment: Judgment normal.    Labs reviewed: Basic Metabolic Panel: Recent Labs    10/04/21 1150 03/30/22 1137 05/11/22 0843  NA 139 137 137  K 3.9 3.9 3.9  CL 107 112* 107  CO2 22 20* 22  GLUCOSE 80 91 92  BUN 13 11 8   CREATININE 0.85 0.55 0.55  CALCIUM 9.6 8.5* 8.7*  TSH  --   --  <0.010*   Liver Function Tests: Recent Labs    03/30/22 1137 05/11/22 0843  AST 16 50*  ALT 10 39  ALKPHOS 54 62  BILITOT 1.1 0.4  PROT 6.6 7.0  ALBUMIN 3.3* 3.5   Recent Labs    05/11/22 0843  LIPASE 31   No results for input(s): "AMMONIA" in the last 8760 hours. CBC: Recent Labs    10/04/21 1150 03/30/22 0828 05/11/22 0843  WBC 7.7 8.2 3.8*  NEUTROABS  --   --  1.9  HGB 13.8 12.4 11.9*  HCT 41.6 37.2 36.2  MCV 97.0 92.1 90.0  PLT 204 191 162   Lipid Panel: No results for input(s): "CHOL", "HDL", "LDLCALC", "TRIG", "CHOLHDL", "LDLDIRECT" in the last 8760 hours. Lab Results  Component Value Date   HGBA1C 5.60 07/15/2014    Procedures since last visit: DG Chest Portable 1 View  Result Date:  05/11/2022 CLINICAL DATA:  Dyspnea and chest pain EXAM: PORTABLE CHEST 1 VIEW COMPARISON:  10/04/2021 chest radiograph. FINDINGS: Stable cardiomediastinal silhouette with top-normal heart size. No pneumothorax. No pleural effusion. Lungs appear clear, with no acute consolidative airspace disease and no  pulmonary edema. IMPRESSION: No active disease. Electronically Signed   By: Ilona Sorrel M.D.   On: 05/11/2022 08:21    Assessment/Plan  1. Encounter to establish care -  establish care with [facility]  2. Graves disease Lab Results  Component Value Date   TSH <0.010 (L) 05/11/2022   - Ambulatory referral to Endocrinology - methimazole (TAPAZOLE) 10 MG tablet; Take 1 tablet (10 mg total) by mouth 2 (two) times daily.  Dispense: 120 tablet; Refill: 0  3. Primary hypertension -  BP 186/102, recheck BP 180/100 - Lipid panel - Hemoglobin A1C - metoprolol tartrate (LOPRESSOR) 25 MG tablet; Take 0.5 tablets (12.5 mg total) by mouth 2 (two) times daily.  Dispense: 30 tablet; Refill: 5 - amLODipine (NORVASC) 5 MG tablet; Take 1 tablet (5 mg total) by mouth daily.  Dispense: 30 tablet; Refill: 0 - hydrochlorothiazide (HYDRODIURIL) 12.5 MG tablet; Take 1 tablet (12.5 mg total) by mouth daily.  Dispense: 30 tablet; Refill: 0  4. Gastroesophageal reflux disease without esophagitis -  continue Omeprazole  5. Chronic midline low back pain without sciatica - cyclobenzaprine (FLEXERIL) 5 MG tablet; Take 1 tablet (5 mg total) by mouth at bedtime as needed for muscle spasms.  Dispense: 30 tablet; Refill: 0  6. Class 1 obesity with body mass index (BMI) of 34.0 to 34.9 in adult, unspecified obesity type, unspecified whether serious comorbidity present Wt Readings from Last 3 Encounters:  05/28/22 250 lb (113.4 kg)  05/11/22 244 lb (110.7 kg)  03/30/22 244 lb (110.7 kg)   Body mass index is 34.87 kg/m. -  discussed appropriate food choices  7. Screening for STD (sexually transmitted disease) - Hepatitis C  Antibody - HIV antibody (with reflex)     Labs/tests ordered:  - Hepatitis C Antibody - HIV antibody (with reflex) - Lipid panel - Hemoglobin A1C   Next appt:  in a week

## 2022-05-29 LAB — LIPID PANEL
Cholesterol: 93 mg/dL (ref <200)
HDL: 36 mg/dL — ABNORMAL LOW (ref >=50)
LDL Cholesterol (Calc): 40 mg/dL (calc)
Non-HDL Cholesterol (Calc): 57 mg/dL (calc) (ref <130)
Total CHOL/HDL Ratio: 2.6 (calc) (ref <5.0)
Triglycerides: 90 mg/dL (ref <150)

## 2022-05-29 LAB — HEPATITIS C ANTIBODY: Hepatitis C Ab: NONREACTIVE

## 2022-05-29 LAB — HEMOGLOBIN A1C
Hgb A1c MFr Bld: 5.4 % of total Hgb (ref <5.7)
Mean Plasma Glucose: 108 mg/dL
eAG (mmol/L): 6 mmol/L

## 2022-05-29 LAB — HIV ANTIBODY (ROUTINE TESTING W REFLEX): HIV 1&2 Ab, 4th Generation: NONREACTIVE

## 2022-05-30 ENCOUNTER — Ambulatory Visit: Payer: BC Managed Care – PPO | Admitting: Family

## 2022-05-30 NOTE — Progress Notes (Signed)
-    lipids are within normal levels, HIV and Hep C negative and A1c 5.4 is normal.

## 2022-06-01 ENCOUNTER — Encounter: Payer: BC Managed Care – PPO | Admitting: Adult Health

## 2022-06-01 NOTE — Progress Notes (Signed)
This encounter was created in error - please disregard.

## 2022-06-04 ENCOUNTER — Other Ambulatory Visit: Payer: Self-pay

## 2022-06-04 ENCOUNTER — Encounter (HOSPITAL_BASED_OUTPATIENT_CLINIC_OR_DEPARTMENT_OTHER): Payer: Self-pay | Admitting: Obstetrics and Gynecology

## 2022-06-04 NOTE — Progress Notes (Signed)
Spoke w/ via phone for pre-op interview---[facility] needs dos----cbc, ISTAT, urine pregnancy               Lab results------05/11/22 chest xray and EKG in Epic, 2018 echocardiogram in [place] test -----patient states asymptomatic no test needed Arrive at -------11 am on Monday, 06/11/2022 NPO after MN NO Solid Food.  Clear liquids from MN until---10 am Med rec completed Medications to take morning of surgery -----Amlodipine, Tapazole, Metoprolol Diabetic medication -----n/a Patient instructed no nail polish to be worn day of surgery Patient instructed to bring photo id and insurance card day of surgery Patient aware to have Driver (ride ) / caregiver    for 24 hours after surgery - Dad, [facility]  Patient Special Instructions -----No alcohol for 24 hours prior to surgery. Do your best not to smoke for 24 hours before surgery.  Pre-Op special Istructions ----- Patient verbalized understanding of instructions that were given at this phone interview. Patient denies shortness of breath, chest pain, fever, cough at this phone interview.  Patient has a hx of Graves disease. She was following with [facility], [place] 10/16/21 (copy in chart.) Patient states her new PCP, Monina Medina-Vargus, NP is referring her to a new endocrinologist. Patient states she has not heard from them yet. 10/13/21 Thyroid US in chart.

## 2022-06-11 ENCOUNTER — Encounter (HOSPITAL_BASED_OUTPATIENT_CLINIC_OR_DEPARTMENT_OTHER): Payer: Self-pay | Admitting: Anesthesiology

## 2022-06-11 ENCOUNTER — Encounter (HOSPITAL_BASED_OUTPATIENT_CLINIC_OR_DEPARTMENT_OTHER): Payer: Self-pay | Admitting: Obstetrics and Gynecology

## 2022-06-11 ENCOUNTER — Encounter (HOSPITAL_BASED_OUTPATIENT_CLINIC_OR_DEPARTMENT_OTHER)
Admission: RE | Disposition: A | Discharge status: Home / Self Care | Payer: Self-pay | Source: Home / Self Care | Attending: Obstetrics and Gynecology

## 2022-06-11 ENCOUNTER — Ambulatory Visit (HOSPITAL_BASED_OUTPATIENT_CLINIC_OR_DEPARTMENT_OTHER)
Admission: RE | Admit: 2022-06-11 | Discharge: 2022-06-11 | Disposition: A | Discharge status: Home / Self Care | Payer: BC Managed Care – PPO | Attending: Obstetrics and Gynecology | Admitting: Obstetrics and Gynecology

## 2022-06-11 DIAGNOSIS — Z9851 Tubal ligation status: Secondary | ICD-10-CM | POA: Insufficient documentation

## 2022-06-11 DIAGNOSIS — I10 Essential (primary) hypertension: Secondary | ICD-10-CM | POA: Diagnosis not present

## 2022-06-11 DIAGNOSIS — G8929 Other chronic pain: Secondary | ICD-10-CM | POA: Diagnosis not present

## 2022-06-11 DIAGNOSIS — F17210 Nicotine dependence, cigarettes, uncomplicated: Secondary | ICD-10-CM | POA: Diagnosis not present

## 2022-06-11 DIAGNOSIS — J45909 Unspecified asthma, uncomplicated: Secondary | ICD-10-CM | POA: Diagnosis not present

## 2022-06-11 DIAGNOSIS — R102 Pelvic and perineal pain: Secondary | ICD-10-CM | POA: Diagnosis not present

## 2022-06-11 DIAGNOSIS — M545 Low back pain, unspecified: Secondary | ICD-10-CM | POA: Diagnosis not present

## 2022-06-11 DIAGNOSIS — Z6833 Body mass index (BMI) 33.0-33.9, adult: Secondary | ICD-10-CM | POA: Insufficient documentation

## 2022-06-11 DIAGNOSIS — N83209 Unspecified ovarian cyst, unspecified side: Secondary | ICD-10-CM | POA: Diagnosis not present

## 2022-06-11 DIAGNOSIS — K219 Gastro-esophageal reflux disease without esophagitis: Secondary | ICD-10-CM | POA: Insufficient documentation

## 2022-06-11 DIAGNOSIS — E669 Obesity, unspecified: Secondary | ICD-10-CM | POA: Diagnosis not present

## 2022-06-11 DIAGNOSIS — Z01818 Encounter for other preprocedural examination: Secondary | ICD-10-CM

## 2022-06-11 HISTORY — DX: Cardiac murmur, unspecified: R01.1

## 2022-06-11 HISTORY — DX: Other chronic pain: G89.29

## 2022-06-11 HISTORY — DX: Presence of spectacles and contact lenses: Z97.3

## 2022-06-11 HISTORY — DX: Essential (primary) hypertension: I10

## 2022-06-11 HISTORY — DX: Gastro-esophageal reflux disease without esophagitis: K21.9

## 2022-06-11 HISTORY — DX: Low back pain, unspecified: M54.50

## 2022-06-11 LAB — POCT I-STAT, CHEM 8
BUN: 19 mg/dL (ref 6–20)
Calcium, Ion: 1.1 mmol/L — ABNORMAL LOW (ref 1.15–1.40)
Chloride: 106 mmol/L (ref 98–111)
Creatinine, Ser: 0.5 mg/dL (ref 0.44–1.00)
Glucose, Bld: 93 mg/dL (ref 70–99)
HCT: 40 % (ref 36.0–46.0)
Hemoglobin: 13.6 g/dL (ref 12.0–15.0)
Potassium: 5.8 mmol/L — ABNORMAL HIGH (ref 3.5–5.1)
Sodium: 139 mmol/L (ref 135–145)
TCO2: 24 mmol/L (ref 22–32)

## 2022-06-11 LAB — CBC
HCT: 39.6 % (ref 36.0–46.0)
Hemoglobin: 13 g/dL (ref 12.0–15.0)
MCH: 28.9 pg (ref 26.0–34.0)
MCHC: 32.8 g/dL (ref 30.0–36.0)
MCV: 88 fL (ref 80.0–100.0)
Platelets: 276 10*3/uL (ref 150–400)
RBC: 4.5 MIL/uL (ref 3.87–5.11)
RDW: 12.7 % (ref 11.5–15.5)
WBC: 6.7 10*3/uL (ref 4.0–10.5)
nRBC: 0 % (ref 0.0–0.2)

## 2022-06-11 LAB — POCT PREGNANCY, URINE: Preg Test, Ur: NEGATIVE

## 2022-06-11 SURGERY — EXCISION, CYST, OVARY, LAPAROSCOPIC
Anesthesia: Choice

## 2022-06-11 MED ORDER — LACTATED RINGERS IV SOLN: INTRAVENOUS | Status: DC

## 2022-06-11 MED ORDER — POVIDONE-IODINE 10 % EX SWAB: 2.0000 | Freq: Once | CUTANEOUS | Status: DC

## 2022-06-11 SURGICAL SUPPLY — 15 items
APL SRG 38 LTWT LNG FL B (MISCELLANEOUS)
APPLICATOR ARISTA FLEXITIP XL (MISCELLANEOUS) IMPLANT
BARRIER ADHS 3X4 INTERCEED (GAUZE/BANDAGES/DRESSINGS) IMPLANT
BRR ADH 4X3 ABS CNTRL BYND (GAUZE/BANDAGES/DRESSINGS)
DRSG OPSITE POSTOP 3X4 (GAUZE/BANDAGES/DRESSINGS) IMPLANT
DRSG VASELINE 3X18 (GAUZE/BANDAGES/DRESSINGS) IMPLANT
FORCEPS CUTTING 45CM 5MM (CUTTING FORCEPS) IMPLANT
GAUZE 4X4 16PLY ~~LOC~~+RFID DBL (SPONGE) IMPLANT
HEMOSTAT ARISTA ABSORB 3G PWDR (HEMOSTASIS) IMPLANT
MANIPULATOR UTERINE 4.5 ZUMI (MISCELLANEOUS) IMPLANT
NS IRRIG 1000ML POUR BTL (IV SOLUTION) IMPLANT
SCISSORS LAP 5X35 DISP (ENDOMECHANICALS) IMPLANT
SUT VICRYL 0 ENDOLOOP (SUTURE) IMPLANT
SYS BAG RETRIEVAL 10MM (BASKET)
SYSTEM BAG RETRIEVAL 10MM (BASKET) IMPLANT

## 2022-06-11 NOTE — H&P (Signed)
Anne [name] is an 42 y.o. female. Presenting with pelvic pain and a large ovarian cyst which is thought to cause the pain  Pertinent Gynecological History: Menses: flow is moderate Bleeding:  Contraception:  BTL DES exposure: denies Blood transfusions: none Sexually transmitted diseases: no past history Previous GYN Procedures:  SALPINGECTOMY FOR ECTOPIC    Last mammogram: normal Date: 2022 Last pap:  ASCUS HRHPV NEG  Date: 2023 OB History: G2, P1011   Menstrual History: Menarche age: 56 Patient's last menstrual period was 05/19/2022 (exact date).    Past Medical History:  Diagnosis Date   Abnormal echocardiogram    a. 11/2016 - increased LVOT gradient. see 01/09/2017 cardiology OV in Epic   Asthma    rarely uses, only when sick   Chronic midline low back pain without sciatica    off and on   COVID-19 05/11/2022   given Paxlovid, pt stated she did not take due to being unable to swallow pills   GERD (gastroesophageal reflux disease)    Graves disease    Follows w/ Dr. Buddy Duty, endocronologist with [facility].   Heart murmur    12/14/16 echocardiogram in Epic   Hypertension    Follows w/ PCP, Monina Medina-Vargus, NP.   Mild mitral regurgitation    12/14/16 echo in Epic   Normocytic anemia    Obesity    Thyrotoxicosis 11/2014   Tobacco abuse    As of 2024, smoking hx of 20 years.   Tricuspid regurgitation    12/14/16 echo in [place]   Wears glasses     Past Surgical History:  Procedure Laterality Date   ECTOPIC PREGNANCY SURGERY  2012   MANDIBLE SURGERY     around 2002   TUBAL LIGATION     one tube, ruptured during ectopic pregancy    Family History  Problem Relation Age of Onset   Thyroid disease Mother    Cervical cancer Mother        passed   Hypertension Father    Diabetes Maternal Grandmother    Thyroid disease Maternal Grandmother    Diabetes Maternal Aunt    Diabetes Maternal Aunt    Kidney disease Maternal Aunt    Heart Problems Maternal Aunt      Social History:  reports that she has been smoking cigarettes. She has a 20.00 pack-year smoking history. She has never used smokeless tobacco. She reports current alcohol use of about 1.0 standard drink of alcohol per week. She reports that she does not use drugs.  Allergies:  Allergies  Allergen Reactions   Food Allergy Formula Anaphylaxis and Swelling    coconut   Shellfish Allergy Swelling    No medications prior to admission.    Review of Systems  Weight 110.7 kg, last menstrual period 05/19/2022. Physical Exam Physical Examination: General appearance - alert, well appearing, and in no distress Chest - clear to auscultation, no wheezes, rales or rhonchi, symmetric air entry Heart - normal rate and regular rhythm Abdomen - soft, nontender, nondistended, no masses or organomegaly Pelvic - normal external genitalia, vulva, vagina, cervix, uterus and adnexa Extremities - peripheral pulses normal, no pedal edema, no clubbing or cyanosis, Homan's sign negative bilaterally   No results found for this or any previous visit (from the past 24 hour(s)).  No results found.  Assessment/Plan: B ovarian cysts seen on [place] . Plan L/S B cystectomy.  May need Ex lap.  Pt understands the risks are but not limited to bleeding, infection , damage  to internal organs and the need for a blood transfusion  Hassell Patras A Zabrina Brotherton 06/11/2022, 10:18 AM

## 2022-06-11 NOTE — Progress Notes (Signed)
Anesthesia note: Echocardiogram done in 2018, at the time of her thyroid storm, showed a mean LVOT gradient of 46mHg. CT recommended, but at cardiologist office visit they felt this wasn't necessary and attributed the gradient to the thyroid storm. The recommended returning to clinic, but it appears she hasn't not been seen since. She does have a harsh systolic murmur. She also endorses some DOE and lower ext pitting edema for which she wears compression stockings. I recommended to Dr. DCharlesetta Garibaldithat Ms. MOnanbe referred back to cardiology prior to elective surgery for risk stratification and optimization.

## 2022-06-11 NOTE — Anesthesia Preprocedure Evaluation (Signed)
Anesthesia Evaluation    Reviewed: Allergy & Precautions, Patient's Chart, lab work & pertinent test results, reviewed documented beta blocker date and time , Unable to perform ROS - Chart review only  Airway        Dental   Pulmonary shortness of breath and with exertion, asthma , Current Smoker and Patient abstained from smoking.          Cardiovascular hypertension, Pt. on medications and Pt. on home beta blockers + Valvular Problems/Murmurs AS  + Systolic murmurs Echo [id number] - Left ventricle: The cavity size was normal. Systolic function was   vigorous. The estimated ejection fraction was in the range of 65%   to 70%. Wall motion was normal; there were no regional wall   motion abnormalities. Left ventricular diastolic function  parameters were normal.  - Aortic valve: Trileaflet; normal thickness leaflets. Mean  gradient (S): 37 mm Hg. Peak gradient (S): 69 mm Hg. Valve area (VTI): 0.94 cm^2. Valve area (Vmax): 0.89 cm^2. Valve area (Vmean): 1.04 cm^2.  - Mitral valve: There was mild regurgitation.  - Left atrium: The atrium was mildly dilated.  - Right ventricle: The cavity size was normal. Wall thickness was normal. Systolic function was normal.  - Right atrium: The atrium was normal in size.  - Tricuspid valve: There was moderate regurgitation.  - Pulmonic valve: There was no regurgitation.  - Pulmonary arteries: Systolic pressure was within the normal range.  - Inferior vena cava: The vessel was normal in size.  - Pericardium, extracardiac: There was no pericardial effusion.   Impressions:  - There is significant gradient across LVOT, aortic valve opens well. There is a possible subvalvular membrane. Further evaluation with cardiac CTA is recommended.    Neuro/Psych negative neurological ROS     GI/Hepatic Neg liver ROS,GERD  ,,  Endo/Other  negative endocrine ROS    Renal/GU negative Renal ROS      Musculoskeletal negative musculoskeletal ROS (+)    Abdominal  (+) + obese  Peds  Hematology  (+) Blood dyscrasia, anemia   Anesthesia Other Findings   Reproductive/Obstetrics                              Anesthesia Physical Anesthesia Plan  ASA: 4  Anesthesia Plan:    Post-op Pain Management:    Induction:   PONV Risk Score and Plan:   Airway Management Planned:   Additional Equipment:   Intra-op Plan:   Post-operative Plan:   Informed Consent:   Plan Discussed with:   Anesthesia Plan Comments:          Anesthesia Quick Evaluation

## 2022-06-12 ENCOUNTER — Ambulatory Visit (INDEPENDENT_AMBULATORY_CARE_PROVIDER_SITE_OTHER): Payer: BC Managed Care – PPO | Admitting: Adult Health

## 2022-06-12 ENCOUNTER — Encounter: Payer: Self-pay | Admitting: Adult Health

## 2022-06-12 VITALS — BP 118/78 | HR 73 | Temp 97.8°F | Ht 71.0 in | Wt 243.0 lb

## 2022-06-12 DIAGNOSIS — Z01810 Encounter for preprocedural cardiovascular examination: Secondary | ICD-10-CM | POA: Diagnosis not present

## 2022-06-12 DIAGNOSIS — Z01818 Encounter for other preprocedural examination: Secondary | ICD-10-CM

## 2022-06-12 DIAGNOSIS — E0501 Thyrotoxicosis with diffuse goiter with thyrotoxic crisis or storm: Secondary | ICD-10-CM | POA: Diagnosis not present

## 2022-06-12 NOTE — Patient Instructions (Signed)
We have setup a cardiology and endocrinology consults.

## 2022-06-12 NOTE — Progress Notes (Signed)
Location:  [facility]   Place of Service:   clinic    CODE STATUS: full   Allergies  Allergen Reactions   Food Allergy Formula Anaphylaxis and Swelling    coconut   Shellfish Allergy Swelling    Chief Complaint  Patient presents with   Acute Visit    Patient presents today for a cardiology referral.    HPI:  She has a cyst on ovaries with laparoscopy. The anaesthesia did not want to put her to sleep due to "leaky valve". She does have a murmur. She does have some chest pain. She had a cardiology appointment back in 2018; but did not have a follow up after that. She has graves disease and has not been seen by endocrinology.     Past Medical History:  Diagnosis Date   Abnormal echocardiogram    a. 11/2016 - increased LVOT gradient. see 01/09/2017 cardiology OV in Epic   Asthma    rarely uses, only when sick   Chronic midline low back pain without sciatica    off and on   COVID-19 05/11/2022   given Paxlovid, pt stated she did not take due to being unable to swallow pills   GERD (gastroesophageal reflux disease)    Graves disease    Follows w/ Dr. Buddy Duty, endocronologist with [facility].   Heart murmur    12/14/16 echocardiogram in Epic   Hypertension    Follows w/ PCP, Monina Medina-Vargus, NP.   Mild mitral regurgitation    12/14/16 echo in Epic   Normocytic anemia    Obesity    Thyrotoxicosis 11/2014   Tobacco abuse    As of 2024, smoking hx of 20 years.   Tricuspid regurgitation    12/14/16 echo in [place]   Wears glasses     Past Surgical History:  Procedure Laterality Date   ECTOPIC PREGNANCY SURGERY  2012   MANDIBLE SURGERY     around 2002   TUBAL LIGATION     one tube, ruptured during ectopic pregancy    Social History   Socioeconomic History   Marital status: Divorced    Spouse name: Not on file   Number of children: Not on file   Years of education: Not on file   Highest education level: Not on file  Occupational History   Not on  file  Tobacco Use   Smoking status: Every Day    Packs/day: 1.00    Years: 20.00    Total pack years: 20.00    Types: Cigarettes   Smokeless tobacco: Never  Vaping Use   Vaping Use: Former   Start date: 04/24/2015   Quit date: 04/23/2016  Substance and Sexual Activity   Alcohol use: Yes    Alcohol/week: 1.0 standard drink of alcohol    Types: 1 Glasses of wine per week    Comment: few beers and liquor on the weekend   Drug use: No   Sexual activity: Yes    Birth control/protection: None  Other Topics Concern   Not on file  Social History Narrative   Not on file   Social Determinants of Health   Financial Resource Strain: Not on file  Food Insecurity: Not on file  Transportation Needs: Not on file  Physical Activity: Not on file  Stress: Not on file  Social Connections: Not on file  Intimate Partner Violence: Not on file   Family History  Problem Relation Age of Onset   Thyroid disease Mother  Cervical cancer Mother        passed   Hypertension Father    Diabetes Maternal Grandmother    Thyroid disease Maternal Grandmother    Heart murmur Paternal Grandmother    Diabetes Maternal Aunt    Diabetes Maternal Aunt    Kidney disease Maternal Aunt    Heart Problems Maternal Aunt       VITAL SIGNS BP 118/78   Pulse 73   Temp 97.8 F (36.6 C)   Ht 5' 11"$  (1.803 m)   Wt 243 lb (110.2 kg)   LMP 05/19/2022 (Exact Date)   SpO2 98%   BMI 33.89 kg/m   Outpatient Encounter Medications as of 06/12/2022  Medication Sig   amLODipine (NORVASC) 5 MG tablet Take 1 tablet (5 mg total) by mouth daily.   cyclobenzaprine (FLEXERIL) 5 MG tablet Take 1 tablet (5 mg total) by mouth at bedtime as needed for muscle spasms.   hydrochlorothiazide (HYDRODIURIL) 12.5 MG tablet Take 1 tablet (12.5 mg total) by mouth daily.   methimazole (TAPAZOLE) 10 MG tablet Take 1 tablet (10 mg total) by mouth 2 (two) times daily.   metoprolol tartrate (LOPRESSOR) 25 MG tablet Take 0.5 tablets (12.5  mg total) by mouth 2 (two) times daily.   OMEPRAZOLE PO Take by mouth.   ondansetron (ZOFRAN-ODT) 8 MG disintegrating tablet Take 1 tablet (8 mg total) by mouth every 8 (eight) hours as needed for nausea or vomiting. (Patient not taking: Reported on 05/28/2022)   No facility-administered encounter medications on file as of 06/12/2022.     SIGNIFICANT DIAGNOSTIC EXAMS  Review of Systems  Constitutional:  Negative for malaise/fatigue.  Respiratory:  Negative for cough and shortness of breath.   Cardiovascular:  Positive for chest pain. Negative for palpitations and leg swelling.  Gastrointestinal:  Negative for abdominal pain, constipation and heartburn.  Musculoskeletal:  Negative for back pain, joint pain and myalgias.  Skin: Negative.   Neurological:  Negative for dizziness.  Psychiatric/Behavioral:  The patient is not nervous/anxious.     Physical Exam Constitutional:      General: She is not in acute distress.    Appearance: She is well-developed. She is obese. She is not diaphoretic.  Neck:     Thyroid: Thyromegaly present.     Comments: Has diffuse goiter  Cardiovascular:     Rate and Rhythm: Normal rate and regular rhythm.     Pulses: Normal pulses.     Heart sounds: Murmur heard.  Pulmonary:     Effort: Pulmonary effort is normal. No respiratory distress.     Breath sounds: Normal breath sounds.  Abdominal:     General: Bowel sounds are normal. There is no distension.     Palpations: Abdomen is soft.     Tenderness: There is no abdominal tenderness.  Musculoskeletal:        General: Normal range of motion.     Cervical back: Neck supple.     Right lower leg: No edema.     Left lower leg: No edema.  Lymphadenopathy:     Cervical: No cervical adenopathy.  Skin:    General: Skin is warm and dry.  Neurological:     Mental Status: She is alert and oriented to person, place, and time.      ASSESSMENT/ PLAN:  TODAY  Pre-operative clearance Thyrotoxicosis with  diffuse goiter  Will setup cardiology appointment for surgical clearance Will setup endocrinology consult  She has been instructed that if she has not heard  back regarding these appointments in the next week to please call for update. She has verbalized understanding.    Ok Edwards NP [facility]  call [phone]

## 2022-06-14 ENCOUNTER — Ambulatory Visit: Payer: BC Managed Care – PPO | Attending: Cardiology | Admitting: Cardiology

## 2022-06-14 ENCOUNTER — Encounter: Payer: Self-pay | Admitting: Cardiology

## 2022-06-14 VITALS — BP 136/78 | HR 78 | Ht 71.0 in | Wt 242.0 lb

## 2022-06-14 DIAGNOSIS — Z01812 Encounter for preprocedural laboratory examination: Secondary | ICD-10-CM

## 2022-06-14 DIAGNOSIS — R0609 Other forms of dyspnea: Secondary | ICD-10-CM

## 2022-06-14 DIAGNOSIS — Q244 Congenital subaortic stenosis: Secondary | ICD-10-CM | POA: Diagnosis not present

## 2022-06-14 DIAGNOSIS — R072 Precordial pain: Secondary | ICD-10-CM

## 2022-06-14 DIAGNOSIS — F17200 Nicotine dependence, unspecified, uncomplicated: Secondary | ICD-10-CM

## 2022-06-14 MED ORDER — METOPROLOL TARTRATE 100 MG PO TABS: ORAL_TABLET | ORAL | 0 refills | Status: DC

## 2022-06-14 NOTE — Progress Notes (Signed)
Cardiology Office Note:    Date:  06/14/2022   ID:  Anne [name], DOB 1980-09-09, MRN [id number]  PCP:  Nickola Major, NP  Cardiologist:  Berniece Salines, DO  Electrophysiologist:  None   Referring MD: Gerlene Fee, NP   " I am having chest pain"   History of Present Illness:    Anne [name] is a 42 y.o. female with a hx of GERD, Graves' disease, hypertension, mitral regurgitation, smoker here today to be evaluated for chest discomfort.  She tells me that she has been experiencing intermittent chest discomfort.  She described as a midsternal sensation.  Comes and goes.  Nothing makes it better or worse.  Along with that she gets significantly short of breath.  She was asked to see cardiology after her procedure was aborted by anesthesiology given her previous echo report suspecting soft aortic membrane.  Past Medical History:  Diagnosis Date   Abnormal echocardiogram    a. 11/2016 - increased LVOT gradient. see 01/09/2017 cardiology OV in [facility]   Asthma    rarely uses, only when sick   Chronic midline low back pain without sciatica    off and on   COVID-19 05/11/2022   given Paxlovid, pt stated she did not take due to being unable to swallow pills   GERD (gastroesophageal reflux disease)    Graves disease    Follows w/ Dr. Buddy Duty, endocronologist with [facility].   Heart murmur    12/14/16 echocardiogram in [facility]   Hypertension    Follows w/ PCP, Monina Medina-Vargus, NP.   Mild mitral regurgitation    12/14/16 echo in Epic   Normocytic anemia    Obesity    Thyrotoxicosis 11/2014   Tobacco abuse    As of 2024, smoking hx of 20 years.   Tricuspid regurgitation    12/14/16 echo in [place]   Wears glasses     Past Surgical History:  Procedure Laterality Date   ECTOPIC PREGNANCY SURGERY  2012   MANDIBLE SURGERY     around 2002   TUBAL LIGATION     one tube, ruptured during ectopic pregancy    Current Medications: Current Meds  Medication Sig    amLODipine (NORVASC) 5 MG tablet Take 1 tablet (5 mg total) by mouth daily.   cyclobenzaprine (FLEXERIL) 5 MG tablet Take 1 tablet (5 mg total) by mouth at bedtime as needed for muscle spasms.   hydrochlorothiazide (HYDRODIURIL) 12.5 MG tablet Take 1 tablet (12.5 mg total) by mouth daily.   methimazole (TAPAZOLE) 10 MG tablet Take 1 tablet (10 mg total) by mouth 2 (two) times daily.   metoprolol tartrate (LOPRESSOR) 100 MG tablet Take 2 hours prior to CT   metoprolol tartrate (LOPRESSOR) 25 MG tablet Take 0.5 tablets (12.5 mg total) by mouth 2 (two) times daily.   [DISCONTINUED] atenolol (TENORMIN) 25 MG tablet TAKE 2 TABLET BY MOUTH EVERY DAY FOR 30 DAYS     Allergies:   Food allergy formula and Shellfish allergy   Social History   Socioeconomic History   Marital status: Divorced    Spouse name: Not on file   Number of children: Not on file   Years of education: Not on file   Highest education level: Not on file  Occupational History   Not on file  Tobacco Use   Smoking status: Every Day    Packs/day: 1.00    Years: 20.00    Total pack years: 20.00    Types: Cigarettes  Smokeless tobacco: Never  Vaping Use   Vaping Use: Former   Start date: 04/24/2015   Quit date: 04/23/2016  Substance and Sexual Activity   Alcohol use: Yes    Alcohol/week: 1.0 standard drink of alcohol    Types: 1 Glasses of wine per week    Comment: few beers and liquor on the weekend   Drug use: No   Sexual activity: Yes    Birth control/protection: None  Other Topics Concern   Not on file  Social History Narrative   Not on file   Social Determinants of Health   Financial Resource Strain: Not on file  Food Insecurity: Not on file  Transportation Needs: Not on file  Physical Activity: Not on file  Stress: Not on file  Social Connections: Not on file     Family History: The patient's family history includes Cervical cancer in her mother; Diabetes in her maternal aunt, maternal aunt, and maternal  grandmother; Heart Problems in her maternal aunt; Heart murmur in her paternal grandmother; Hypertension in her father; Kidney disease in her maternal aunt; Thyroid disease in her maternal grandmother and mother.  ROS:   Review of Systems  Constitution: Negative for decreased appetite, fever and weight gain.  HENT: Negative for congestion, ear discharge, hoarse voice and sore throat.   Eyes: Negative for discharge, redness, vision loss in right eye and visual halos.  Cardiovascular: Reports chest pain, dyspnea on exertion.negative for leg swelling, orthopnea and palpitations.  Respiratory: Negative for cough, hemoptysis, shortness of breath and snoring.   Endocrine: Negative for heat intolerance and polyphagia.  Hematologic/Lymphatic: Negative for bleeding problem. Does not bruise/bleed easily.  Skin: Negative for flushing, nail changes, rash and suspicious lesions.  Musculoskeletal: Negative for arthritis, joint pain, muscle cramps, myalgias, neck pain and stiffness.  Gastrointestinal: Negative for abdominal pain, bowel incontinence, diarrhea and excessive appetite.  Genitourinary: Negative for decreased libido, genital sores and incomplete emptying.  Neurological: Negative for brief paralysis, focal weakness, headaches and loss of balance.  Psychiatric/Behavioral: Negative for altered mental status, depression and suicidal ideas.  Allergic/Immunologic: Negative for HIV exposure and persistent infections.    EKGs/Labs/Other Studies Reviewed:    The following studies were reviewed today:   EKG:  The ekg ordered today demonstrates   TTE 12/14/2016 Study Conclusions  - Left ventricle: The cavity size was normal. Systolic function was    vigorous. The estimated ejection fraction was in the range of 65%    to 70%. Wall motion was normal; there were no regional wall    motion abnormalities. Left ventricular diastolic function    parameters were normal.  - Aortic valve: Trileaflet; normal  thickness leaflets. Mean    gradient (S): 37 mm Hg. Peak gradient (S): 69 mm Hg. Valve area    (VTI): 0.94 cm^2. Valve area (Vmax): 0.89 cm^2. Valve area    (Vmean): 1.04 cm^2.  - Mitral valve: There was mild regurgitation.  - Left atrium: The atrium was mildly dilated.  - Right ventricle: The cavity size was normal. Wall thickness was    normal. Systolic function was normal.  - Right atrium: The atrium was normal in size.  - Tricuspid valve: There was moderate regurgitation.  - Pulmonic valve: There was no regurgitation.  - Pulmonary arteries: Systolic pressure was within the normal    range.  - Inferior vena cava: The vessel was normal in size.  - Pericardium, extracardiac: There was no pericardial effusion.   Recent Labs: 05/11/2022: ALT 39; TSH <0.010  06/11/2022: BUN 19; Creatinine, Ser 0.50; Hemoglobin 13.6; Platelets 276; Potassium 5.8; Sodium 139  Recent Lipid Panel    Component Value Date/Time   CHOL 93 05/28/2022 1101   TRIG 90 05/28/2022 1101   HDL 36 (L) 05/28/2022 1101   CHOLHDL 2.6 05/28/2022 1101   LDLCALC 40 05/28/2022 1101    Physical Exam:    VS:  BP 136/78   Pulse 78   Ht 5' 11"$  (1.803 m)   Wt 109.8 kg   LMP 05/19/2022 (Exact Date)   SpO2 100%   BMI 33.75 kg/m     Wt Readings from Last 3 Encounters:  06/14/22 109.8 kg  06/12/22 110.2 kg  06/11/22 107.4 kg     GEN: Well nourished, well developed in no acute distress HEENT: Normal NECK: No JVD; No carotid bruits LYMPHATICS: No lymphadenopathy CARDIAC: S1S2 noted,RRR, no murmurs, rubs, gallops RESPIRATORY:  Clear to auscultation without rales, wheezing or rhonchi  ABDOMEN: Soft, non-tender, non-distended, +bowel sounds, no guarding. EXTREMITIES: No edema, No cyanosis, no clubbing MUSCULOSKELETAL:  No deformity  SKIN: Warm and dry NEUROLOGIC:  Alert and oriented x 3, non-focal PSYCHIATRIC:  Normal affect, good insight  ASSESSMENT:    1. Precordial pain   2. DOE (dyspnea on exertion)   3.  Pre-procedure lab exam   4. Subaortic membrane   5. Current smoker   6. Morbid obesity ([place])    PLAN:    The symptoms chest pain is concerning, this patient does have intermediate risk for coronary artery disease and at this time I would like to pursue an ischemic evaluation in this patient.  Shared decision a coronary CTA at this time is appropriate.  I have discussed with the patient about the testing.  The patient has no IV contrast allergy and is agreeable to proceed with this test.  Will repeat an echocardiogram given her significant shortness of breath on exertion.  The patient was counseled on tobacco cessation today for 5 minutes.  Counseling included reviewing the risks of smoking tobacco products, how it impacts the patient's current medical diagnoses and different strategies for quitting.  Pharmacotherapy to aid in tobacco cessation was not prescribed today. The patient coordinate with  primary care provider.  The patient was also advised to call  1-800-QUIT-NOW (812)126-1310) for additional help with quitting smoking.  Will get blood work today.  The patient is in agreement with the above plan. The patient left the office in stable condition.  The patient will follow up in 12 weeks.   Medication Adjustments/Labs and Tests Ordered: Current medicines are reviewed at length with the patient today.  Concerns regarding medicines are outlined above.  Orders Placed This Encounter  Procedures   CT CORONARY MORPH W/CTA COR W/SCORE W/CA W/CM &/OR WO/CM   Magnesium   Basic Metabolic Panel (BMET)   EKG 12-Lead   ECHOCARDIOGRAM COMPLETE   Meds ordered this encounter  Medications   metoprolol tartrate (LOPRESSOR) 100 MG tablet    Sig: Take 2 hours prior to CT    Dispense:  1 tablet    Refill:  0    Patient Instructions  Medication Instructions:  Your physician recommends that you continue on your current medications as directed. Please refer to the Current Medication list given  to you today.  *If you need a refill on your cardiac medications before your next appointment, please call your pharmacy*   Lab Work: Your physician recommends that you return for lab work in: [employer], [place] If you have labs (  blood work) drawn today and your tests are completely normal, you will receive your results only by: [facility] (if you have MyChart) OR A paper copy in the mail If you have any lab test that is abnormal or we need to change your treatment, we will call you to review the results.   Testing/Procedures: Your physician has requested that you have an echocardiogram. Echocardiography is a painless test that uses sound waves to create images of your heart. It provides your doctor with information about the size and shape of your heart and how well your heart's chambers and valves are working. This procedure takes approximately one hour. There are no restrictions for this procedure. Please do NOT wear cologne, perfume, aftershave, or lotions (deodorant is allowed). Please arrive 15 minutes prior to your appointment time.    Your cardiac CT will be scheduled at one of the below locations:   [facility] [address] [place], [place] [address] [phone]   If scheduled at [facility], please arrive at the [facility] and Children's Entrance (Entrance C2) of [facility] 30 minutes prior to test start time. You can use the FREE valet parking offered at entrance C (encouraged to control the heart rate for the test)  Proceed to the [facility] Radiology Department (first floor) to check-in and test prep.  All radiology patients and guests should use entrance C2 at [facility], accessed from [facility], even though the hospital's physical address listed is [address].     Please follow these instructions carefully (unless otherwise directed):   On the Night Before the Test: Be sure to Drink plenty of  water. Do not consume any caffeinated/decaffeinated beverages or chocolate 12 hours prior to your test. Do not take any antihistamines 12 hours prior to your test.  On the Day of the Test: Drink plenty of water until 1 hour prior to the test. Do not eat any food 1 hour prior to test. You may take your regular medications prior to the test.  Take metoprolol (Lopressor) two hours prior to test. Please HOLD Amlodipine/Hydrochlorothiazide/Metoprolol 67m on the morning of the test. FEMALES- please wear underwire-free bra if available, avoid dresses & tight clothing      After the Test: Drink plenty of water. After receiving IV contrast, you may experience a mild flushed feeling. This is normal. On occasion, you may experience a mild rash up to 24 hours after the test. This is not dangerous. If this occurs, you can take Benadryl 25 mg and increase your fluid intake. If you experience trouble breathing, this can be serious. If it is severe call 911 IMMEDIATELY. If it is mild, please call our office. If you take any of these medications: Glipizide/Metformin, Avandament, Glucavance, please do not take 48 hours after completing test unless otherwise instructed.  We will call to schedule your test 2-4 weeks out understanding that some insurance companies will need an authorization prior to the service being performed.   For non-scheduling related questions, please contact the cardiac imaging nurse navigator should you have any questions/concerns: SMarchia Bond Cardiac Imaging Nurse Navigator MGordy Clement Cardiac Imaging Nurse Navigator [facility] Direct Office Dial: [phone]  For scheduling needs, including cancellations and rescheduling, please call [place] [phone]    Follow-Up: [facility] you and your health needs are our priority.  As part of our continuing mission to provide you with exceptional heart care, we  have created  designated Provider Care Teams.  These Care Teams include your primary Cardiologist (physician) and Advanced Practice Providers (APPs -  Physician Assistants and Nurse Practitioners) who all work together to provide you with the care you need, when you need it.  We recommend signing up for the patient portal called "MyChart".  Sign up information is provided on this After Visit Summary.  MyChart is used to connect with patients for Virtual Visits (Telemedicine).  Patients are able to view lab/test results, encounter notes, upcoming appointments, etc.  Non-urgent messages can be sent to your provider as well.   To learn more about what you can do with MyChart, go to [email or web address].    Your next appointment:   12 week(s)  Provider:   Berniece Salines, DO    Other instructions  [email or web address]  Steps to Quit Smoking Smoking tobacco is the leading cause of preventable death. It can affect almost every organ in the body. Smoking puts you and people around you at risk for many serious, long-lasting (chronic) diseases. Quitting smoking can be hard, but it is one of the best things that you can do for your health. It is never too late to quit. Do not give up if you cannot quit the first time. Some people need to try many times to quit. Do your best to stick to your quit plan, and talk with your doctor if you have any questions or concerns. How do I get ready to quit? Pick a date to quit. Set a date within the next 2 weeks to give you time to prepare. Write down the reasons why you are quitting. Keep this list in places where you will see it often. Tell your family, friends, and co-workers that you are quitting. Their support is important. Talk with your doctor about the choices that may help you quit. Find out if your health insurance will pay for these treatments. Know the people, places, things, and activities that make you want to smoke (triggers). Avoid them. What first steps can I take to quit  smoking? Throw away all cigarettes at home, at work, and in your car. Throw away the things that you use when you smoke, such as ashtrays and lighters. Clean your car. Empty the ashtray. Clean your home, including curtains and carpets. What can I do to help me quit smoking? Talk with your doctor about taking medicines and seeing a counselor. You are more likely to succeed when you do both. If you are pregnant or breastfeeding: Talk with your doctor about counseling or other ways to quit smoking. Do not take medicine to help you quit smoking unless your doctor tells you to. Quit right away Quit smoking completely, instead of slowly cutting back on how much you smoke over a period of time. Stopping smoking right away may be more successful than slowly quitting. Go to counseling. In-person is best if this is an option. You are more likely to quit if you go to counseling sessions regularly. Take medicine You may take medicines to help you quit. Some medicines need a prescription, and some you can buy over-the-counter. Some medicines may contain a drug called nicotine to replace the nicotine in cigarettes. Medicines may: Help you stop having the desire to smoke (cravings). Help to stop the problems that come when you stop smoking (withdrawal symptoms). Your doctor may ask you to use: Nicotine patches, gum, or lozenges. Nicotine inhalers or sprays. Non-nicotine medicine that you take by mouth. Find resources Find resources  and other ways to help you quit smoking and remain smoke-free after you quit. They include: Online chats with a Social worker. Phone quitlines. Printed Furniture conservator/restorer. Support groups or group counseling. Text messaging programs. Mobile phone apps. Use apps on your mobile phone or tablet that can help you stick to your quit plan. Examples of free services include Quit Guide from the CDC and smokefree.gov  What can I do to make it easier to quit?  Talk to your family and  friends. Ask them to support and encourage you. Call a phone quitline, such as 1-800-QUIT-NOW, reach out to support groups, or work with a Social worker. Ask people who smoke to not smoke around you. Avoid places that make you want to smoke, such as: Bars. Parties. Smoke-break areas at work. Spend time with people who do not smoke. Lower the stress in your life. Stress can make you want to smoke. Try these things to lower stress: Getting regular exercise. Doing deep-breathing exercises. Doing yoga. Meditating. What benefits will I see if I quit smoking? Over time, you may have: A better sense of smell and taste. Less coughing and sore throat. A slower heart rate. Lower blood pressure. Clearer skin. Better breathing. Fewer sick days. Summary Quitting smoking can be hard, but it is one of the best things that you can do for your health. Do not give up if you cannot quit the first time. Some people need to try many times to quit. When you decide to quit smoking, make a plan to help you succeed. Quit smoking right away, not slowly over a period of time. When you start quitting, get help and support to keep you smoke-free. This information is not intended to replace advice given to you by your health care provider. Make sure you discuss any questions you have with your health care provider. Document Revised: 03/31/2021 Document Reviewed: 03/31/2021 Elsevier Patient Education  [place].  [email or web address]   Adopting a Healthy Lifestyle.  Know what a healthy weight is for you (roughly BMI <25) and aim to maintain this   Aim for 7+ servings of fruits and vegetables daily   65-80+ fluid ounces of water or unsweet tea for healthy kidneys   Limit to max 1 drink of alcohol per day; avoid smoking/tobacco   Limit animal fats in diet for cholesterol and heart health - choose grass fed whenever available   Avoid  highly processed foods, and foods high in saturated/trans fats   Aim for low stress - take time to unwind and care for your mental health   Aim for 150 min of moderate intensity exercise weekly for heart health, and weights twice weekly for bone health   Aim for 7-9 hours of sleep daily   When it comes to diets, agreement about the perfect plan isnt easy to find, even among the experts. Experts at the [employer] developed an idea known as the Healthy Eating Plate. Just imagine a plate divided into logical, healthy portions.   The emphasis is on diet quality:   Load up on vegetables and fruits - one-half of your plate: Aim for color and variety, and remember that potatoes dont count.   Go for whole grains - one-quarter of your plate: Whole wheat, barley, wheat berries, quinoa, oats, brown rice, and foods made with them. If you want pasta, go with whole wheat pasta.   Protein power - one-quarter of your plate: Fish, chicken, beans, and nuts are all healthy, versatile protein  sources. Limit red meat.   The diet, however, does go beyond the plate, offering a few other suggestions.   Use healthy plant oils, such as olive, canola, soy, corn, sunflower and peanut. Check the labels, and avoid partially hydrogenated oil, which have unhealthy trans fats.   If youre thirsty, drink water. Coffee and tea are good in moderation, but skip sugary drinks and limit milk and dairy products to one or two daily servings.   The type of carbohydrate in the diet is more important than the amount. Some sources of carbohydrates, such as vegetables, fruits, whole grains, and beans-are healthier than others.   Finally, stay active  Signed, Berniece Salines, DO  06/14/2022 5:11 PM    [facility]

## 2022-06-14 NOTE — Patient Instructions (Addendum)
Medication Instructions:  Your physician recommends that you continue on your current medications as directed. Please refer to the Current Medication list given to you today.  *If you need a refill on your cardiac medications before your next appointment, please call your pharmacy*   Lab Work: Your physician recommends that you return for lab work in: [employer], [place] If you have labs (blood work) drawn today and your tests are completely normal, you will receive your results only by: MyChart Message (if you have MyChart) OR A paper copy in the mail If you have any lab test that is abnormal or we need to change your treatment, we will call you to review the results.   Testing/Procedures: Your physician has requested that you have an echocardiogram. Echocardiography is a painless test that uses sound waves to create images of your heart. It provides your doctor with information about the size and shape of your heart and how well your heart's chambers and valves are working. This procedure takes approximately one hour. There are no restrictions for this procedure. Please do NOT wear cologne, perfume, aftershave, or lotions (deodorant is allowed). Please arrive 15 minutes prior to your appointment time.    Your cardiac CT will be scheduled at one of the below locations:   [facility] [address] [place], [place] [address] [phone]   If scheduled at [facility], please arrive at the [facility] and Children's Entrance (Entrance C2) of [facility] 30 minutes prior to test start time. You can use the FREE valet parking offered at entrance C (encouraged to control the heart rate for the test)  Proceed to the [facility] Radiology Department (first floor) to check-in and test prep.  All radiology patients and guests should use entrance C2 at [facility], accessed from [facility], even though the hospital's physical address listed is [address]     Please follow these instructions carefully (unless otherwise directed):   On the Night Before the Test: Be sure to Drink plenty of water. Do not consume any caffeinated/decaffeinated beverages or chocolate 12 hours prior to your test. Do not take any antihistamines 12 hours prior to your test.  On the Day of the Test: Drink plenty of water until 1 hour prior to the test. Do not eat any food 1 hour prior to test. You may take your regular medications prior to the test.  Take metoprolol (Lopressor) two hours prior to test. Please HOLD Amlodipine/Hydrochlorothiazide/Metoprolol 80m on the morning of the test. FEMALES- please wear underwire-free bra if available, avoid dresses & tight clothing      After the Test: Drink plenty of water. After receiving IV contrast, you may experience a mild flushed feeling. This is normal. On occasion, you may experience a mild rash up to 24 hours after the test. This is not dangerous. If this occurs, you can take Benadryl 25 mg and increase your fluid intake. If you experience trouble breathing, this can be serious. If it is severe call 911 IMMEDIATELY. If it is mild, please call our office. If you take any of these medications: Glipizide/Metformin, Avandament, Glucavance, please do not take 48 hours after completing test unless otherwise instructed.  We will call to schedule your test 2-4 weeks out understanding that some insurance companies will need an authorization prior to the service being performed.   For non-scheduling related questions, please contact the cardiac imaging nurse navigator should you have any questions/concerns: SMarchia Bond  Cardiac Imaging Nurse Navigator Gordy Clement, Cardiac Imaging Nurse Navigator [facility] Direct Office Dial: [phone]   For scheduling needs, including cancellations and rescheduling, please call [place], [phone].    Follow-Up: At [facility], you and your health needs are our priority.  As part of our continuing mission to provide you with exceptional heart care, we have created designated Provider Care Teams.  These Care Teams include your primary Cardiologist (physician) and Advanced Practice Providers (APPs -  Physician Assistants and Nurse Practitioners) who all work together to provide you with the care you need, when you need it.  We recommend signing up for the patient portal called "MyChart".  Sign up information is provided on this After Visit Summary.  MyChart is used to connect with patients for Virtual Visits (Telemedicine).  Patients are able to view lab/test results, encounter notes, upcoming appointments, etc.  Non-urgent messages can be sent to your provider as well.   To learn more about what you can do with MyChart, go to [email or web address].    Your next appointment:   12 week(s)  Provider:   Berniece Salines, DO    Other instructions  [email or web address]  Steps to Quit Smoking Smoking tobacco is the leading cause of preventable death. It can affect almost every organ in the body. Smoking puts you and people around you at risk for many serious, long-lasting (chronic) diseases. Quitting smoking can be hard, but it is one of the best things that you can do for your health. It is never too late to quit. Do not give up if you cannot quit the first time. Some people need to try many times to quit. Do your best to stick to your quit plan, and talk with your doctor if you have any questions or concerns. How do I get ready to quit? Pick a date to quit. Set a date within the next 2 weeks to give you time to prepare. Write down the reasons why you are quitting. Keep this list in places where you will see it often. Tell your family, friends, and co-workers that you are quitting. Their support is important. Talk with your doctor about the choices that may help you quit. Find out if your health insurance will pay for these  treatments. Know the people, places, things, and activities that make you want to smoke (triggers). Avoid them. What first steps can I take to quit smoking? Throw away all cigarettes at home, at work, and in your car. Throw away the things that you use when you smoke, such as ashtrays and lighters. Clean your car. Empty the ashtray. Clean your home, including curtains and carpets. What can I do to help me quit smoking? Talk with your doctor about taking medicines and seeing a counselor. You are more likely to succeed when you do both. If you are pregnant or breastfeeding: Talk with your doctor about counseling or other ways to quit smoking. Do not take medicine to help you quit smoking unless your doctor tells you to. Quit right away Quit smoking completely, instead of slowly cutting back on how much you smoke over a period of time. Stopping smoking right away may be more successful than slowly quitting. Go to counseling. In-person is best if this is an option. You are more likely to quit if you go to counseling sessions regularly. Take medicine You may take medicines to help you quit. Some medicines need a prescription, and some you can buy over-the-counter. Some  medicines may contain a drug called nicotine to replace the nicotine in cigarettes. Medicines may: Help you stop having the desire to smoke (cravings). Help to stop the problems that come when you stop smoking (withdrawal symptoms). Your doctor may ask you to use: Nicotine patches, gum, or lozenges. Nicotine inhalers or sprays. Non-nicotine medicine that you take by mouth. Find resources Find resources and other ways to help you quit smoking and remain smoke-free after you quit. They include: Online chats with a Social worker. Phone quitlines. Printed Furniture conservator/restorer. Support groups or group counseling. Text messaging programs. Mobile phone apps. Use apps on your mobile phone or tablet that can help you stick to your quit  plan. Examples of free services include Quit Guide from the CDC and smokefree.gov  What can I do to make it easier to quit?  Talk to your family and friends. Ask them to support and encourage you. Call a phone quitline, such as 1-800-QUIT-NOW, reach out to support groups, or work with a Social worker. Ask people who smoke to not smoke around you. Avoid places that make you want to smoke, such as: Bars. Parties. Smoke-break areas at work. Spend time with people who do not smoke. Lower the stress in your life. Stress can make you want to smoke. Try these things to lower stress: Getting regular exercise. Doing deep-breathing exercises. Doing yoga. Meditating. What benefits will I see if I quit smoking? Over time, you may have: A better sense of smell and taste. Less coughing and sore throat. A slower heart rate. Lower blood pressure. Clearer skin. Better breathing. Fewer sick days. Summary Quitting smoking can be hard, but it is one of the best things that you can do for your health. Do not give up if you cannot quit the first time. Some people need to try many times to quit. When you decide to quit smoking, make a plan to help you succeed. Quit smoking right away, not slowly over a period of time. When you start quitting, get help and support to keep you smoke-free. This information is not intended to replace advice given to you by your health care provider. Make sure you discuss any questions you have with your health care provider. Document Revised: 03/31/2021 Document Reviewed: 03/31/2021 Elsevier Patient Education  [place].  [email or web address]

## 2022-06-15 ENCOUNTER — Ambulatory Visit (INDEPENDENT_AMBULATORY_CARE_PROVIDER_SITE_OTHER): Payer: BC Managed Care – PPO

## 2022-06-15 DIAGNOSIS — R0609 Other forms of dyspnea: Secondary | ICD-10-CM

## 2022-06-15 LAB — BASIC METABOLIC PANEL
BUN/Creatinine Ratio: 19 (ref 9–23)
BUN: 12 mg/dL (ref 6–24)
CO2: 23 mmol/L (ref 20–29)
Calcium: 9.4 mg/dL (ref 8.7–10.2)
Chloride: 103 mmol/L (ref 96–106)
Creatinine, Ser: 0.64 mg/dL (ref 0.57–1.00)
Glucose: 79 mg/dL (ref 70–99)
Potassium: 4.2 mmol/L (ref 3.5–5.2)
Sodium: 140 mmol/L (ref 134–144)
eGFR: 112 mL/min/{1.73_m2} (ref >59)

## 2022-06-15 LAB — ECHOCARDIOGRAM COMPLETE
AR max vel: 1.55 cm2
AV Area VTI: 1.65 cm2
AV Area mean vel: 1.77 cm2
AV Mean grad: 22 mmHg
AV Peak grad: 39.4 mmHg
AV Vena cont: 0.58 cm
Ao pk vel: 3.14 m/s
Area-P 1/2: 3.36 cm2
Calc EF: 62.7 %
P 1/2 time: 452 msec
S' Lateral: 2.64 cm
Single Plane A2C EF: 65.4 %
Single Plane A4C EF: 61.1 %

## 2022-06-15 LAB — MAGNESIUM: Magnesium: 2.1 mg/dL (ref 1.6–2.3)

## 2022-06-19 ENCOUNTER — Other Ambulatory Visit: Payer: Self-pay | Admitting: Adult Health

## 2022-06-19 DIAGNOSIS — I10 Essential (primary) hypertension: Secondary | ICD-10-CM

## 2022-06-19 DIAGNOSIS — E0500 Thyrotoxicosis with diffuse goiter without thyrotoxic crisis or storm: Secondary | ICD-10-CM

## 2022-06-22 ENCOUNTER — Telehealth (HOSPITAL_COMMUNITY): Payer: Self-pay | Admitting: *Deleted

## 2022-06-22 NOTE — Telephone Encounter (Signed)
Patient returning call about her upcoming cardiac imaging study; pt verbalizes understanding of appt date/time, parking situation and where to check in, pre-test NPO status and medications ordered, and verified current allergies; name and call back number provided for further questions should they arise  Anne Clement RN Navigator Cardiac Imaging [facility] [phone] office [phone] cell  Patient to take '100mg'$  metoprolol tartrate two hours prior to her cardiac CT scan. She is aware to arrive at 11am.

## 2022-06-22 NOTE — Telephone Encounter (Signed)
Attempted to call patient regarding upcoming cardiac CT appointment. °Left message on voicemail with name and callback number ° °Daleiza Bacchi RN Navigator Cardiac Imaging °[facility] °[phone] Office °[phone] Cell ° °

## 2022-06-25 ENCOUNTER — Ambulatory Visit (HOSPITAL_COMMUNITY)
Admission: RE | Admit: 2022-06-25 | Discharge: 2022-06-25 | Disposition: A | Discharge status: Home / Self Care | Payer: BC Managed Care – PPO | Source: Ambulatory Visit | Attending: Cardiology | Admitting: Cardiology

## 2022-06-25 DIAGNOSIS — R072 Precordial pain: Secondary | ICD-10-CM | POA: Diagnosis not present

## 2022-06-25 MED ORDER — NITROGLYCERIN 0.4 MG SL SUBL
0.8000 mg | SUBLINGUAL_TABLET | Freq: Once | SUBLINGUAL | Status: AC
  Administered 2022-06-25: 0.8 mg via SUBLINGUAL

## 2022-06-25 MED ORDER — NITROGLYCERIN 0.4 MG SL SUBL
SUBLINGUAL_TABLET | SUBLINGUAL | Status: AC
  Filled 2022-06-25: qty 2

## 2022-06-25 MED ORDER — IOHEXOL 350 MG/ML SOLN
100.0000 mL | Freq: Once | INTRAVENOUS | Status: AC | PRN
  Administered 2022-06-25: 100 mL via INTRAVENOUS

## 2022-07-04 ENCOUNTER — Ambulatory Visit: Payer: BC Managed Care – PPO | Admitting: Internal Medicine

## 2022-07-04 NOTE — Progress Notes (Deleted)
Name: Anne [name]  MRN/ DOB: [id number], [birth year]    Age/ Sex: 42 y.o., female    PCP: Nickola Major, NP   Reason for Endocrinology Evaluation: Hyperthyroidism     Date of Initial Endocrinology Evaluation: 07/04/2022     HPI: Ms. Anne [name] is a 42 y.o. female with a past medical history of ***. The patient presented for initial endocrinology clinic visit on 07/04/2022 for consultative assistance with her Hyperthyroidism.   Patient has been diagnosed with thyromegaly since 2008, this has been confirmed through thyroid ultrasound with no nodules  noted.   She has been noted with low TSH in 2016 at 0.324 u IU/mL.  This has subsequently worsened to a suppressed TSH <0.010 u IU/mL in 2018, and again 04/2022  HISTORY:  Past Medical History:  Past Medical History:  Diagnosis Date   Abnormal echocardiogram    a. 11/2016 - increased LVOT gradient. see 01/09/2017 cardiology OV in Epic   Asthma    rarely uses, only when sick   Chronic midline low back pain without sciatica    off and on   COVID-19 05/11/2022   given Paxlovid, pt stated she did not take due to being unable to swallow pills   GERD (gastroesophageal reflux disease)    Graves disease    Follows w/ Dr. Buddy Duty, endocronologist with [facility].   Heart murmur    12/14/16 echocardiogram in Epic   Hypertension    Follows w/ PCP, Monina Medina-Vargus, NP.   Mild mitral regurgitation    12/14/16 echo in Epic   Normocytic anemia    Obesity    Thyrotoxicosis 11/2014   Tobacco abuse    As of 2024, smoking hx of 20 years.   Tricuspid regurgitation    12/14/16 echo in [place]   Wears glasses    Past Surgical History:  Past Surgical History:  Procedure Laterality Date   ECTOPIC PREGNANCY SURGERY  2012   MANDIBLE SURGERY     around 2002   TUBAL LIGATION     one tube, ruptured during ectopic pregancy    Social History:  reports that she has been smoking cigarettes. She has a 20.00 pack-year smoking  history. She has never used smokeless tobacco. She reports current alcohol use of about 1.0 standard drink of alcohol per week. She reports that she does not use drugs. Family History: family history includes Cervical cancer in her mother; Diabetes in her maternal aunt, maternal aunt, and maternal grandmother; Heart Problems in her maternal aunt; Heart murmur in her paternal grandmother; Hypertension in her father; Kidney disease in her maternal aunt; Thyroid disease in her maternal grandmother and mother.   HOME MEDICATIONS: Allergies as of 07/04/2022       Reactions   Food Allergy Formula Anaphylaxis, Swelling   coconut   Shellfish Allergy Swelling        Medication List        Accurate as of July 04, 2022  7:05 AM. If you have any questions, ask your nurse or doctor.          amLODipine 5 MG tablet Commonly known as: NORVASC Take 1 tablet (5 mg total) by mouth daily.   cyclobenzaprine 5 MG tablet Commonly known as: FLEXERIL Take 1 tablet (5 mg total) by mouth at bedtime as needed for muscle spasms.   hydrochlorothiazide 12.5 MG tablet Commonly known as: HYDRODIURIL TAKE 1 TABLET BY MOUTH EVERY DAY   methimazole 10 MG tablet Commonly known as:  TAPAZOLE TAKE 1 TABLET BY MOUTH TWICE A DAY   metoprolol tartrate 25 MG tablet Commonly known as: LOPRESSOR Take 0.5 tablets (12.5 mg total) by mouth 2 (two) times daily.   metoprolol tartrate 100 MG tablet Commonly known as: LOPRESSOR Take 2 hours prior to CT          REVIEW OF SYSTEMS: A comprehensive ROS was conducted with the patient and is negative except as per HPI and below:  ROS     OBJECTIVE:  VS: There were no vitals taken for this visit.   Wt Readings from Last 3 Encounters:  06/14/22 242 lb (109.8 kg)  06/12/22 243 lb (110.2 kg)  06/11/22 236 lb 11.2 oz (107.4 kg)     EXAM: General: Pt appears well and is in NAD  Eyes: External eye exam normal without stare, lid lag or exophthalmos.  EOM  intact.  PERRL.  Neck: General: Supple without adenopathy. Thyroid: Thyroid size normal.  No goiter or nodules appreciated. No thyroid bruit.  Lungs: Clear with good BS bilat with no rales, rhonchi, or wheezes  Heart: Auscultation: RRR.  Abdomen: Normoactive bowel sounds, soft, nontender, without masses or organomegaly palpable  Extremities:  BL LE: No pretibial edema normal ROM and strength.  Mental Status: Judgment, insight: Intact Orientation: Oriented to time, place, and person Mood and affect: No depression, anxiety, or agitation     DATA REVIEWED: ***    Latest Reference Range & Units 06/14/22 16:13  Sodium 134 - 144 mmol/L 140  Potassium 3.5 - 5.2 mmol/L 4.2  Chloride 96 - 106 mmol/L 103  CO2 20 - 29 mmol/L 23  Glucose 70 - 99 mg/dL 79  BUN 6 - 24 mg/dL 12  Creatinine 0.57 - 1.00 mg/dL 0.64  Calcium 8.7 - 10.2 mg/dL 9.4  BUN/Creatinine Ratio 9 - 23  19  eGFR >59 mL/min/1.73 112  Magnesium 1.6 - 2.3 mg/dL 2.1    ASSESSMENT/PLAN/RECOMMENDATIONS:   Hyperthyroidism:    Medications :  Signed electronically by: Mack Guise, MD  [facility] Endocrinology  [facility] [address] [place], [place] [address] Phone: [phone] FAX: [phone]   CC: [name], Senaida Lange, NP [address] [place] [address] Phone: [phone] Fax: [phone]   Return to Endocrinology clinic as below: Future Appointments  Date Time Provider [place]  07/04/2022 12:10 PM Rexton Greulich, Melanie Crazier, MD [facility] None  07/06/2022  2:00 PM Tobb, Kardie, DO [facility] None  09/06/2022  8:40 AM Tobb, Godfrey Pick, DO [facility] None

## 2022-07-06 ENCOUNTER — Encounter: Payer: Self-pay | Admitting: Cardiology

## 2022-07-06 ENCOUNTER — Telehealth: Payer: BC Managed Care – PPO | Admitting: Cardiology

## 2022-07-06 ENCOUNTER — Telehealth: Payer: Self-pay

## 2022-07-06 NOTE — Telephone Encounter (Signed)
LVM for patient to return call regarding 2pm virtual appointment

## 2022-07-09 ENCOUNTER — Encounter: Payer: Self-pay | Admitting: Cardiology

## 2022-07-09 ENCOUNTER — Ambulatory Visit: Payer: BC Managed Care – PPO | Attending: Cardiology | Admitting: Cardiology

## 2022-07-09 ENCOUNTER — Ambulatory Visit: Payer: BC Managed Care – PPO | Attending: Cardiology

## 2022-07-09 VITALS — BP 130/94 | HR 82 | Ht 71.0 in | Wt 248.2 lb

## 2022-07-09 DIAGNOSIS — I10 Essential (primary) hypertension: Secondary | ICD-10-CM

## 2022-07-09 DIAGNOSIS — R030 Elevated blood-pressure reading, without diagnosis of hypertension: Secondary | ICD-10-CM

## 2022-07-09 DIAGNOSIS — R002 Palpitations: Secondary | ICD-10-CM

## 2022-07-09 MED ORDER — METOPROLOL SUCCINATE ER 25 MG PO TB24: 25.0000 mg | ORAL_TABLET | Freq: Two times a day (BID) | ORAL | 3 refills | Status: DC

## 2022-07-09 NOTE — Patient Instructions (Signed)
Medication Instructions:  Your physician has recommended you make the following change in your medication:  INCREASE: Toprol-XL 25 mg twice daily *If you need a refill on your cardiac medications before your next appointment, please call your pharmacy*   Lab Work: None If you have labs (blood work) drawn today and your tests are completely normal, you will receive your results only by: [place] (if you have MyChart) OR A paper copy in the mail If you have any lab test that is abnormal or we need to change your treatment, we will call you to review the results.   Testing/Procedures: [place]- Long Term Monitor Instructions  Your physician has requested you wear a ZIO patch monitor for 14 days.  This is a single patch monitor. [employer] supplies one patch monitor per enrollment. Additional stickers are not available. Please do not apply patch if you will be having a Nuclear Stress Test,  Echocardiogram, Cardiac CT, MRI, or Chest Xray during the period you would be wearing the  monitor. The patch cannot be worn during these tests. You cannot remove and re-apply the  ZIO XT patch monitor.  Your ZIO patch monitor will be mailed 3 day USPS to your address on file. It may take 3-5 days  to receive your monitor after you have been enrolled.  Once you have received your monitor, please review the enclosed instructions. Your monitor  has already been registered assigning a specific monitor serial # to you.  Billing and Patient Assistance Program Information  We have supplied [employer] with any of your insurance information on file for billing purposes. [employer] offers a sliding scale Patient Assistance Program for patients that do not have  insurance, or whose insurance does not completely cover the cost of the ZIO monitor.  You must apply for the Patient Assistance Program to qualify for this discounted rate.  To apply, please call [employer] at [phone], select option 4, select option 2,  ask to apply for  Patient Assistance Program. Theodore Demark will ask your household income, and how many people  are in your household. They will quote your out-of-pocket cost based on that information.  [employer] will also be able to set up a 1-month, interest-free payment plan if needed.  Applying the monitor   Shave hair from upper left chest.  Hold abrader disc by orange tab. Rub abrader in 40 strokes over the upper left chest as  indicated in your monitor instructions.  Clean area with 4 enclosed alcohol pads. Let dry.  Apply patch as indicated in monitor instructions. Patch will be placed under collarbone on left  side of chest with arrow pointing upward.  Rub patch adhesive wings for 2 minutes. Remove white label marked "1". Remove the white  label marked "2". Rub patch adhesive wings for 2 additional minutes.  While looking in a mirror, press and release button in center of patch. A small green light will  flash 3-4 times. This will be your only indicator that the monitor has been turned on.  Do not shower for the first 24 hours. You may shower after the first 24 hours.  Press the button if you feel a symptom. You will hear a small click. Record Date, Time and  Symptom in the Patient Logbook.  When you are ready to remove the patch, follow instructions on the last 2 pages of Patient  Logbook. Stick patch monitor onto the last page of Patient Logbook.  Place Patient Logbook in the blue and white box.  Use locking tab on box and tape box closed  securely. The blue and white box has prepaid postage on it. Please place it in the mailbox as  soon as possible. Your physician should have your test results approximately 7 days after the  monitor has been mailed back to [facility].  Call [place] at [phone] if you have questions regarding  your ZIO XT patch monitor. Call them immediately if you see an orange light blinking on your  monitor.  If your monitor falls off  in less than 4 days, contact our Monitor department at [phone].  If your monitor becomes loose or falls off after 4 days call [employer] at [phone] for  suggestions on securing your monitor     Follow-Up: At [facility], you and your health needs are our priority.  As part of our continuing mission to provide you with exceptional heart care, we have created designated Provider Care Teams.  These Care Teams include your primary Cardiologist (physician) and Advanced Practice Providers (APPs -  Physician Assistants and Nurse Practitioners) who all work together to provide you with the care you need, when you need it.   Your next appointment:   4 month(s)  Provider:   Berniece Salines, DO

## 2022-07-09 NOTE — Progress Notes (Unsigned)
Cardiology Office Note:    Date:  07/11/2022   ID:  Anne [name], DOB [birth year], MRN [id number]  PCP:  Nickola Major, NP  Cardiologist:  Berniece Salines, DO  Electrophysiologist:  None   Referring MD: Nickola Major*   " I am having chest pain"  History of Present Illness:    Anne [name] is a 42 y.o. female with a hx of GERD, Graves' disease, hypertension, mitral regurgitation, smoker here today today for follow-up visit.  At her last visit with me which was in February 2022 she was experiencing intermittent chest discomfort.  With his history of's concern I sent the patient for coronary CTA.  Also counseled the patient on quitting smoking and for completeness an echocardiogram to assess for LV structural abnormalities.   Past Medical History:  Diagnosis Date   Abnormal echocardiogram    a. 11/2016 - increased LVOT gradient. see 01/09/2017 cardiology OV in Epic   Asthma    rarely uses, only when sick   Chronic midline low back pain without sciatica    off and on   COVID-19 05/11/2022   given Paxlovid, pt stated she did not take due to being unable to swallow pills   GERD (gastroesophageal reflux disease)    Graves disease    Follows w/ Dr. Buddy Duty, endocronologist with [facility].   Heart murmur    12/14/16 echocardiogram in Epic   Hypertension    Follows w/ PCP, Monina Medina-Vargus, NP.   Mild mitral regurgitation    12/14/16 echo in Epic   Normocytic anemia    Obesity    Thyrotoxicosis 11/2014   Tobacco abuse    As of 2024, smoking hx of 20 years.   Tricuspid regurgitation    12/14/16 echo in [place]   Wears glasses     Past Surgical History:  Procedure Laterality Date   ECTOPIC PREGNANCY SURGERY  2012   MANDIBLE SURGERY     around 2002   TUBAL LIGATION     one tube, ruptured during ectopic pregancy    Current Medications: Current Meds  Medication Sig   amLODipine (NORVASC) 5 MG tablet Take 1 tablet (5 mg total) by mouth daily.    cyclobenzaprine (FLEXERIL) 5 MG tablet Take 1 tablet (5 mg total) by mouth at bedtime as needed for muscle spasms.   hydrochlorothiazide (HYDRODIURIL) 12.5 MG tablet TAKE 1 TABLET BY MOUTH EVERY DAY   methimazole (TAPAZOLE) 10 MG tablet TAKE 1 TABLET BY MOUTH TWICE A DAY   metoprolol succinate (TOPROL XL) 25 MG 24 hr tablet Take 1 tablet (25 mg total) by mouth in the morning and at bedtime.   metoprolol tartrate (LOPRESSOR) 25 MG tablet Take 0.5 tablets (12.5 mg total) by mouth 2 (two) times daily.   [DISCONTINUED] metoprolol tartrate (LOPRESSOR) 100 MG tablet Take 2 hours prior to CT     Allergies:   Food allergy formula and Shellfish allergy   Social History   Socioeconomic History   Marital status: Divorced    Spouse name: Not on file   Number of children: Not on file   Years of education: Not on file   Highest education level: Not on file  Occupational History   Not on file  Tobacco Use   Smoking status: Every Day    Packs/day: 1.00    Years: 20.00    Additional pack years: 0.00    Total pack years: 20.00    Types: Cigarettes   Smokeless tobacco: Never  Vaping  Use   Vaping Use: Former   Start date: 04/24/2015   Quit date: 04/23/2016  Substance and Sexual Activity   Alcohol use: Yes    Alcohol/week: 1.0 standard drink of alcohol    Types: 1 Glasses of wine per week    Comment: few beers and liquor on the weekend   Drug use: No   Sexual activity: Yes    Birth control/protection: None  Other Topics Concern   Not on file  Social History Narrative   Not on file   Social Determinants of Health   Financial Resource Strain: Not on file  Food Insecurity: Not on file  Transportation Needs: Not on file  Physical Activity: Not on file  Stress: Not on file  Social Connections: Not on file     Family History: The patient's family history includes Cervical cancer in her mother; Diabetes in her maternal aunt, maternal aunt, and maternal grandmother; Heart Problems in her  maternal aunt; Heart murmur in her paternal grandmother; Hypertension in her father; Kidney disease in her maternal aunt; Thyroid disease in her maternal grandmother and mother.  ROS:   Review of Systems  Constitution: Negative for decreased appetite, fever and weight gain.  HENT: Negative for congestion, ear discharge, hoarse voice and sore throat.   Eyes: Negative for discharge, redness, vision loss in right eye and visual halos.  Cardiovascular: Reports chest pain, dyspnea on exertion.negative for leg swelling, orthopnea and palpitations.  Respiratory: Negative for cough, hemoptysis, shortness of breath and snoring.   Endocrine: Negative for heat intolerance and polyphagia.  Hematologic/Lymphatic: Negative for bleeding problem. Does not bruise/bleed easily.  Skin: Negative for flushing, nail changes, rash and suspicious lesions.  Musculoskeletal: Negative for arthritis, joint pain, muscle cramps, myalgias, neck pain and stiffness.  Gastrointestinal: Negative for abdominal pain, bowel incontinence, diarrhea and excessive appetite.  Genitourinary: Negative for decreased libido, genital sores and incomplete emptying.  Neurological: Negative for brief paralysis, focal weakness, headaches and loss of balance.  Psychiatric/Behavioral: Negative for altered mental status, depression and suicidal ideas.  Allergic/Immunologic: Negative for HIV exposure and persistent infections.    EKGs/Labs/Other Studies Reviewed:    The following studies were reviewed today:   EKG:  The ekg ordered today demonstrates   CCTA 06/25/2022 FINDINGS: Quality: Fair, attenuation artifact, HR 68   Coronary calcium score: The patient's coronary artery calcium score is 0, which places the patient in the 0 percentile.   Coronary arteries: Normal coronary origins.  Right dominance.   Right Coronary Artery: Dominant.  No disease.   Left Main Coronary Artery: Normal. Bifurcates into the LAD and LCx arteries.    Left Anterior Descending Coronary Artery: Anterior artery that reaches the apex, no disease. High diagonal branch which bifurcates, no disease.   Left Circumflex Artery: AV groove vessel without disease. OM1 branch without disease.   Aorta: Normal size, 32 mm at the mid ascending aorta (level of the PA bifurcation) measured double oblique. No calcifications. No dissection.   Aortic Valve: Trileaflet. No calcifications.   Other findings:   Normal pulmonary vein drainage into the left atrium.   Normal left atrial appendage without a thrombus.   Normal size of the pulmonary artery.   IMPRESSION: 1. No evidence of CAD, CADRADS = 0.   2. Coronary calcium score of 0. This was 0 percentile for age and sex matched control.   3. Normal coronary origin with right dominance.   4. Consider non-coronary causes of chest pain.   Electronically Signed: By: Chrissie Noa  Wells Guiles M.D. On: 06/25/2022 13:32    TTE 06/15/2022 IMPRESSIONS     1. Very mild intracavitary gradient. Peak velocity 0.64 m/s. Peak  gradient 1.6 mmHg. Left ventricular ejection fraction, by estimation, is  65 to 70%. The left ventricle has normal function. The left ventricle has  no regional wall motion abnormalities.  There is moderate concentric left ventricular hypertrophy. Left  ventricular diastolic parameters are consistent with Grade I diastolic  dysfunction (impaired relaxation). The average left ventricular global  longitudinal strain is -17.3 %. The global  longitudinal strain is normal.   2. Right ventricular systolic function is normal. The right ventricular  size is normal. There is normal pulmonary artery systolic pressure.   3. Left atrial size was severely dilated.   4. The mitral valve is normal in structure. Trivial mitral valve  regurgitation. No evidence of mitral stenosis.   5. The gradient through the LVOT is elevated. However the aortic valve  leaflets are tricuspid and not stenotic. There  is a hyperechoic density in  the LVOT. Suspect subvalvular membrane. Cardiac CTA is pending. The aortic  valve is tricuspid. Aortic valve   regurgitation is mild to moderate. No aortic stenosis is present. Aortic  regurgitation PHT measures 452 msec. Aortic valve area, by VTI measures  1.65 cm. Aortic valve mean gradient measures 22.0 mmHg. Aortic valve Vmax  measures 3.14 m/s.   6. The inferior vena cava is normal in size with greater than 50%  respiratory variability, suggesting right atrial pressure of 3 mmHg.   Comparison(s): EF 65%, question of subaortic membrane with LVOT gradient.   FINDINGS   Left Ventricle: Very mild intracavitary gradient. Peak velocity 0.64 m/s.  Peak gradient 1.6 mmHg. Left ventricular ejection fraction, by estimation,  is 65 to 70%. The left ventricle has normal function. The left ventricle  has no regional wall motion  abnormalities. The average left ventricular global longitudinal strain is  -17.3 %. The global longitudinal strain is normal. The left ventricular  internal cavity size was normal in size. There is moderate concentric left  ventricular hypertrophy. Left  ventricular diastolic parameters are consistent with Grade I diastolic  dysfunction (impaired relaxation). Indeterminate filling pressures.   Right Ventricle: The right ventricular size is normal. No increase in  right ventricular wall thickness. Right ventricular systolic function is  normal. There is normal pulmonary artery systolic pressure. The tricuspid  regurgitant velocity is 2.72 m/s, and   with an assumed right atrial pressure of 3 mmHg, the estimated right  ventricular systolic pressure is [id number] mmHg.   Left Atrium: Left atrial size was severely dilated.   Right Atrium: Right atrial size was normal in size.   Pericardium: There is no evidence of pericardial effusion.   Mitral Valve: The mitral valve is normal in structure. Trivial mitral  valve regurgitation. No evidence of  mitral valve stenosis.   Tricuspid Valve: The tricuspid valve is normal in structure. Tricuspid  valve regurgitation is mild . No evidence of tricuspid stenosis.   Aortic Valve: The gradient through the LVOT is elevated. However the  aortic valve leaflets are tricuspid and not stenotic. There is a  hyperechoic density in the LVOT. Suspect subvalvular membrane. Cardiac CTA  is pending. The aortic valve is tricuspid.  Aortic valve regurgitation is mild to moderate. Aortic regurgitation PHT  measures 452 msec. No aortic stenosis is present. Aortic valve mean  gradient measures 22.0 mmHg. Aortic valve peak gradient measures 39.4  mmHg. Aortic valve area, by  VTI measures  1.65 cm.   Pulmonic Valve: The pulmonic valve was normal in structure. Pulmonic valve  regurgitation is trivial. No evidence of pulmonic stenosis.   Aorta: The aortic root is normal in size and structure.   Venous: The inferior vena cava is normal in size with greater than 50%  respiratory variability, suggesting right atrial pressure of 3 mmHg.   IAS/Shunts: No atrial level shunt detected by color flow Doppler    Recent Labs: 05/11/2022: ALT 39; TSH <0.010 06/11/2022: Hemoglobin 13.6; Platelets 276 06/14/2022: BUN 12; Creatinine, Ser 0.64; Magnesium 2.1; Potassium 4.2; Sodium 140  Recent Lipid Panel    Component Value Date/Time   CHOL 93 05/28/2022 1101   TRIG 90 05/28/2022 1101   HDL 36 (L) 05/28/2022 1101   CHOLHDL 2.6 05/28/2022 1101   LDLCALC 40 05/28/2022 1101    Physical Exam:    VS:  BP (!) 130/94   Pulse 82   Ht 5\' 11"  (1.803 m)   Wt 112.6 kg   LMP 07/04/2022   SpO2 100%   BMI 34.62 kg/m     Wt Readings from Last 3 Encounters:  07/09/22 112.6 kg  06/14/22 109.8 kg  06/12/22 110.2 kg     GEN: Well nourished, well developed in no acute distress HEENT: Normal NECK: No JVD; No carotid bruits LYMPHATICS: No lymphadenopathy CARDIAC: S1S2 noted,RRR, no murmurs, rubs, gallops RESPIRATORY:   Clear to auscultation without rales, wheezing or rhonchi  ABDOMEN: Soft, non-tender, non-distended, +bowel sounds, no guarding. EXTREMITIES: No edema, No cyanosis, no clubbing MUSCULOSKELETAL:  No deformity  SKIN: Warm and dry NEUROLOGIC:  Alert and oriented x 3, non-focal PSYCHIATRIC:  Normal affect, good insight  ASSESSMENT:    1. Palpitations   2. Primary hypertension   3. Morbid obesity ([place])     PLAN:    We discussed the result from coronary CT scan which does not show any evidence of coronary artery disease.  We talked about echocardiogram as well.  She tells me she is experiencing significant palpitations.  I will place a monitor the patient to make sure that atrial fibrillation is not playing a role here..  At the same time we will increase her metoprolol to 25 mg twice daily.  The patient was counseled on tobacco cessation today for 5 minutes.  Counseling included reviewing the risks of smoking tobacco products, how it impacts the patient's current medical diagnoses and different strategies for quitting.  Pharmacotherapy to aid in tobacco cessation was not prescribed today. The patient coordinate with  primary care provider.  The patient was also advised to call  1-800-QUIT-NOW [phone] [phone]) for additional help with quitting smoking.  Will get blood work today.  The patient is in agreement with the above plan. The patient left the office in stable condition.  The patient will follow up in 4 weeks.   Medication Adjustments/Labs and Tests Ordered: Current medicines are reviewed at length with the patient today.  Concerns regarding medicines are outlined above.  Orders Placed This Encounter  Procedures   LONG TERM MONITOR (3-14 DAYS)   Meds ordered this encounter  Medications   metoprolol succinate (TOPROL XL) 25 MG 24 hr tablet    Sig: Take 1 tablet (25 mg total) by mouth in the morning and at bedtime.    Dispense:  180 tablet    Refill:  3    Patient Instructions   Medication Instructions:  Your physician has recommended you make the following change in your medication:  INCREASE:  Toprol-XL 25 mg twice daily *If you need a refill on your cardiac medications before your next appointment, please call your pharmacy*   Lab Work: None If you have labs (blood work) drawn today and your tests are completely normal, you will receive your results only by: [place] (if you have MyChart) OR A paper copy in the mail If you have any lab test that is abnormal or we need to change your treatment, we will call you to review the results.   Testing/Procedures: [place] [place]- Long Term Monitor Instructions  Your physician has requested you wear a ZIO patch monitor for 14 days.  This is a single patch monitor. [employer] supplies one patch monitor per enrollment. Additional stickers are not available. Please do not apply patch if you will be having a Nuclear Stress Test,  Echocardiogram, Cardiac CT, MRI, or Chest Xray during the period you would be wearing the  monitor. The patch cannot be worn during these tests. You cannot remove and re-apply the  ZIO XT patch monitor.  Your ZIO patch monitor will be mailed 3 day USPS to your address on file. It may take 3-5 days  to receive your monitor after you have been enrolled.  Once you have received your monitor, please review the enclosed instructions. Your monitor  has already been registered assigning a specific monitor serial # to you.  Billing and Patient Assistance Program Information  We have supplied [employer] with any of your insurance information on file for billing purposes. [employer] offers a sliding scale Patient Assistance Program for patients that do not have  insurance, or whose insurance does not completely cover the cost of the ZIO monitor.  You must apply for the Patient Assistance Program to qualify for this discounted rate.  To apply, please call [employer] at [phone], select option 4, select option 2,  ask to apply for  Patient Assistance Program. Theodore Demark will ask your household income, and how many people  are in your household. They will quote your out-of-pocket cost based on that information.  [employer] will also be able to set up a 20-month, interest-free payment plan if needed.  Applying the monitor   Shave hair from upper left chest.  Hold abrader disc by orange tab. Rub abrader in 40 strokes over the upper left chest as  indicated in your monitor instructions.  Clean area with 4 enclosed alcohol pads. Let dry.  Apply patch as indicated in monitor instructions. Patch will be placed under collarbone on left  side of chest with arrow pointing upward.  Rub patch adhesive wings for 2 minutes. Remove white label marked "1". Remove the white  label marked "2". Rub patch adhesive wings for 2 additional minutes.  While looking in a mirror, press and release button in center of patch. A small green light will  flash 3-4 times. This will be your only indicator that the monitor has been turned on.  Do not shower for the first 24 hours. You may shower after the first 24 hours.  Press the button if you feel a symptom. You will hear a small click. Record Date, Time and  Symptom in the Patient Logbook.  When you are ready to remove the patch, follow instructions on the last 2 pages of Patient  Logbook. Stick patch monitor onto the last page of Patient Logbook.  Place Patient Logbook in the blue and white box. Use locking tab on box and tape box closed  securely. The blue and white box has  prepaid postage on it. Please place it in the mailbox as  soon as possible. Your physician should have your test results approximately 7 days after the  monitor has been mailed back to [facility].  Call [facility] at [phone] if you have questions regarding  your ZIO XT patch monitor. Call them immediately if you see an orange light blinking on your  monitor.  If your monitor falls off  in less than 4 days, contact our Monitor department at [phone].  If your monitor becomes loose or falls off after 4 days call [employer] at [phone] for  suggestions on securing your monitor     Follow-Up: At [facility], you and your health needs are our priority.  As part of our continuing mission to provide you with exceptional heart care, we have created designated Provider Care Teams.  These Care Teams include your primary Cardiologist (physician) and Advanced Practice Providers (APPs -  Physician Assistants and Nurse Practitioners) who all work together to provide you with the care you need, when you need it.   Your next appointment:   4 month(s)  Provider:   Berniece Salines, DO    Adopting a Healthy Lifestyle.  Know what a healthy weight is for you (roughly BMI <25) and aim to maintain this   Aim for 7+ servings of fruits and vegetables daily   65-80+ fluid ounces of water or unsweet tea for healthy kidneys   Limit to max 1 drink of alcohol per day; avoid smoking/tobacco   Limit animal fats in diet for cholesterol and heart health - choose grass fed whenever available   Avoid highly processed foods, and foods high in saturated/trans fats   Aim for low stress - take time to unwind and care for your mental health   Aim for 150 min of moderate intensity exercise weekly for heart health, and weights twice weekly for bone health   Aim for 7-9 hours of sleep daily   When it comes to diets, agreement about the perfect plan isnt easy to find, even among the experts. Experts at the [employer] developed an idea known as the Healthy Eating Plate. Just imagine a plate divided into logical, healthy portions.   The emphasis is on diet quality:   Load up on vegetables and fruits - one-half of your plate: Aim for color and variety, and remember that potatoes dont count.   Go for whole grains - one-quarter of your plate: Whole wheat, barley,  wheat berries, quinoa, oats, brown rice, and foods made with them. If you want pasta, go with whole wheat pasta.   Protein power - one-quarter of your plate: Fish, chicken, beans, and nuts are all healthy, versatile protein sources. Limit red meat.   The diet, however, does go beyond the plate, offering a few other suggestions.   Use healthy plant oils, such as olive, canola, soy, corn, sunflower and peanut. Check the labels, and avoid partially hydrogenated oil, which have unhealthy trans fats.   If youre thirsty, drink water. Coffee and tea are good in moderation, but skip sugary drinks and limit milk and dairy products to one or two daily servings.   The type of carbohydrate in the diet is more important than the amount. Some sources of carbohydrates, such as vegetables, fruits, whole grains, and beans-are healthier than others.   Finally, stay active  Signed, Berniece Salines, DO  07/11/2022 8:16 AM    [place]

## 2022-07-09 NOTE — Progress Notes (Unsigned)
z

## 2022-07-15 DIAGNOSIS — R002 Palpitations: Secondary | ICD-10-CM | POA: Diagnosis not present

## 2022-08-08 ENCOUNTER — Other Ambulatory Visit: Payer: Self-pay | Admitting: Adult Health

## 2022-08-08 DIAGNOSIS — G8929 Other chronic pain: Secondary | ICD-10-CM

## 2022-08-09 ENCOUNTER — Ambulatory Visit (INDEPENDENT_AMBULATORY_CARE_PROVIDER_SITE_OTHER): Payer: BC Managed Care – PPO | Admitting: Adult Health

## 2022-08-09 ENCOUNTER — Encounter: Payer: Self-pay | Admitting: Adult Health

## 2022-08-09 VITALS — BP 139/88 | HR 65 | Temp 97.7°F | Resp 18 | Ht 71.0 in | Wt 254.2 lb

## 2022-08-09 DIAGNOSIS — E669 Obesity, unspecified: Secondary | ICD-10-CM

## 2022-08-09 DIAGNOSIS — E0500 Thyrotoxicosis with diffuse goiter without thyrotoxic crisis or storm: Secondary | ICD-10-CM

## 2022-08-09 DIAGNOSIS — E0590 Thyrotoxicosis, unspecified without thyrotoxic crisis or storm: Secondary | ICD-10-CM

## 2022-08-09 DIAGNOSIS — R252 Cramp and spasm: Secondary | ICD-10-CM

## 2022-08-09 DIAGNOSIS — I10 Essential (primary) hypertension: Secondary | ICD-10-CM | POA: Diagnosis not present

## 2022-08-09 DIAGNOSIS — Z720 Tobacco use: Secondary | ICD-10-CM

## 2022-08-09 DIAGNOSIS — Z6835 Body mass index (BMI) 35.0-35.9, adult: Secondary | ICD-10-CM

## 2022-08-09 DIAGNOSIS — F5101 Primary insomnia: Secondary | ICD-10-CM

## 2022-08-09 MED ORDER — NICOTINE 21 MG/24HR TD PT24: 21.0000 mg | MEDICATED_PATCH | Freq: Every day | TRANSDERMAL | 0 refills | Status: DC

## 2022-08-09 MED ORDER — MELATONIN 5 MG PO TABS: 5.0000 mg | ORAL_TABLET | Freq: Every day | ORAL | 9 refills | Status: DC

## 2022-08-09 NOTE — Telephone Encounter (Signed)
Patient request refill on medication Cyclobenzaprine . Patient last refill dated 05/28/2022. Patient was only given 30 pills. Medication pend and sent to PCP [name], Margit Banda, NP for approval.

## 2022-08-09 NOTE — Progress Notes (Signed)
[facility] clinic  Provider:  Kenard Gower DNP  Code Status:  Full Code  Goals of Care:     06/11/2022   11:21 AM  Advanced Directives  Does Patient Have a Medical Advance Directive? No  Would patient like information on creating a medical advance directive? Yes (MAU/Ambulatory/Procedural Areas - Information given)     Chief Complaint  Patient presents with   Acute Visit    Numb in hand    HPI: Patient is a 42 y.o. female seen today for an acute visit for numbness on her fingertips for 4-5 months now. She has a PMH of Grave's disease, GERD and hypertension. She takes Tapazole 10 mg BID for Grave's disease. She was referred to endocrinology but missed her appointment. She currently smokes a pack of cigarettes/day and take 4 "shots" of liquor every 2 weeks. Today BP 139/88. She takes Metoprolol tartrate, HCTZ and Amlodipine. She complains of having occasional bilateral lower leg cramps at night. She takes Flexeril 5 mg at bedtime PRN.    Wt Readings from Last 3 Encounters:  08/09/22 254 lb 4 oz (115.3 kg)  07/09/22 248 lb 3.2 oz (112.6 kg)  06/14/22 242 lb (109.8 kg)     Past Medical History:  Diagnosis Date   Abnormal echocardiogram    a. 11/2016 - increased LVOT gradient. see 01/09/2017 cardiology OV in Epic   Asthma    rarely uses, only when sick   Chronic midline low back pain without sciatica    off and on   COVID-19 05/11/2022   given Paxlovid, pt stated she did not take due to being unable to swallow pills   GERD (gastroesophageal reflux disease)    Graves disease    Follows w/ Dr. Sharl Ma, endocronologist with [facility].   Heart murmur    12/14/16 echocardiogram in Epic   Hypertension    Follows w/ PCP, Syenna Nazir Medina-Vargus, NP.   Mild mitral regurgitation    12/14/16 echo in Epic   Normocytic anemia    Obesity    Thyrotoxicosis 11/2014   Tobacco abuse    As of 2024, smoking hx of 20 years.   Tricuspid regurgitation    12/14/16 echo in Epic   Wears  glasses     Past Surgical History:  Procedure Laterality Date   ECTOPIC PREGNANCY SURGERY  2012   MANDIBLE SURGERY     around 2002   TUBAL LIGATION     one tube, ruptured during ectopic pregancy    Allergies  Allergen Reactions   Food Allergy Formula Anaphylaxis and Swelling    coconut   Shellfish Allergy Swelling    Outpatient Encounter Medications as of 08/09/2022  Medication Sig   cyclobenzaprine (FLEXERIL) 5 MG tablet Take 1 tablet (5 mg total) by mouth at bedtime as needed for muscle spasms.   hydrochlorothiazide (HYDRODIURIL) 12.5 MG tablet TAKE 1 TABLET BY MOUTH EVERY DAY   methimazole (TAPAZOLE) 10 MG tablet TAKE 1 TABLET BY MOUTH TWICE A DAY   metoprolol tartrate (LOPRESSOR) 25 MG tablet Take 12.5 mg by mouth 2 (two) times daily.   [DISCONTINUED] metoprolol succinate (TOPROL XL) 25 MG 24 hr tablet Take 1 tablet (25 mg total) by mouth in the morning and at bedtime.   [DISCONTINUED] metoprolol tartrate (LOPRESSOR) 25 MG tablet Take 0.5 tablets (12.5 mg total) by mouth 2 (two) times daily.   amLODipine (NORVASC) 5 MG tablet Take 1 tablet (5 mg total) by mouth daily.   No facility-administered encounter medications on file  as of 08/09/2022.    Review of Systems:  Review of Systems  Constitutional:  Negative for appetite change, chills, fatigue and fever.  HENT:  Negative for congestion, hearing loss, rhinorrhea and sore throat.   Eyes: Negative.   Respiratory:  Negative for cough, shortness of breath and wheezing.   Cardiovascular:  Positive for palpitations. Negative for chest pain and leg swelling.  Gastrointestinal:  Negative for abdominal pain, constipation, diarrhea, nausea and vomiting.  Genitourinary:  Negative for dysuria.  Musculoskeletal:  Positive for arthralgias. Negative for back pain and myalgias.  Skin:  Negative for color change, rash and wound.  Neurological:  Positive for tremors and numbness. Negative for dizziness, weakness and headaches.   Psychiatric/Behavioral:  Negative for behavioral problems. The patient is not nervous/anxious.     Health Maintenance  Topic Date Due   COVID-19 Vaccine (1) Never done   DTaP/Tdap/Td (1 - Tdap) Never done   PAP SMEAR-Modifier  Never done   INFLUENZA VACCINE  11/22/2022   Hepatitis C Screening  Completed   HIV Screening  Completed   HPV VACCINES  Aged Out    Physical Exam: Vitals:   08/09/22 0837  BP: 139/88  Pulse: 65  Resp: 18  Temp: 97.7 F (36.5 C)  SpO2: 99%  Weight: 254 lb 4 oz (115.3 kg)  Height:  (1.803 m)   Body mass index is 35.46 kg/m. Physical Exam Constitutional:      Appearance: She is obese.  HENT:     Head: Normocephalic and atraumatic.     Nose: Nose normal.     Mouth/Throat:     Mouth: Mucous membranes are moist.  Eyes:     Conjunctiva/sclera: Conjunctivae normal.  Cardiovascular:     Rate and Rhythm: Normal rate and regular rhythm.     Heart sounds: Murmur heard.  Pulmonary:     Effort: Pulmonary effort is normal.     Breath sounds: Normal breath sounds.  Abdominal:     General: Bowel sounds are normal.     Palpations: Abdomen is soft.  Musculoskeletal:        General: Normal range of motion.     Cervical back: Normal range of motion.  Skin:    General: Skin is warm and dry.  Neurological:     General: No focal deficit present.     Mental Status: She is alert and oriented to person, place, and time.  Psychiatric:        Mood and Affect: Mood normal.        Behavior: Behavior normal.        Thought Content: Thought content normal.        Judgment: Judgment normal.     Labs reviewed: Basic Metabolic Panel: Recent Labs    03/30/22 1137 05/11/22 0843 06/11/22 1148 06/14/22 1613  NA 137 137 139 140  K 3.9 3.9 5.8* 4.2  CL 112* 107 106 103  CO2 20* 22  --  23  GLUCOSE 91 92 93 79  BUN CREATININE 0.55 0.55 0.50 0.64  CALCIUM 8.5* 8.7*  --  9.4  MG  --   --   --  2.1  TSH  --  <0.010*  --   --    Liver  Function Tests: Recent Labs    03/30/22 1137 05/11/22 0843  AST 16 50*  ALT 10 39  ALKPHOS 54 62  BILITOT 1.1 0.4  PROT 6.6 7.0  ALBUMIN 3.3* 3.5  Recent Labs    05/11/22 0843  LIPASE 31   No results for input(s): "AMMONIA" in the last 8760 hours. CBC: Recent Labs    03/30/22 0828 05/11/22 0843 06/11/22 1138 06/11/22 1148  WBC 8.2 3.8* 6.7  --   NEUTROABS  --  1.9  --   --   HGB 12.4 11.9* 13.0 13.6  HCT 37.2 36.2 39.6 40.0  MCV 92.1 90.0 88.0  --   PLT 191 162 276  --    Lipid Panel: Recent Labs    05/28/22 1101  CHOL 93  HDL 36*  LDLCALC 40  TRIG 90  CHOLHDL 2.6   Lab Results  Component Value Date   HGBA1C 5.4 05/28/2022    Procedures since last visit: No results found.  Assessment/Plan  1. Graves disease -  continue Tapazole - TSH - Ambulatory referral to Endocrinology Addendum 08/10/22:  TSH <0.010, will increase Tapazole 10 mg BID to TID and repeat tsh, T4 free and total T3 in 6 weeks  2. Leg cramps -  continue Flexeril PRN - Magnesium - Basic Metabolic Panel with eGFR  3. Primary hypertension -  BP 139/88 -  followed up with cardiology on 07/11/22 and Metoprolol was increased to 25 mg BID due to palpitations -  continue Amlodipine and HCTZ  4. Class 2 obesity with body mass index (BMI) of 35.0 to 35.9 in adult, unspecified obesity type, unspecified whether serious comorbidity present Body mass index is 35.46 kg/m.  -  counselled -  plans to walk in the park 150 mins/week  5. Primary insomnia - melatonin 5 MG TABS; Take 1 tablet (5 mg total) by mouth at bedtime.  Dispense: 30 tablet; Refill: 9  6. Tobacco abuse - counseled -  plans to stop smoking - nicotine (NICODERM CQ - DOSED IN MG/24 HOURS) 21 mg/24hr patch; Place 1 patch (21 mg total) onto the skin daily.  Dispense: 28 patch; Refill: 0    Labs/tests ordered:  BMP, Mg and tsh  Next appt:  3 months

## 2022-08-10 MED ORDER — METHIMAZOLE 10 MG PO TABS: 10.0000 mg | ORAL_TABLET | Freq: Three times a day (TID) | ORAL | 3 refills | Status: DC

## 2022-08-10 NOTE — Progress Notes (Signed)
Tsh low at <0.01, increase Tapazole from 10 mg twice a day to 3X/day. Will need to have repeat TSH, free T4 and total t3 in 6 weeks.

## 2022-08-10 NOTE — Progress Notes (Signed)
-    electrolytes, kidney function and magnesium are all within normal level

## 2022-08-10 NOTE — Progress Notes (Signed)
Detailed message left on identifiable machine belonging to the patient. Advised to call with any questions or concerns.   

## 2022-08-13 LAB — BASIC METABOLIC PANEL WITH GFR
BUN: 10 mg/dL (ref 7–25)
CO2: 26 mmol/L (ref 20–32)
Calcium: 9.2 mg/dL (ref 8.6–10.2)
Chloride: 110 mmol/L (ref 98–110)
Creat: 0.66 mg/dL (ref 0.50–0.99)
Glucose, Bld: 68 mg/dL (ref 65–99)
Potassium: 4 mmol/L (ref 3.5–5.3)
Sodium: 142 mmol/L (ref 135–146)
eGFR: 112 mL/min/{1.73_m2} (ref >=60)

## 2022-08-13 LAB — MAGNESIUM: Magnesium: 2.1 mg/dL (ref 1.5–2.5)

## 2022-08-13 LAB — T3: T3, Total: 197 ng/dL — ABNORMAL HIGH (ref 76–181)

## 2022-08-13 LAB — T4, FREE: Free T4: 2 ng/dL — ABNORMAL HIGH (ref 0.8–1.8)

## 2022-08-13 LAB — TSH: TSH: <0.01 mIU/L — ABNORMAL LOW

## 2022-09-06 ENCOUNTER — Ambulatory Visit: Payer: BC Managed Care – PPO | Admitting: Cardiology

## 2022-09-07 NOTE — Progress Notes (Deleted)
Cardiology Office Note:    Date:  09/07/2022   ID:  Anne [name], DOB 1981/01/24, MRN [id number]  PCP:  Gillis Santa, NP  Cardiologist:  Thomasene Ripple, DO  Electrophysiologist:  None   Referring MD: Gillis Santa*   Chief Complaint: ***  History of Present Illness:    Anne [name] is a 42 y.o. female with a history of normal coronaries on coronary CTA in 06/2022, mild to moderate aortic insufficiency, palpitations with only rare PACs/ PVCs on monitor in 06/2022, hypertension, Graves disease, GERD, obesity, and tobacco use who is followed by Dr. Servando Salina and presents today for ***.   Patient was initially referred to Cardiology in 12/2016 for further evaluation of an abnormal Echo in 11/2016 which showed LVEF of 65-70% with a significant gradient across LVOT and possible subvalvular membrane. This Echo was done during an admission for severe thyrotoxicosis. The increased LVOT gradient was felt to likely be due to increased metabolic demand from thyrotoxicosis and plan was for repeat Echo once euythroid. She was not seen by Cardiology again until 05/2022 when she was referred to Dr. Servando Salina for further evaluation of intermittent chest pain with associated shortness of breath. Coronary CTA and Echo were ordered for further evaluation. Echo showed LVEF of 65-70% with normal wall motion and grade 1 diastolic dysfunction, mild to moderate AI, and a very mild intracavitary gradient with peak gradient of 1.6 mmHg. However, the aortic valve was not stenotic and there was a hyperechoic density in the LVOT and it was suspected that patient may have a subvalvular membrane. Coronary CTA showed a coronary calcium score of 0 with no evidence of CAD. There was no mention of a subaortic membrane. She was last seen by Dr. Servando Salina in 06/2022 at which time she reported significant palpitations. Therefore, Lopressor was increased and a Zio monitor was ordered for further evaluation and showed rare  PACs/PVCs but no concerning arrhythmias.   Patient presents today for follow-up. ***  Palpitations Patient has a history of palpitations. Monitor in 06/2022 showed underlying sinus rhythm with rare PACs/PVCs but no significant arrhythmias. Symptoms were associated with sinus rhythm.  - *** - Continue Lopressor 25mg  twice daily.  Hypertension BP well controlled. *** - Continue current medications: Amlodipine 5mg  daily, HCTZ 12.5mg  daily, and Lopressor 25mg  twice daily.   Aortic Insufficiency Questionable Subaortic Membrane Last Echo in 05/2022 showed normal function with mild to moderate AI and a very mild intracavitary gradient across the LVOT with peak gradient of 1.6 mmHg. The aortic valve was not stenotic and there was a hyperechoic density in the LVOT and it was suspected that patient may have a subvalvular membrane. There was no mention a subvalvular aortic membrane on coronary CTA in 06/2022.  - Can continue routine surveillance Echos for monitoring of AI. Consider repeat Echo in 2 years.   Obesity ***  Tobacco Abuse ***  Past Medical History:  Diagnosis Date   Abnormal echocardiogram    a. 11/2016 - increased LVOT gradient. see 01/09/2017 cardiology OV in Epic   Asthma    rarely uses, only when sick   Chronic midline low back pain without sciatica    off and on   COVID-19 05/11/2022   given Paxlovid, pt stated she did not take due to being unable to swallow pills   GERD (gastroesophageal reflux disease)    Graves disease    Follows w/ Dr. Sharl Ma, endocronologist with [facility].   Heart murmur    12/14/16  echocardiogram in Epic   Hypertension    Follows w/ PCP, Monina Medina-Vargus, NP.   Mild mitral regurgitation    12/14/16 echo in Epic   Normocytic anemia    Obesity    Thyrotoxicosis 11/2014   Tobacco abuse    As of 2024, smoking hx of 20 years.   Tricuspid regurgitation    12/14/16 echo in Epic   Wears glasses     Past Surgical History:  Procedure  Laterality Date   ECTOPIC PREGNANCY SURGERY  2012   MANDIBLE SURGERY     around 2002   TUBAL LIGATION     one tube, ruptured during ectopic pregancy    Current Medications: No outpatient medications have been marked as taking for the 09/11/22 encounter (Appointment) with Corrin Parker, PA-C.     Allergies:   Food allergy formula and Shellfish allergy   Social History   Socioeconomic History   Marital status: Divorced    Spouse name: Not on file   Number of children: Not on file   Years of education: Not on file   Highest education level: GED or equivalent  Occupational History   Not on file  Tobacco Use   Smoking status: Every Day    Packs/day: 1.00    Years: 20.00    Additional pack years: 0.00    Total pack years: 20.00    Types: Cigarettes   Smokeless tobacco: Never  Vaping Use   Vaping Use: Former   Start date: 04/24/2015   Quit date: 04/23/2016  Substance and Sexual Activity   Alcohol use: Yes    Alcohol/week: 1.0 standard drink of alcohol    Types: 1 Glasses of wine per week    Comment: few beers and liquor on the weekend   Drug use: No   Sexual activity: Yes    Birth control/protection: None  Other Topics Concern   Not on file  Social History Narrative   Not on file   Social Determinants of Health   Financial Resource Strain: Low Risk  (08/08/2022)   Overall Financial Resource Strain (CARDIA)    Difficulty of Paying Living Expenses: Not very hard  Recent Concern: Financial Resource Strain - Medium Risk (08/05/2022)   Overall Financial Resource Strain (CARDIA)    Difficulty of Paying Living Expenses: Somewhat hard  Food Insecurity: Food Insecurity Present (08/08/2022)   Hunger Vital Sign    Worried About Running Out of Food in the Last Year: Sometimes true    Ran Out of Food in the Last Year: Sometimes true  Transportation Needs: No Transportation Needs (08/08/2022)   PRAPARE - Administrator, Civil Service (Medical): No    Lack of  Transportation (Non-Medical): No  Physical Activity: Insufficiently Active (08/08/2022)   Exercise Vital Sign    Days of Exercise per Week: 1 day    Minutes of Exercise per Session: 70 min  Stress: No Stress Concern Present (08/08/2022)   [employer] of Occupational Health - Occupational Stress Questionnaire    Feeling of Stress : Not at all  Social Connections: Moderately Isolated (08/08/2022)   Social Connection and Isolation Panel [NHANES]    Frequency of Communication with Friends and Family: More than three times a week    Frequency of Social Gatherings with Friends and Family: Once a week    Attends Religious Services: 1 to 4 times per year    Active Member of [employer] or Organizations: No    Attends Banker Meetings: Not  on file    Marital Status: Divorced     Family History: The patient's family history includes Cervical cancer in her mother; Diabetes in her maternal aunt, maternal aunt, and maternal grandmother; Heart Problems in her maternal aunt; Heart murmur in her paternal grandmother; Hypertension in her father; Kidney disease in her maternal aunt; Thyroid disease in her maternal grandmother and mother.  ROS:   Please see the history of present illness.     EKGs/Labs/Other Studies Reviewed:    The following studies were reviewed:  Echocardiogram 06/15/2022: Impressions:  1. Very mild intracavitary gradient. Peak velocity 0.64 m/s. Peak  gradient 1.6 mmHg. Left ventricular ejection fraction, by estimation, is  65 to 70%. The left ventricle has normal function. The left ventricle has  no regional wall motion abnormalities.  There is moderate concentric left ventricular hypertrophy. Left  ventricular diastolic parameters are consistent with Grade I diastolic  dysfunction (impaired relaxation). The average left ventricular global  longitudinal strain is -17.3 %. The global  longitudinal strain is normal.   2. Right ventricular systolic function is normal.  The right ventricular  size is normal. There is normal pulmonary artery systolic pressure.   3. Left atrial size was severely dilated.   4. The mitral valve is normal in structure. Trivial mitral valve  regurgitation. No evidence of mitral stenosis.   5. The gradient through the LVOT is elevated. However the aortic valve  leaflets are tricuspid and not stenotic. There is a hyperechoic density in  the LVOT. Suspect subvalvular membrane. Cardiac CTA is pending. The aortic  valve is tricuspid. Aortic valve   regurgitation is mild to moderate. No aortic stenosis is present. Aortic  regurgitation PHT measures 452 msec. Aortic valve area, by VTI measures  1.65 cm. Aortic valve mean gradient measures 22.0 mmHg. Aortic valve Vmax  measures 3.14 m/s.   6. The inferior vena cava is normal in size with greater than 50%  respiratory variability, suggesting right atrial pressure of 3 mmHg.   Comparison(s): EF 65%, question of subaortic membrane with LVOT gradient.  _______________  Coronary CTA 06/25/2022: Impression: 1. No evidence of CAD, CADRADS = 0. 2. Coronary calcium score of 0. This was 0 percentile for age and sex matched control. 3. Normal coronary origin with right dominance. 4. Consider non-coronary causes of chest pain. _______________  [employer] Monitor 07/15/2022 to 07/29/2022: Patient had a min HR of 59 bpm, max HR of 163 bpm, and avg HR of 99 bpm. Predominant underlying rhythm was Sinus Rhythm. Isolated SVEs were rare (<1.0%), and no SVE Couplets or SVE Triplets were present. Isolated VEs were rare (<1.0%), and no VE Couplets  or VE Triplets were present.    Symptoms associated with sinus rhythm.   Conclusion: Normal/unremarkable study.   EKG:  EKG not ordered today. EKG personally reviewed and demonstrates ***.  Recent Labs: 05/11/2022: ALT 39 06/11/2022: Hemoglobin 13.6; Platelets 276 08/09/2022: BUN 10; Creat 0.66; Magnesium 2.1; Potassium 4.0; Sodium 142; TSH <0.01  Recent Lipid  Panel    Component Value Date/Time   CHOL 93 05/28/2022 1101   TRIG 90 05/28/2022 1101   HDL 36 (L) 05/28/2022 1101   CHOLHDL 2.6 05/28/2022 1101   LDLCALC 40 05/28/2022 1101    Physical Exam:    Vital Signs: There were no vitals taken for this visit.    Wt Readings from Last 3 Encounters:  08/09/22 254 lb 4 oz (115.3 kg)  07/09/22 248 lb 3.2 oz (112.6 kg)  06/14/22 242 lb (  109.8 kg)     General: 42 y.o. female in no acute distress. HEENT: Normocephalic and atraumatic. Sclera clear. EOMs intact. Neck: Supple. No carotid bruits. No JVD. Heart: *** RRR. Distinct S1 and S2. No murmurs, gallops, or rubs. Radial and distal pedal pulses 2+ and equal bilaterally. Lungs: No increased work of breathing. Clear to ausculation bilaterally. No wheezes, rhonchi, or rales.  Abdomen: Soft, non-distended, and non-tender to palpation. Bowel sounds present in all 4 quadrants.  MSK: Normal strength and tone for age. *** Extremities: No lower extremity edema.    Skin: Warm and dry. Neuro: Alert and oriented x3. No focal deficits. Psych: Normal affect. Responds appropriately.   Assessment:    No diagnosis found.  Plan:     Disposition: Follow up in ***   Medication Adjustments/Labs and Tests Ordered: Current medicines are reviewed at length with the patient today.  Concerns regarding medicines are outlined above.  No orders of the defined types were placed in this encounter.  No orders of the defined types were placed in this encounter.   There are no Patient Instructions on file for this visit.   Signed, Corrin Parker, PA-C  09/07/2022 9:15 AM    [facility]

## 2022-09-11 ENCOUNTER — Ambulatory Visit: Payer: BC Managed Care – PPO | Attending: Cardiology | Admitting: Student

## 2022-10-17 ENCOUNTER — Other Ambulatory Visit (INDEPENDENT_AMBULATORY_CARE_PROVIDER_SITE_OTHER): Payer: BC Managed Care – PPO

## 2022-10-17 ENCOUNTER — Other Ambulatory Visit: Payer: Self-pay

## 2022-10-17 DIAGNOSIS — E0500 Thyrotoxicosis with diffuse goiter without thyrotoxic crisis or storm: Secondary | ICD-10-CM | POA: Diagnosis not present

## 2022-10-18 LAB — T4, FREE: Free T4: 1.95 ng/dL — ABNORMAL HIGH (ref 0.60–1.60)

## 2022-10-18 LAB — TSH: TSH: 0 u[IU]/mL — ABNORMAL LOW (ref 0.35–5.50)

## 2022-10-18 LAB — T3, FREE: T3, Free: 6.8 pg/mL — ABNORMAL HIGH (ref 2.3–4.2)

## 2022-10-21 LAB — THYROID STIMULATING IMMUNOGLOBULIN: TSI: 307 % baseline — ABNORMAL HIGH (ref <140)

## 2022-10-21 LAB — TRAB (TSH RECEPTOR BINDING ANTIBODY): TRAB: 12.91 IU/L — ABNORMAL HIGH (ref <=2.00)

## 2022-10-23 ENCOUNTER — Ambulatory Visit: Payer: BC Managed Care – PPO | Admitting: "Endocrinology

## 2022-11-05 ENCOUNTER — Ambulatory Visit (INDEPENDENT_AMBULATORY_CARE_PROVIDER_SITE_OTHER): Payer: BC Managed Care – PPO | Admitting: "Endocrinology

## 2022-11-05 ENCOUNTER — Encounter: Payer: Self-pay | Admitting: "Endocrinology

## 2022-11-05 VITALS — BP 150/100 | HR 99 | Ht 71.0 in | Wt 260.4 lb

## 2022-11-05 DIAGNOSIS — E0500 Thyrotoxicosis with diffuse goiter without thyrotoxic crisis or storm: Secondary | ICD-10-CM | POA: Diagnosis not present

## 2022-11-05 DIAGNOSIS — E010 Iodine-deficiency related diffuse (endemic) goiter: Secondary | ICD-10-CM

## 2022-11-05 DIAGNOSIS — F17200 Nicotine dependence, unspecified, uncomplicated: Secondary | ICD-10-CM

## 2022-11-05 MED ORDER — METHIMAZOLE 10 MG PO TABS: 30.0000 mg | ORAL_TABLET | Freq: Every day | ORAL | 1 refills | Status: DC

## 2022-11-05 NOTE — Patient Instructions (Signed)
If you notice any symptoms of worsening fatigue, fever with sore throat, loss of appetite, yellowing of eyes, dark urine, joint pains, sores in the mouth, itchy rash, light colored stools or abdominal pain, please stop the medication and call us immediately as this can be a serious side effect of the medication.

## 2022-11-05 NOTE — Progress Notes (Signed)
Outpatient Endocrinology Note [name] [place], MD  11/05/22   Anne [name] 1980-06-15 [id number]  Referring Provider: Gillis Santa* Primary Care Provider: Gillis Santa, NP Subjective  Chief Complaint  Patient presents with   Luiz Blare' Disease    Assessment & Plan  Evalynn was seen today for graves' disease.  Diagnoses and all orders for this visit:  Graves disease -     methimazole (TAPAZOLE) 10 MG tablet; Take 3 tablets (30 mg total) by mouth daily. -     T4, free; Future -     T3, free; Future -     TSH; Future -     US THYROID; Future -     US THYROID  Thyromegaly  Smoking    Anne [name] is currently not taking any thyroid medication. Stopped methimazole after she was told to be in remission.  Patient currently clinically and biochemically hyperthyroid.  Educated on thyroid axis.  Discussed the etiology for hyperthyroidism. Educated on thyroid axis.  Recommend the following: Take methimazole 10 mg thrice a day. Repeat labs in 3 months or sooner if symptoms of hyper or hypothyroidism develop.  Educated on definitive options of treatment including RAI therapy and surgery. Patient does not want RAI at this time.  Counseled on: -complications of untreated hyperthyroidism including atrial fibrillation, heart failure and osteoporosis -side effects of Methimazole including but not limited to allergic reaction, rash, bone marrow suppression, liver dysfunction and teratogenic potential -implications in pregnancy and breastfeeding -compliance and follow up needs    Thyromegaly noted in 2018 thyroid ultrasound Patient c/o obstructive symptoms Ordered f/u thyroid ultrasound   The substances in cigarettes and possibly in vaping products can affect the autoimmune processes underlying Graves' disease The patient was counseled on the dangers of tobacco use, and was advised to quit.  Reviewed strategies to maximize success, including  removing cigarettes and smoking materials from environment, stress management, support of family/friends, written materials and local smoking cessation programs. Encouraged to contact PCP for any help needed such as patches or pills to curb nicotine dependence . Patient understands and agreed   I have reviewed current medications, nurse's notes, allergies, vital signs, past medical and surgical history, family medical history, and social history for this encounter. Counseled patient on symptoms, examination findings, lab findings, imaging results, treatment decisions and monitoring and prognosis. The patient understood the recommendations and agrees with the treatment plan. All questions regarding treatment plan were fully answered.   Return in about 5 weeks (around 12/11/2022) for visit + labs before next visit.   [name] , MD  11/05/22   I have reviewed current medications, nurse's notes, allergies, vital signs, past medical and surgical history, family medical history, and social history for this encounter. Counseled patient on symptoms, examination findings, lab findings, imaging results, treatment decisions and monitoring and prognosis. The patient understood the recommendations and agrees with the treatment plan. All questions regarding treatment plan were fully answered.   History of Present Illness Anne [name] is a 42 y.o. year old female who presents to our clinic with graves disease.   TSH was undetectable in 2019 with elevated FT4 and FT3.    Symptoms suggestive of HYPERTHYROIDISM:  weight loss  No heat intolerance Yes hyperdefecation  No palpitations  Yes  Compressive symptoms:  dysphagia  Yes dysphonia  Yes, little deeper may be  positional dyspnea (especially with simultaneous arms elevation)  Yes  Smokes  Yes On biotin  No Personal history of  head/neck surgery/irradiation  No  Adverse Drug Effects from Methimazole (MMI): none  Grave's Ophthalmopathy  Clinical Activity Score: 0/9, reports eye itching   12/15/2016  THYROID ULTRASOUND   TECHNIQUE: Ultrasound examination of the thyroid gland and adjacent soft tissues was performed.   COMPARISON:  None.   FINDINGS: Parenchymal Echotexture: Mildly heterogenous   Isthmus: 0.7 cm thickness   Right lobe: 6.6 x 3.4 x 3.1 cm   Left lobe: 6.6 x 3.5 x 3 cm   _________________________________________________________   Estimated total number of nodules >/= 1 cm: 0   Number of spongiform nodules >/=  2 cm not described below (TR1): 0   Number of mixed cystic and solid nodules >/= 1.5 cm not described below (TR2): 0   _________________________________________________________   No discrete nodules identified.   IMPRESSION: Thyromegaly with mild heterogeneity, no nodule.  Physical Exam  BP (!) 150/100   Pulse 99   Ht 5\' 11"  (1.803 m)   Wt 260 lb 6.4 oz (118.1 kg)   SpO2 99%   BMI 36.32 kg/m  Constitutional: well developed, well nourished Head: normocephalic, atraumatic, no exophthalmos Eyes: sclera anicteric, no redness Neck: no thyromegaly, no thyroid tenderness; no nodules palpated Lungs: normal respiratory effort Neurology: alert and oriented, no fine hand tremor Skin: dry, no appreciable rashes Musculoskeletal: no appreciable defects Psychiatric: normal mood and affect  Allergies Allergies  Allergen Reactions   Food Allergy Formula Anaphylaxis and Swelling    coconut   Shellfish Allergy Swelling    Current Medications Patient's Medications  New Prescriptions   No medications on file  Previous Medications   AMLODIPINE (NORVASC) 5 MG TABLET    Take 1 tablet (5 mg total) by mouth daily.   CYCLOBENZAPRINE (FLEXERIL) 5 MG TABLET    TAKE 1 TABLET BY MOUTH AT BEDTIME AS NEEDED FOR MUSCLE SPASMS.   HYDROCHLOROTHIAZIDE (HYDRODIURIL) 12.5 MG TABLET    TAKE 1 TABLET BY MOUTH EVERY DAY   MELATONIN 5 MG TABS    Take 1 tablet (5 mg total) by mouth at bedtime.    METOPROLOL TARTRATE (LOPRESSOR) 25 MG TABLET    Take 25 mg by mouth 2 (two) times daily.   NICOTINE (NICODERM CQ - DOSED IN MG/24 HOURS) 21 MG/24HR PATCH    Place 1 patch (21 mg total) onto the skin daily.  Modified Medications   Modified Medication Previous Medication   METHIMAZOLE (TAPAZOLE) 10 MG TABLET methimazole (TAPAZOLE) 10 MG tablet      Take 3 tablets (30 mg total) by mouth daily.    Take 1 tablet (10 mg total) by mouth 3 (three) times daily.  Discontinued Medications   No medications on file    Past Medical History Past Medical History:  Diagnosis Date   Abnormal echocardiogram    a. 11/2016 - increased LVOT gradient. see 01/09/2017 cardiology OV in Epic   Asthma    rarely uses, only when sick   Chronic midline low back pain without sciatica    off and on   COVID-19 05/11/2022   given Paxlovid, pt stated she did not take due to being unable to swallow pills   GERD (gastroesophageal reflux disease)    Graves disease    Follows w/ Dr. Sharl Ma, endocronologist with [facility].   Heart murmur    12/14/16 echocardiogram in Epic   Hypertension    Follows w/ PCP, Monina Medina-Vargus, NP.   Mild mitral regurgitation    12/14/16 echo in Epic   Normocytic anemia  Obesity    Thyrotoxicosis 11/2014   Tobacco abuse    As of 2024, smoking hx of 20 years.   Tricuspid regurgitation    12/14/16 echo in Epic   Wears glasses     Past Surgical History Past Surgical History:  Procedure Laterality Date   ECTOPIC PREGNANCY SURGERY  2012   MANDIBLE SURGERY     around 2002   TUBAL LIGATION     one tube, ruptured during ectopic pregancy    Family History family history includes Cervical cancer in her mother; Diabetes in her maternal aunt, maternal aunt, and maternal grandmother; Heart Problems in her maternal aunt; Heart murmur in her paternal grandmother; Hypertension in her father; Kidney disease in her maternal aunt; Thyroid disease in her maternal grandmother and  mother.  Social History Social History   Socioeconomic History   Marital status: Divorced    Spouse name: Not on file   Number of children: Not on file   Years of education: Not on file   Highest education level: GED or equivalent  Occupational History   Not on file  Tobacco Use   Smoking status: Every Day    Current packs/day: 1.00    Average packs/day: 1 pack/day for 20.0 years (20.0 ttl pk-yrs)    Types: Cigarettes   Smokeless tobacco: Never  Vaping Use   Vaping status: Former   Start date: 04/24/2015   Quit date: 04/23/2016  Substance and Sexual Activity   Alcohol use: Yes    Alcohol/week: 1.0 standard drink of alcohol    Types: 1 Glasses of wine per week    Comment: few beers and liquor on the weekend   Drug use: No   Sexual activity: Yes    Birth control/protection: None  Other Topics Concern   Not on file  Social History Narrative   Not on file   Social Determinants of Health   Financial Resource Strain: Low Risk  (08/08/2022)   Overall Financial Resource Strain (CARDIA)    Difficulty of Paying Living Expenses: Not very hard  Recent Concern: Financial Resource Strain - Medium Risk (08/05/2022)   Overall Financial Resource Strain (CARDIA)    Difficulty of Paying Living Expenses: Somewhat hard  Food Insecurity: Food Insecurity Present (08/08/2022)   Hunger Vital Sign    Worried About Running Out of Food in the Last Year: Sometimes true    Ran Out of Food in the Last Year: Sometimes true  Transportation Needs: No Transportation Needs (08/08/2022)   PRAPARE - Administrator, Civil Service (Medical): No    Lack of Transportation (Non-Medical): No  Physical Activity: Insufficiently Active (08/08/2022)   Exercise Vital Sign    Days of Exercise per Week: 1 day    Minutes of Exercise per Session: 70 min  Stress: No Stress Concern Present (08/08/2022)   [employer] of Occupational Health - Occupational Stress Questionnaire    Feeling of Stress : Not at  all  Social Connections: Moderately Isolated (08/08/2022)   Social Connection and Isolation Panel [NHANES]    Frequency of Communication with Friends and Family: More than three times a week    Frequency of Social Gatherings with Friends and Family: Once a week    Attends Religious Services: 1 to 4 times per year    Active Member of [employer] or Organizations: No    Attends Engineer, structural: Not on file    Marital Status: Divorced  Intimate Partner Violence: Not on file  Laboratory Investigations Lab Results  Component Value Date   TSH 0.00 Repeated and verified X2. (L) 10/17/2022   TSH <0.01 (L) 08/09/2022   TSH <0.010 (L) 05/11/2022   FREET4 1.95 (H) 10/17/2022   FREET4 2.0 (H) 08/09/2022   FREET4 3.22 (H) 05/11/2022     Lab Results  Component Value Date   TSI 307 (H) 10/17/2022     No components found for: "TRAB"   Lab Results  Component Value Date   CHOL 93 05/28/2022   Lab Results  Component Value Date   HDL 36 (L) 05/28/2022   Lab Results  Component Value Date   LDLCALC 40 05/28/2022   Lab Results  Component Value Date   TRIG 90 05/28/2022   Lab Results  Component Value Date   CHOLHDL 2.6 05/28/2022   Lab Results  Component Value Date   CREATININE 0.66 08/09/2022   No results found for: "GFR"    Component Value Date/Time   NA 142 08/09/2022 0910   NA 140 06/14/2022 1613   K 4.0 08/09/2022 0910   CL 110 08/09/2022 0910   CO2 26 08/09/2022 0910   GLUCOSE 68 08/09/2022 0910   BUN 10 08/09/2022 0910   BUN 12 06/14/2022 1613   CREATININE 0.66 08/09/2022 0910   CALCIUM 9.2 08/09/2022 0910   PROT 7.0 05/11/2022 0843   ALBUMIN 3.5 05/11/2022 0843   AST 50 (H) 05/11/2022 0843   ALT 39 05/11/2022 0843   ALKPHOS 62 05/11/2022 0843   BILITOT 0.4 05/11/2022 0843   GFRNONAA >60 05/11/2022 0843   GFRNONAA 77 07/15/2014 1616   GFRAA >60 12/17/2016 0531   GFRAA 89 07/15/2014 1616      Latest Ref Rng & Units 08/09/2022    9:10 AM 06/14/2022     4:13 PM 06/11/2022   11:48 AM  BMP  Glucose 65 - 99 mg/dL 68  79  93   BUN 7 - 25 mg/dL 10  12  19    Creatinine 0.50 - 0.99 mg/dL 0.98  1.19  1.47   BUN/Creat Ratio 6 - 22 (calc) SEE NOTE:  19    Sodium 135 - 146 mmol/L 142  140  139   Potassium 3.5 - 5.3 mmol/L 4.0  4.2  5.8   Chloride 98 - 110 mmol/L 110  103  106   CO2 20 - 32 mmol/L 26  23    Calcium 8.6 - 10.2 mg/dL 9.2  9.4         Component Value Date/Time   WBC 6.7 06/11/2022 1138   RBC 4.50 06/11/2022 1138   HGB 13.6 06/11/2022 1148   HCT 40.0 06/11/2022 1148   PLT 276 06/11/2022 1138   MCV 88.0 06/11/2022 1138   MCV 96.2 07/08/2012 1708   MCH 28.9 06/11/2022 1138   MCHC 32.8 06/11/2022 1138   RDW 12.7 06/11/2022 1138   LYMPHSABS 1.4 05/11/2022 0843   MONOABS 0.5 05/11/2022 0843   EOSABS 0.1 05/11/2022 0843   BASOSABS 0.0 05/11/2022 0843      Parts of this note may have been dictated using voice recognition software. There may be variances in spelling and vocabulary which are unintentional. Not all errors are proofread. Please notify the Thereasa Parkin if any discrepancies are noted or if the meaning of any statement is not clear.

## 2022-11-08 ENCOUNTER — Other Ambulatory Visit: Payer: Self-pay | Admitting: Cardiology

## 2022-11-08 ENCOUNTER — Ambulatory Visit: Payer: BC Managed Care – PPO | Attending: Cardiology | Admitting: Cardiology

## 2022-11-27 ENCOUNTER — Ambulatory Visit (HOSPITAL_COMMUNITY)
Admission: RE | Admit: 2022-11-27 | Discharge: 2022-11-27 | Disposition: A | Discharge status: Home / Self Care | Payer: BC Managed Care – PPO | Source: Ambulatory Visit | Attending: "Endocrinology | Admitting: "Endocrinology

## 2022-11-27 DIAGNOSIS — E0500 Thyrotoxicosis with diffuse goiter without thyrotoxic crisis or storm: Secondary | ICD-10-CM | POA: Diagnosis not present

## 2022-11-27 DIAGNOSIS — E0789 Other specified disorders of thyroid: Secondary | ICD-10-CM | POA: Diagnosis not present

## 2022-12-04 ENCOUNTER — Other Ambulatory Visit: Payer: BC Managed Care – PPO

## 2022-12-06 ENCOUNTER — Encounter: Payer: Self-pay | Admitting: Cardiology

## 2022-12-08 ENCOUNTER — Emergency Department (HOSPITAL_BASED_OUTPATIENT_CLINIC_OR_DEPARTMENT_OTHER): Payer: BC Managed Care – PPO

## 2022-12-08 ENCOUNTER — Other Ambulatory Visit: Payer: Self-pay

## 2022-12-08 ENCOUNTER — Emergency Department (HOSPITAL_BASED_OUTPATIENT_CLINIC_OR_DEPARTMENT_OTHER)
Admission: EM | Admit: 2022-12-08 | Discharge: 2022-12-08 | Disposition: A | Discharge status: Home / Self Care | Payer: BC Managed Care – PPO | Attending: Emergency Medicine | Admitting: Emergency Medicine

## 2022-12-08 ENCOUNTER — Encounter (HOSPITAL_BASED_OUTPATIENT_CLINIC_OR_DEPARTMENT_OTHER): Payer: Self-pay

## 2022-12-08 DIAGNOSIS — Z23 Encounter for immunization: Secondary | ICD-10-CM | POA: Insufficient documentation

## 2022-12-08 DIAGNOSIS — Z0489 Encounter for examination and observation for other specified reasons: Secondary | ICD-10-CM | POA: Diagnosis not present

## 2022-12-08 DIAGNOSIS — S51851A Open bite of right forearm, initial encounter: Secondary | ICD-10-CM | POA: Insufficient documentation

## 2022-12-08 DIAGNOSIS — S199XXA Unspecified injury of neck, initial encounter: Secondary | ICD-10-CM | POA: Diagnosis not present

## 2022-12-08 DIAGNOSIS — R519 Headache, unspecified: Secondary | ICD-10-CM | POA: Diagnosis not present

## 2022-12-08 DIAGNOSIS — I10 Essential (primary) hypertension: Secondary | ICD-10-CM | POA: Diagnosis not present

## 2022-12-08 DIAGNOSIS — S0990XA Unspecified injury of head, initial encounter: Secondary | ICD-10-CM | POA: Insufficient documentation

## 2022-12-08 DIAGNOSIS — Z79899 Other long term (current) drug therapy: Secondary | ICD-10-CM | POA: Insufficient documentation

## 2022-12-08 DIAGNOSIS — S0083XA Contusion of other part of head, initial encounter: Secondary | ICD-10-CM | POA: Diagnosis not present

## 2022-12-08 DIAGNOSIS — M542 Cervicalgia: Secondary | ICD-10-CM | POA: Diagnosis not present

## 2022-12-08 MED ORDER — TETANUS-DIPHTH-ACELL PERTUSSIS 5-2.5-18.5 LF-MCG/0.5 IM SUSY
0.5000 mL | PREFILLED_SYRINGE | Freq: Once | INTRAMUSCULAR | Status: AC
  Administered 2022-12-08: 0.5 mL via INTRAMUSCULAR
  Filled 2022-12-08: qty 0.5

## 2022-12-08 MED ORDER — ACETAMINOPHEN 500 MG PO TABS
1000.0000 mg | ORAL_TABLET | Freq: Once | ORAL | Status: AC
  Administered 2022-12-08: 1000 mg via ORAL
  Filled 2022-12-08: qty 2

## 2022-12-08 MED ORDER — ONDANSETRON 4 MG PO TBDP
4.0000 mg | ORAL_TABLET | Freq: Once | ORAL | Status: AC
  Administered 2022-12-08: 4 mg via ORAL
  Filled 2022-12-08: qty 1

## 2022-12-08 MED ORDER — AMOXICILLIN-POT CLAVULANATE 875-125 MG PO TABS: 1.0000 | ORAL_TABLET | Freq: Two times a day (BID) | ORAL | 0 refills | Status: AC

## 2022-12-08 MED ORDER — OXYCODONE HCL 5 MG PO TABS
5.0000 mg | ORAL_TABLET | Freq: Once | ORAL | Status: AC
  Administered 2022-12-08: 5 mg via ORAL
  Filled 2022-12-08: qty 1

## 2022-12-08 MED ORDER — CYCLOBENZAPRINE HCL 10 MG PO TABS: 10.0000 mg | ORAL_TABLET | Freq: Three times a day (TID) | ORAL | 0 refills | Status: AC | PRN

## 2022-12-08 MED ORDER — AMOXICILLIN-POT CLAVULANATE 875-125 MG PO TABS
1.0000 | ORAL_TABLET | Freq: Once | ORAL | Status: AC
  Administered 2022-12-08: 1 via ORAL
  Filled 2022-12-08: qty 1

## 2022-12-08 MED ORDER — IBUPROFEN 600 MG PO TABS: 600.0000 mg | ORAL_TABLET | Freq: Three times a day (TID) | ORAL | 0 refills | Status: AC | PRN
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
## 2022-12-11 ENCOUNTER — Ambulatory Visit (INDEPENDENT_AMBULATORY_CARE_PROVIDER_SITE_OTHER): Payer: BC Managed Care – PPO | Admitting: "Endocrinology

## 2022-12-11 ENCOUNTER — Encounter: Payer: Self-pay | Admitting: "Endocrinology

## 2022-12-11 VITALS — BP 120/90 | HR 80 | Ht 71.0 in | Wt 269.4 lb

## 2022-12-11 DIAGNOSIS — E010 Iodine-deficiency related diffuse (endemic) goiter: Secondary | ICD-10-CM

## 2022-12-11 DIAGNOSIS — E0500 Thyrotoxicosis with diffuse goiter without thyrotoxic crisis or storm: Secondary | ICD-10-CM

## 2022-12-11 DIAGNOSIS — F17200 Nicotine dependence, unspecified, uncomplicated: Secondary | ICD-10-CM | POA: Diagnosis not present

## 2022-12-11 LAB — TSH: TSH: <0.01 u[IU]/mL — ABNORMAL LOW (ref 0.35–5.50)

## 2022-12-11 LAB — T3, FREE: T3, Free: 3.5 pg/mL (ref 2.3–4.2)

## 2022-12-11 LAB — T4, FREE: Free T4: 0.79 ng/dL (ref 0.60–1.60)

## 2022-12-11 NOTE — Progress Notes (Signed)
Outpatient Endocrinology Note [name] [place], MD  12/11/22   Anne [name] Aug 17, 1980 [id number]  Referring Provider: Gillis Santa* Primary Care Provider: Gillis Santa, NP Subjective  No chief complaint on file.   Assessment & Plan  Diagnoses and all orders for this visit:  Graves disease  Thyromegaly  Smoking     Anne [name] is currently on methimazole 10 mg thrice a day. Stopped methimazole previously she was told to be in remission.  Patient currently clinically euthyroid.  Educated on thyroid axis.  Discussed the etiology for hyperthyroidism. Educated on thyroid axis.  Recommend the following: Repeat labs today. Repeat labs in 3 months or sooner if symptoms of hyper or hypothyroidism develop.  Educated on definitive options of treatment including RAI therapy and surgery. Patient does not want RAI at this time.  Counseled on: -complications of untreated hyperthyroidism including atrial fibrillation, heart failure and osteoporosis -side effects of Methimazole including but not limited to allergic reaction, rash, bone marrow suppression, liver dysfunction and teratogenic potential -implications in pregnancy and breastfeeding -compliance and follow up needs    Thyromegaly noted in 2018 thyroid ultrasound Patient c/o obstructive symptoms 11/27/22 thyroid ultrasound: reviewed images, thyromegaly, no nodules  The substances in cigarettes and possibly in vaping products can affect the autoimmune processes underlying Graves' disease The patient was counseled on the dangers of tobacco use, and was advised to quit.  Reviewed strategies to maximize success, including removing cigarettes and smoking materials from environment, stress management, support of family/friends, written materials and local smoking cessation programs. Encouraged to contact PCP for any help needed such as patches or pills to curb nicotine dependence . Patient understands  and agreed   I have reviewed current medications, nurse's notes, allergies, vital signs, past medical and surgical history, family medical history, and social history for this encounter. Counseled patient on symptoms, examination findings, lab findings, imaging results, treatment decisions and monitoring and prognosis. The patient understood the recommendations and agrees with the treatment plan. All questions regarding treatment plan were fully answered.   Return in about 3 months (around 03/13/2023) for visit + labs before next visit, labs today.   [name] [place], MD  12/11/22   I have reviewed current medications, nurse's notes, allergies, vital signs, past medical and surgical history, family medical history, and social history for this encounter. Counseled patient on symptoms, examination findings, lab findings, imaging results, treatment decisions and monitoring and prognosis. The patient understood the recommendations and agrees with the treatment plan. All questions regarding treatment plan were fully answered.   History of Present Illness Anne [name] is a 42 y.o. year old female who presents to our clinic with graves disease.   TSH was undetectable in 2019 with elevated FT4 and FT3.    On  Symptoms suggestive of HYPERTHYROIDISM:  weight loss  No, weigh gain yes  Heat/cold intolerance No Hyperdefecation/constipation  No palpitations  Yes, improved   Compressive symptoms:  dysphagia  No dysphonia  No positional dyspnea (especially with simultaneous arms elevation)  No  Smokes  Yes On biotin  No Personal history of head/neck surgery/irradiation  No  Adverse Drug Effects from Methimazole (MMI): none, except low appetite   Grave's Ophthalmopathy Clinical Activity Score: 0/9, reports eye itching  __________  11/2022 THYROID ULTRASOUND   COMPARISON:  12/15/2016   FINDINGS: Parenchymal Echotexture: Moderately heterogenous   Isthmus: 0.6 cm   Right lobe: 6.6 x 3.4  x 2.8 cm   Left lobe: 6.7 x  2.9 x 2.8 cm   _________________________________________________________   Estimated total number of nodules >/= 1 cm: 0   Number of spongiform nodules >/=  2 cm not described below (TR1): 0   Number of mixed cystic and solid nodules >/= 1.5 cm not described below (TR2): 0   _________________________________________________________   No discrete nodules are seen within the thyroid gland.   IMPRESSION: Moderate diffuse heterogeneity of the thyroid without discrete nodule.   _______________  12/15/2016  THYROID ULTRASOUND   TECHNIQUE: Ultrasound examination of the thyroid gland and adjacent soft tissues was performed.   COMPARISON:  None.   FINDINGS: Parenchymal Echotexture: Mildly heterogenous   Isthmus: 0.7 cm thickness   Right lobe: 6.6 x 3.4 x 3.1 cm   Left lobe: 6.6 x 3.5 x 3 cm   _________________________________________________________   Estimated total number of nodules >/= 1 cm: 0   Number of spongiform nodules >/=  2 cm not described below (TR1): 0   Number of mixed cystic and solid nodules >/= 1.5 cm not described below (TR2): 0   _________________________________________________________   No discrete nodules identified.   IMPRESSION: Thyromegaly with mild heterogeneity, no nodule.  Physical Exam  BP (!) 120/90   Pulse 80   Ht 5\' 11"  (1.803 m)   Wt 269 lb 6.4 oz (122.2 kg)   LMP 11/30/2022 (Exact Date)   SpO2 98%   BMI 37.57 kg/m  Constitutional: well developed, well nourished Head: normocephalic, atraumatic, no exophthalmos Eyes: sclera anicteric, no redness Neck: no thyromegaly, no thyroid tenderness; no nodules palpated Lungs: normal respiratory effort Neurology: alert and oriented, no fine hand tremor Skin: dry, no appreciable rashes Musculoskeletal: no appreciable defects Psychiatric: normal mood and affect  Allergies Allergies  Allergen Reactions   Food Allergy Formula Anaphylaxis and Swelling     coconut   Shellfish Allergy Swelling    Current Medications Patient's Medications  New Prescriptions   No medications on file  Previous Medications   AMLODIPINE (NORVASC) 5 MG TABLET    Take 1 tablet (5 mg total) by mouth daily.   AMOXICILLIN-CLAVULANATE (AUGMENTIN) 875-125 MG TABLET    Take 1 tablet by mouth 2 (two) times daily for 5 days.   CYCLOBENZAPRINE (FLEXERIL) 10 MG TABLET    Take 1 tablet (10 mg total) by mouth 3 (three) times daily as needed for up to 3 days for muscle spasms.   HYDROCHLOROTHIAZIDE (HYDRODIURIL) 12.5 MG TABLET    TAKE 1 TABLET BY MOUTH EVERY DAY   IBUPROFEN (ADVIL) 600 MG TABLET    Take 1 tablet (600 mg total) by mouth every 8 (eight) hours as needed for up to 3 days for mild pain or moderate pain.   MELATONIN 5 MG TABS    Take 1 tablet (5 mg total) by mouth at bedtime.   METHIMAZOLE (TAPAZOLE) 10 MG TABLET    Take 3 tablets (30 mg total) by mouth daily.   METOPROLOL TARTRATE (LOPRESSOR) 25 MG TABLET    Take 25 mg by mouth 2 (two) times daily.   NICOTINE (NICODERM CQ - DOSED IN MG/24 HOURS) 21 MG/24HR PATCH    Place 1 patch (21 mg total) onto the skin daily.  Modified Medications   No medications on file  Discontinued Medications   No medications on file    Past Medical History Past Medical History:  Diagnosis Date   Abnormal echocardiogram    a. 11/2016 - increased LVOT gradient. see 01/09/2017 cardiology OV in Epic   Asthma  rarely uses, only when sick   Chronic midline low back pain without sciatica    off and on   COVID-19 05/11/2022   given Paxlovid, pt stated she did not take due to being unable to swallow pills   GERD (gastroesophageal reflux disease)    Graves disease    Follows w/ Dr. Sharl Ma, endocronologist with [facility].   Heart murmur    12/14/16 echocardiogram in Epic   Hypertension    Follows w/ PCP, Monina Medina-Vargus, NP.   Mild mitral regurgitation    12/14/16 echo in Epic   Normocytic anemia    Obesity     Thyrotoxicosis 11/2014   Tobacco abuse    As of 2024, smoking hx of 20 years.   Tricuspid regurgitation    12/14/16 echo in Epic   Wears glasses     Past Surgical History Past Surgical History:  Procedure Laterality Date   ECTOPIC PREGNANCY SURGERY  2012   MANDIBLE SURGERY     around 2002   TUBAL LIGATION     one tube, ruptured during ectopic pregancy    Family History family history includes Cervical cancer in her mother; Diabetes in her maternal aunt, maternal aunt, and maternal grandmother; Heart Problems in her maternal aunt; Heart murmur in her paternal grandmother; Hypertension in her father; Kidney disease in her maternal aunt; Thyroid disease in her maternal grandmother and mother.  Social History Social History   Socioeconomic History   Marital status: Divorced    Spouse name: Not on file   Number of children: Not on file   Years of education: Not on file   Highest education level: GED or equivalent  Occupational History   Not on file  Tobacco Use   Smoking status: Every Day    Current packs/day: 1.00    Average packs/day: 1 pack/day for 20.0 years (20.0 ttl pk-yrs)    Types: Cigarettes   Smokeless tobacco: Never  Vaping Use   Vaping status: Former   Start date: 04/24/2015   Quit date: 04/23/2016  Substance and Sexual Activity   Alcohol use: Yes    Alcohol/week: 1.0 standard drink of alcohol    Types: 1 Glasses of wine per week    Comment: few beers and liquor on the weekend   Drug use: No   Sexual activity: Yes    Birth control/protection: None  Other Topics Concern   Not on file  Social History Narrative   Not on file   Social Determinants of Health   Financial Resource Strain: Low Risk  (08/08/2022)   Overall Financial Resource Strain (CARDIA)    Difficulty of Paying Living Expenses: Not very hard  Recent Concern: Financial Resource Strain - Medium Risk (08/05/2022)   Overall Financial Resource Strain (CARDIA)    Difficulty of Paying Living Expenses:  Somewhat hard  Food Insecurity: Food Insecurity Present (08/08/2022)   Hunger Vital Sign    Worried About Running Out of Food in the Last Year: Sometimes true    Ran Out of Food in the Last Year: Sometimes true  Transportation Needs: No Transportation Needs (08/08/2022)   PRAPARE - Administrator, Civil Service (Medical): No    Lack of Transportation (Non-Medical): No  Physical Activity: Insufficiently Active (08/08/2022)   Exercise Vital Sign    Days of Exercise per Week: 1 day    Minutes of Exercise per Session: 70 min  Stress: No Stress Concern Present (08/08/2022)   [employer] of Occupational Health - Occupational  Stress Questionnaire    Feeling of Stress : Not at all  Social Connections: Moderately Isolated (08/08/2022)   Social Connection and Isolation Panel [NHANES]    Frequency of Communication with Friends and Family: More than three times a week    Frequency of Social Gatherings with Friends and Family: Once a week    Attends Religious Services: 1 to 4 times per year    Active Member of [employer] or Organizations: No    Attends Engineer, structural: Not on file    Marital Status: Divorced  Intimate Partner Violence: Not on file    Laboratory Investigations Lab Results  Component Value Date   TSH 0.00 Repeated and verified X2. (L) 10/17/2022   TSH <0.01 (L) 08/09/2022   TSH <0.010 (L) 05/11/2022   FREET4 1.95 (H) 10/17/2022   FREET4 2.0 (H) 08/09/2022   FREET4 3.22 (H) 05/11/2022     Lab Results  Component Value Date   TSI 307 (H) 10/17/2022     No components found for: "TRAB"   Lab Results  Component Value Date   CHOL 93 05/28/2022   Lab Results  Component Value Date   HDL 36 (L) 05/28/2022   Lab Results  Component Value Date   LDLCALC 40 05/28/2022   Lab Results  Component Value Date   TRIG 90 05/28/2022   Lab Results  Component Value Date   CHOLHDL 2.6 05/28/2022   Lab Results  Component Value Date   CREATININE 0.66  08/09/2022   No results found for: "GFR"    Component Value Date/Time   NA 142 08/09/2022 0910   NA 140 06/14/2022 1613   K 4.0 08/09/2022 0910   CL 110 08/09/2022 0910   CO2 26 08/09/2022 0910   GLUCOSE 68 08/09/2022 0910   BUN 10 08/09/2022 0910   BUN 12 06/14/2022 1613   CREATININE 0.66 08/09/2022 0910   CALCIUM 9.2 08/09/2022 0910   PROT 7.0 05/11/2022 0843   ALBUMIN 3.5 05/11/2022 0843   AST 50 (H) 05/11/2022 0843   ALT 39 05/11/2022 0843   ALKPHOS 62 05/11/2022 0843   BILITOT 0.4 05/11/2022 0843   GFRNONAA >60 05/11/2022 0843   GFRNONAA 77 07/15/2014 1616   GFRAA >60 12/17/2016 0531   GFRAA 89 07/15/2014 1616      Latest Ref Rng & Units 08/09/2022    9:10 AM 06/14/2022    4:13 PM 06/11/2022   11:48 AM  BMP  Glucose 65 - 99 mg/dL 68  79  93   BUN 7 - 25 mg/dL 10  12  19    Creatinine 0.50 - 0.99 mg/dL 6.29  5.28  4.13   BUN/Creat Ratio 6 - 22 (calc) SEE NOTE:  19    Sodium 135 - 146 mmol/L 142  140  139   Potassium 3.5 - 5.3 mmol/L 4.0  4.2  5.8   Chloride 98 - 110 mmol/L 110  103  106   CO2 20 - 32 mmol/L 26  23    Calcium 8.6 - 10.2 mg/dL 9.2  9.4         Component Value Date/Time   WBC 6.7 06/11/2022 1138   RBC 4.50 06/11/2022 1138   HGB 13.6 06/11/2022 1148   HCT 40.0 06/11/2022 1148   PLT 276 06/11/2022 1138   MCV 88.0 06/11/2022 1138   MCV 96.2 07/08/2012 1708   MCH 28.9 06/11/2022 1138   MCHC 32.8 06/11/2022 1138   RDW 12.7 06/11/2022 1138   LYMPHSABS  1.4 05/11/2022 0843   MONOABS 0.5 05/11/2022 0843   EOSABS 0.1 05/11/2022 0843   BASOSABS 0.0 05/11/2022 0843      Parts of this note may have been dictated using voice recognition software. There may be variances in spelling and vocabulary which are unintentional. Not all errors are proofread. Please notify the Thereasa Parkin if any discrepancies are noted or if the meaning of any statement is not clear.

## 2022-12-13 ENCOUNTER — Ambulatory Visit: Payer: BC Managed Care – PPO | Admitting: Adult Health

## 2022-12-13 ENCOUNTER — Encounter: Payer: Self-pay | Admitting: Adult Health

## 2022-12-13 VITALS — BP 140/90 | HR 75 | Temp 98.0°F | Ht 71.0 in | Wt 274.0 lb

## 2022-12-13 DIAGNOSIS — E0500 Thyrotoxicosis with diffuse goiter without thyrotoxic crisis or storm: Secondary | ICD-10-CM

## 2022-12-13 DIAGNOSIS — M25561 Pain in right knee: Secondary | ICD-10-CM

## 2022-12-13 DIAGNOSIS — I10 Essential (primary) hypertension: Secondary | ICD-10-CM | POA: Diagnosis not present

## 2022-12-13 DIAGNOSIS — M25562 Pain in left knee: Secondary | ICD-10-CM | POA: Diagnosis not present

## 2022-12-13 MED ORDER — TRAMADOL HCL 50 MG PO TABS
50.0000 mg | ORAL_TABLET | Freq: Two times a day (BID) | ORAL | 0 refills | Status: AC | PRN
Start: 2022-12-13 — End: 2022-12-18
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
## 2022-12-21 ENCOUNTER — Other Ambulatory Visit: Payer: Self-pay | Admitting: Adult Health

## 2022-12-21 DIAGNOSIS — I10 Essential (primary) hypertension: Secondary | ICD-10-CM

## 2022-12-21 MED ORDER — HYDROCHLOROTHIAZIDE 12.5 MG PO TABS
12.5000 mg | ORAL_TABLET | Freq: Every day | ORAL | 1 refills | Status: DC
Start: 2022-12-21 — End: 2023-07-15

## 2022-12-21 NOTE — Telephone Encounter (Signed)
Anne [name] this patient does not have a pending appointment. Please advise on when she should follow-up?

## 2023-01-03 ENCOUNTER — Other Ambulatory Visit: Payer: Self-pay | Admitting: Adult Health

## 2023-01-03 DIAGNOSIS — M25561 Pain in right knee: Secondary | ICD-10-CM

## 2023-01-22 ENCOUNTER — Other Ambulatory Visit: Payer: Self-pay | Admitting: Adult Health

## 2023-01-22 DIAGNOSIS — M545 Low back pain, unspecified: Secondary | ICD-10-CM

## 2023-01-29 ENCOUNTER — Ambulatory Visit: Payer: BC Managed Care – PPO | Admitting: "Endocrinology

## 2023-03-06 ENCOUNTER — Other Ambulatory Visit: Payer: Self-pay

## 2023-03-06 DIAGNOSIS — E0500 Thyrotoxicosis with diffuse goiter without thyrotoxic crisis or storm: Secondary | ICD-10-CM

## 2023-03-07 ENCOUNTER — Other Ambulatory Visit: Payer: BC Managed Care – PPO

## 2023-03-11 ENCOUNTER — Other Ambulatory Visit (INDEPENDENT_AMBULATORY_CARE_PROVIDER_SITE_OTHER): Payer: BC Managed Care – PPO

## 2023-03-11 DIAGNOSIS — E0500 Thyrotoxicosis with diffuse goiter without thyrotoxic crisis or storm: Secondary | ICD-10-CM

## 2023-03-12 LAB — T4, FREE: Free T4: 0.31 ng/dL — ABNORMAL LOW (ref 0.60–1.60)

## 2023-03-12 LAB — TSH: TSH: 6.71 u[IU]/mL — ABNORMAL HIGH (ref 0.35–5.50)

## 2023-03-12 LAB — T3, FREE: T3, Free: 3.2 pg/mL (ref 2.3–4.2)

## 2023-03-13 ENCOUNTER — Ambulatory Visit (INDEPENDENT_AMBULATORY_CARE_PROVIDER_SITE_OTHER): Payer: BC Managed Care – PPO | Admitting: "Endocrinology

## 2023-03-13 ENCOUNTER — Encounter: Payer: Self-pay | Admitting: "Endocrinology

## 2023-03-13 VITALS — BP 122/80 | HR 86 | Ht 71.0 in | Wt 279.0 lb

## 2023-03-13 DIAGNOSIS — F17200 Nicotine dependence, unspecified, uncomplicated: Secondary | ICD-10-CM | POA: Diagnosis not present

## 2023-03-13 DIAGNOSIS — E0500 Thyrotoxicosis with diffuse goiter without thyrotoxic crisis or storm: Secondary | ICD-10-CM | POA: Diagnosis not present

## 2023-03-13 NOTE — Progress Notes (Signed)
Outpatient Endocrinology Note Anne [place], MD  03/13/23   Anne [name] 29-Mar-1981 [id number]  Referring Provider: Gillis [name]* Primary Care Provider: Gillis Santa, NP Subjective  No chief complaint on file.   Assessment & Plan  Diagnoses and all orders for this visit:  Graves disease -     T4, free; Future -     T3, free; Future -     TSH; Future  Smoking   ZYKERRIA GIBILISCO is currently on methimazole 10 mg thrice a day. Stopped methimazole previously she was told to be in remission.  Patient currently clinically euthyroid.  Educated on thyroid axis.  Discussed the etiology for hyperthyroidism. Educated on thyroid axis.  Recommend the following: methimazole 10 mg twice a day. Repeat labs sooner if symptoms of hyper or hypothyroidism develop.  Educated on definitive options of treatment including RAI therapy and surgery. Patient does not want RAI at this time.  Counseled on: -complications of untreated hyperthyroidism including atrial fibrillation, heart failure and osteoporosis -side effects of Methimazole including but not limited to allergic reaction, rash, bone marrow suppression, liver dysfunction and teratogenic potential -implications in pregnancy and breastfeeding -compliance and follow up needs    Thyromegaly noted in 2018 thyroid ultrasound Patient c/o obstructive symptoms 11/27/22 thyroid ultrasound: reviewed images, thyromegaly, no nodules  The substances in cigarettes and possibly in vaping products can affect the autoimmune processes underlying Graves' disease The patient was counseled on the dangers of tobacco use, and was advised to quit.  Reviewed strategies to maximize success, including removing cigarettes and smoking materials from environment, stress management, support of family/friends, written materials and local smoking cessation programs. Encouraged to contact PCP for any help needed such as patches or pills to  curb nicotine dependence . Patient understands and agreed   I have reviewed current medications, nurse's notes, allergies, vital signs, past medical and surgical history, family medical history, and social history for this encounter. Counseled patient on symptoms, examination findings, lab findings, imaging results, treatment decisions and monitoring and prognosis. The patient understood the recommendations and agrees with the treatment plan. All questions regarding treatment plan were fully answered.   Return in about 6 weeks (around 04/24/2023) for visit + labs before next visit.   Anne [place], MD  03/13/23   I have reviewed current medications, nurse's notes, allergies, vital signs, past medical and surgical history, family medical history, and social history for this encounter. Counseled patient on symptoms, examination findings, lab findings, imaging results, treatment decisions and monitoring and prognosis. The patient understood the recommendations and agrees with the treatment plan. All questions regarding treatment plan were fully answered.   History of Present Illness Anne [name] is a 42 y.o. year old female who presents to our clinic with graves disease.   TSH was undetectable in 2019 with elevated FT4 and FT3.    On  Symptoms suggestive of HYPERTHYROIDISM:  weight loss  No, weigh gain yes  Heat/cold intolerance No Hyperdefecation/constipation  Yes some days constipation  palpitations  Yes, improved   Compressive symptoms:  dysphagia  No dysphonia  No positional dyspnea (especially with simultaneous arms elevation)  No  Smokes  Yes On biotin  No Personal history of head/neck surgery/irradiation  No  Adverse Drug Effects from Methimazole (MMI): none, except low appetite   Grave's Ophthalmopathy Clinical Activity Score: 0/9, reports eye itching  __________  11/2022 THYROID ULTRASOUND   COMPARISON:  12/15/2016   FINDINGS: Parenchymal Echotexture: Moderately  heterogenous  Isthmus: 0.6 cm   Right lobe: 6.6 x 3.4 x 2.8 cm   Left lobe: 6.7 x 2.9 x 2.8 cm   _________________________________________________________   Estimated total number of nodules >/= 1 cm: 0   Number of spongiform nodules >/=  2 cm not described below (TR1): 0   Number of mixed cystic and solid nodules >/= 1.5 cm not described below (TR2): 0   _________________________________________________________   No discrete nodules are seen within the thyroid gland.   IMPRESSION: Moderate diffuse heterogeneity of the thyroid without discrete nodule.   _______________  12/15/2016  THYROID ULTRASOUND   TECHNIQUE: Ultrasound examination of the thyroid gland and adjacent soft tissues was performed.   COMPARISON:  None.   FINDINGS: Parenchymal Echotexture: Mildly heterogenous   Isthmus: 0.7 cm thickness   Right lobe: 6.6 x 3.4 x 3.1 cm   Left lobe: 6.6 x 3.5 x 3 cm   _________________________________________________________   Estimated total number of nodules >/= 1 cm: 0   Number of spongiform nodules >/=  2 cm not described below (TR1): 0   Number of mixed cystic and solid nodules >/= 1.5 cm not described below (TR2): 0   _________________________________________________________   No discrete nodules identified.   IMPRESSION: Thyromegaly with mild heterogeneity, no nodule.  Physical Exam  BP 122/80   Pulse 86   Ht 5\' 11"  (1.803 m)   Wt 279 lb (126.6 kg)   SpO2 98%   BMI 38.91 kg/m  Constitutional: well developed, well nourished Head: normocephalic, atraumatic, no exophthalmos Eyes: sclera anicteric, no redness Neck: no thyromegaly, no thyroid tenderness; no nodules palpated Lungs: normal respiratory effort Neurology: alert and oriented, no fine hand tremor Skin: dry, no appreciable rashes Musculoskeletal: no appreciable defects Psychiatric: normal mood and affect  Allergies Allergies  Allergen Reactions   Food Allergy Formula  Anaphylaxis and Swelling    coconut   Shellfish Allergy Swelling    Current Medications Patient's Medications  New Prescriptions   No medications on file  Previous Medications   AMLODIPINE (NORVASC) 5 MG TABLET    Take 1 tablet (5 mg total) by mouth daily.   HYDROCHLOROTHIAZIDE (HYDRODIURIL) 12.5 MG TABLET    Take 1 tablet (12.5 mg total) by mouth daily.   MELATONIN 5 MG TABS    Take 1 tablet (5 mg total) by mouth at bedtime.   METHIMAZOLE (TAPAZOLE) 10 MG TABLET    Take 3 tablets (30 mg total) by mouth daily.   METOPROLOL TARTRATE (LOPRESSOR) 25 MG TABLET    Take 25 mg by mouth 2 (two) times daily.   NICOTINE (NICODERM CQ - DOSED IN MG/24 HOURS) 21 MG/24HR PATCH    Place 1 patch (21 mg total) onto the skin daily.  Modified Medications   No medications on file  Discontinued Medications   No medications on file    Past Medical History Past Medical History:  Diagnosis Date   Abnormal echocardiogram    a. 11/2016 - increased LVOT gradient. see 01/09/2017 cardiology OV in Epic   Asthma    rarely uses, only when sick   Chronic midline low back pain without sciatica    off and on   COVID-19 05/11/2022   given Paxlovid, pt stated she did not take due to being unable to swallow pills   GERD (gastroesophageal reflux disease)    Graves disease    Follows w/ Dr. Sharl Ma, endocronologist with [facility].   Heart murmur    12/14/16 echocardiogram in Epic  Hypertension    Follows w/ PCP, Monina Medina-Vargus, NP.   Mild mitral regurgitation    12/14/16 echo in Epic   Normocytic anemia    Obesity    Thyrotoxicosis 11/2014   Tobacco abuse    As of 2024, smoking hx of 20 years.   Tricuspid regurgitation    12/14/16 echo in Epic   Wears glasses     Past Surgical History Past Surgical History:  Procedure Laterality Date   ECTOPIC PREGNANCY SURGERY  2012   MANDIBLE SURGERY     around 2002   TUBAL LIGATION     one tube, ruptured during ectopic pregancy    Family  History family history includes Cervical cancer in her mother; Diabetes in her maternal aunt, maternal aunt, and maternal grandmother; Heart Problems in her maternal aunt; Heart murmur in her paternal grandmother; Hypertension in her father; Kidney disease in her maternal aunt; Thyroid disease in her maternal grandmother and mother.  Social History Social History   Socioeconomic History   Marital status: Divorced    Spouse name: Not on file   Number of children: Not on file   Years of education: Not on file   Highest education level: GED or equivalent  Occupational History   Not on file  Tobacco Use   Smoking status: Every Day    Current packs/day: 1.00    Average packs/day: 1 pack/day for 20.0 years (20.0 ttl pk-yrs)    Types: Cigarettes   Smokeless tobacco: Never  Vaping Use   Vaping status: Former   Start date: 04/24/2015   Quit date: 04/23/2016  Substance and Sexual Activity   Alcohol use: Yes    Alcohol/week: 1.0 standard drink of alcohol    Types: 1 Glasses of wine per week    Comment: few beers and liquor on the weekend   Drug use: No   Sexual activity: Yes    Birth control/protection: None  Other Topics Concern   Not on file  Social History Narrative   Not on file   Social Determinants of Health   Financial Resource Strain: Low Risk  (08/08/2022)   Overall Financial Resource Strain (CARDIA)    Difficulty of Paying Living Expenses: Not very hard  Recent Concern: Financial Resource Strain - Medium Risk (08/05/2022)   Overall Financial Resource Strain (CARDIA)    Difficulty of Paying Living Expenses: Somewhat hard  Food Insecurity: Food Insecurity Present (08/08/2022)   Hunger Vital Sign    Worried About Running Out of Food in the Last Year: Sometimes true    Ran Out of Food in the Last Year: Sometimes true  Transportation Needs: No Transportation Needs (08/08/2022)   PRAPARE - Administrator, Civil Service (Medical): No    Lack of Transportation  (Non-Medical): No  Physical Activity: Insufficiently Active (08/08/2022)   Exercise Vital Sign    Days of Exercise per Week: 1 day    Minutes of Exercise per Session: 70 min  Stress: No Stress Concern Present (08/08/2022)   [employer] of Occupational Health - Occupational Stress Questionnaire    Feeling of Stress : Not at all  Social Connections: Moderately Isolated (08/08/2022)   Social Connection and Isolation Panel [NHANES]    Frequency of Communication with Friends and Family: More than three times a week    Frequency of Social Gatherings with Friends and Family: Once a week    Attends Religious Services: 1 to 4 times per year    Active Member of Clubs or  Organizations: No    Attends Banker Meetings: Not on file    Marital Status: Divorced  Intimate Partner Violence: Not on file    Laboratory Investigations Lab Results  Component Value Date   TSH 6.71 (H) 03/11/2023   TSH <0.01 (L) 12/11/2022   TSH 0.00 Repeated and verified X2. (L) 10/17/2022   FREET4 0.31 (L) 03/11/2023   FREET4 0.79 12/11/2022   FREET4 1.95 (H) 10/17/2022     Lab Results  Component Value Date   TSI 307 (H) 10/17/2022     No components found for: "TRAB"   Lab Results  Component Value Date   CHOL 93 05/28/2022   Lab Results  Component Value Date   HDL 36 (L) 05/28/2022   Lab Results  Component Value Date   LDLCALC 40 05/28/2022   Lab Results  Component Value Date   TRIG 90 05/28/2022   Lab Results  Component Value Date   CHOLHDL 2.6 05/28/2022   Lab Results  Component Value Date   CREATININE 0.66 08/09/2022   No results found for: "GFR"    Component Value Date/Time   NA 142 08/09/2022 0910   NA 140 06/14/2022 1613   K 4.0 08/09/2022 0910   CL 110 08/09/2022 0910   CO2 26 08/09/2022 0910   GLUCOSE 68 08/09/2022 0910   BUN 10 08/09/2022 0910   BUN 12 06/14/2022 1613   CREATININE 0.66 08/09/2022 0910   CALCIUM 9.2 08/09/2022 0910   PROT 7.0 05/11/2022 0843    ALBUMIN 3.5 05/11/2022 0843   AST 50 (H) 05/11/2022 0843   ALT 39 05/11/2022 0843   ALKPHOS 62 05/11/2022 0843   BILITOT 0.4 05/11/2022 0843   GFRNONAA >60 05/11/2022 0843   GFRNONAA 77 07/15/2014 1616   GFRAA >60 12/17/2016 0531   GFRAA 89 07/15/2014 1616      Latest Ref Rng & Units 08/09/2022    9:10 AM 06/14/2022    4:13 PM 06/11/2022   11:48 AM  BMP  Glucose 65 - 99 mg/dL 68  79  93   BUN 7 - 25 mg/dL 10  12  19    Creatinine 0.50 - 0.99 mg/dL 1.61  0.96  0.45   BUN/Creat Ratio 6 - 22 (calc) SEE NOTE:  19    Sodium 135 - 146 mmol/L 142  140  139   Potassium 3.5 - 5.3 mmol/L 4.0  4.2  5.8   Chloride 98 - 110 mmol/L 110  103  106   CO2 20 - 32 mmol/L 26  23    Calcium 8.6 - 10.2 mg/dL 9.2  9.4         Component Value Date/Time   WBC 6.7 06/11/2022 1138   RBC 4.50 06/11/2022 1138   HGB 13.6 06/11/2022 1148   HCT 40.0 06/11/2022 1148   PLT 276 06/11/2022 1138   MCV 88.0 06/11/2022 1138   MCV 96.2 07/08/2012 1708   MCH 28.9 06/11/2022 1138   MCHC 32.8 06/11/2022 1138   RDW 12.7 06/11/2022 1138   LYMPHSABS 1.4 05/11/2022 0843   MONOABS 0.5 05/11/2022 0843   EOSABS 0.1 05/11/2022 0843   BASOSABS 0.0 05/11/2022 0843      Parts of this note may have been dictated using voice recognition software. There may be variances in spelling and vocabulary which are unintentional. Not all errors are proofread. Please notify the Thereasa Parkin if any discrepancies are noted or if the meaning of any statement is not clear.

## 2023-04-01 ENCOUNTER — Telehealth: Payer: Self-pay | Admitting: Cardiology

## 2023-04-01 NOTE — Telephone Encounter (Signed)
Patient c/o Palpitations:  STAT if patient reporting lightheadedness, shortness of breath, or chest pain  How long have you had palpitations/irregular HR/ Afib? Are you having the symptoms now? 2 weeks , it has been racing   Are you currently experiencing lightheadedness, SOB or CP? A little chest for about 2 week now  Do you have a history of afib (atrial fibrillation) or irregular heart rhythm? No   Have you checked your BP or HR? (document readings if available): Pt stated it has been normal.  She went to PCP last week and it was norrmal   Are you experiencing any other symptoms? Left breast acking     Best number [phone]

## 2023-04-01 NOTE — Telephone Encounter (Signed)
Spoke with pt, she is reporting fluttering of her heart and occ chest pain. She really did not want to talk about what is going on, she just wanted an appointment. First available appointment scheduled.

## 2023-04-08 ENCOUNTER — Ambulatory Visit: Payer: BC Managed Care – PPO | Admitting: Cardiology

## 2023-04-22 ENCOUNTER — Other Ambulatory Visit: Payer: Self-pay

## 2023-04-22 ENCOUNTER — Other Ambulatory Visit: Payer: BC Managed Care – PPO

## 2023-04-22 DIAGNOSIS — E0500 Thyrotoxicosis with diffuse goiter without thyrotoxic crisis or storm: Secondary | ICD-10-CM

## 2023-05-01 ENCOUNTER — Ambulatory Visit: Payer: BC Managed Care – PPO | Attending: Cardiology | Admitting: Cardiology

## 2023-05-21 ENCOUNTER — Ambulatory Visit (INDEPENDENT_AMBULATORY_CARE_PROVIDER_SITE_OTHER): Payer: BC Managed Care – PPO | Admitting: Sports Medicine

## 2023-05-21 ENCOUNTER — Encounter: Payer: Self-pay | Admitting: Sports Medicine

## 2023-05-21 ENCOUNTER — Ambulatory Visit
Admission: RE | Admit: 2023-05-21 | Discharge: 2023-05-21 | Disposition: A | Discharge status: Home / Self Care | Payer: BC Managed Care – PPO | Source: Ambulatory Visit | Attending: Sports Medicine

## 2023-05-21 VITALS — BP 162/110 | HR 76 | Temp 97.5°F | Resp 17 | Ht 71.0 in | Wt 285.4 lb

## 2023-05-21 DIAGNOSIS — K219 Gastro-esophageal reflux disease without esophagitis: Secondary | ICD-10-CM

## 2023-05-21 DIAGNOSIS — M25462 Effusion, left knee: Secondary | ICD-10-CM

## 2023-05-21 DIAGNOSIS — I10 Essential (primary) hypertension: Secondary | ICD-10-CM

## 2023-05-21 DIAGNOSIS — E0500 Thyrotoxicosis with diffuse goiter without thyrotoxic crisis or storm: Secondary | ICD-10-CM | POA: Diagnosis not present

## 2023-05-21 DIAGNOSIS — M25562 Pain in left knee: Secondary | ICD-10-CM | POA: Diagnosis not present

## 2023-05-21 DIAGNOSIS — G5600 Carpal tunnel syndrome, unspecified upper limb: Secondary | ICD-10-CM | POA: Insufficient documentation

## 2023-05-21 DIAGNOSIS — M1990 Unspecified osteoarthritis, unspecified site: Secondary | ICD-10-CM | POA: Insufficient documentation

## 2023-05-21 DIAGNOSIS — R911 Solitary pulmonary nodule: Secondary | ICD-10-CM | POA: Insufficient documentation

## 2023-05-21 MED ORDER — PREDNISONE 20 MG PO TABS
20.0000 mg | ORAL_TABLET | Freq: Every day | ORAL | 0 refills | Status: DC
Start: 2023-05-21 — End: 2023-09-10
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
## 2023-05-23 ENCOUNTER — Telehealth: Payer: Self-pay

## 2023-05-23 ENCOUNTER — Encounter: Payer: Self-pay | Admitting: "Endocrinology

## 2023-05-23 ENCOUNTER — Telehealth (INDEPENDENT_AMBULATORY_CARE_PROVIDER_SITE_OTHER): Payer: BC Managed Care – PPO | Admitting: "Endocrinology

## 2023-05-23 DIAGNOSIS — E010 Iodine-deficiency related diffuse (endemic) goiter: Secondary | ICD-10-CM | POA: Diagnosis not present

## 2023-05-23 DIAGNOSIS — E0500 Thyrotoxicosis with diffuse goiter without thyrotoxic crisis or storm: Secondary | ICD-10-CM | POA: Diagnosis not present

## 2023-05-23 DIAGNOSIS — F17200 Nicotine dependence, unspecified, uncomplicated: Secondary | ICD-10-CM

## 2023-05-23 NOTE — Telephone Encounter (Signed)
Sorry,I did not see patient.she was seen on 05/20/2022 by Dr.Veludandi.

## 2023-05-23 NOTE — Telephone Encounter (Signed)
Patient states she has seen you in office for some knee swelling and saw some fluid. She states she is not getting any better and wants to know where do you recommend her going for the fluid to get removed

## 2023-05-23 NOTE — Progress Notes (Signed)
The patient reports they are currently: [place]. I spent 6-7 minutes on the video with the patient on the date of service. I spent an additional 5 minutes on pre- and post-visit activities on the date of service.   The patient was physically located in [place] or a state in which I am permitted to provide care. The patient and/or parent/guardian understood that s/he may incur co-pays and cost sharing, and agreed to the telemedicine visit. The visit was reasonable and appropriate under the circumstances given the patient's presentation at the time.  The patient and/or parent/guardian has been advised of the potential risks and limitations of this mode of treatment (including, but not limited to, the absence of in-person examination) and has agreed to be treated using telemedicine. The patient's/patient's family's questions regarding telemedicine have been answered.   The patient and/or parent/guardian has also been advised to contact their provider's office for worsening conditions, and seek emergency medical treatment and/or call 911 if the patient deems either necessary.      Outpatient Endocrinology Note Anne Weskan, MD  05/23/23   Anne [name] [birth year] [id number]  Referring Provider: Gillis Santa* Primary Care Provider: Gillis Santa, NP Subjective  No chief complaint on file.   Assessment & Plan  Diagnoses and all orders for this visit:  Graves disease  Thyromegaly  Smoking    Anne [name] is currently on methimazole 10 mg twice a day. Stopped methimazole previously, she was told to be in remission. Restarted methimazole back in 2024. Patient currently clinically euthyroid.  Educated on thyroid axis.  Discussed the etiology for hyperthyroidism. Educated on thyroid axis.  Recommend the following: methimazole 10 mg twice a day. Labs today  Repeat labs sooner if symptoms of hyper or hypothyroidism develop.  Educated previously on  definitive options of treatment including RAI therapy and surgery. Patient does not want RAI at this time.  Counseled on: -complications of untreated hyperthyroidism including atrial fibrillation, heart failure and osteoporosis -side effects of Methimazole including but not limited to allergic reaction, rash, bone marrow suppression, liver dysfunction and teratogenic potential -implications in pregnancy and breastfeeding -compliance and follow up needs    Thyromegaly noted in 2018 thyroid ultrasound Patient c/o obstructive symptoms 11/27/22 thyroid ultrasound: reviewed images, thyromegaly, no nodules  The substances in cigarettes and possibly in vaping products can affect the autoimmune processes underlying Graves' disease The patient was counseled on the dangers of tobacco use, and was advised to quit.  Reviewed strategies to maximize success, including removing cigarettes and smoking materials from environment, stress management, support of family/friends, written materials and local smoking cessation programs. Encouraged to contact PCP for any help needed such as patches or pills to curb nicotine dependence . Patient understands and agreed   I have reviewed current medications, nurse's notes, allergies, vital signs, past medical and surgical history, family medical history, and social history for this encounter. Counseled patient on symptoms, examination findings, lab findings, imaging results, treatment decisions and monitoring and prognosis. The patient understood the recommendations and agrees with the treatment plan. All questions regarding treatment plan were fully answered.   Return in about 3 months (around 08/21/2023) for visit, labs today, labs before next visit.   Anne , MD  05/23/23   I have reviewed current medications, nurse's notes, allergies, vital signs, past medical and surgical history, family medical history, and social history for this encounter. Counseled patient  on symptoms, examination findings, lab findings, imaging results, treatment decisions and monitoring and prognosis.  The patient understood the recommendations and agrees with the treatment plan. All questions regarding treatment plan were fully answered.   History of Present Illness Anne [name] is a 43 y.o. year old female who presents to our clinic with graves disease.   TSH was undetectable in 2019 with elevated FT4 and FT3.    On  Symptoms suggestive of HYPERTHYROIDISM:  weight loss  No, weigh gain yes  Heat/cold intolerance Yes feels cold  Hyperdefecation/constipation  No constipation resolved  palpitations  Yes. Plans to see heart doctor in 06/2023  Compressive symptoms:  dysphagia  No dysphonia  Yes, raspy positional dyspnea (especially with simultaneous arms elevation)  No  Smokes  Yes On biotin  No Personal history of head/neck surgery/irradiation  No  Adverse Drug Effects from Methimazole (MMI): none, except low appetite   Grave's Ophthalmopathy Clinical Activity Score: 0/9, reported eye itching previously  __________  11/2022 THYROID ULTRASOUND   COMPARISON:  12/15/2016   FINDINGS: Parenchymal Echotexture: Moderately heterogenous   Isthmus: 0.6 cm   Right lobe: 6.6 x 3.4 x 2.8 cm   Left lobe: 6.7 x 2.9 x 2.8 cm   _________________________________________________________   Estimated total number of nodules >/= 1 cm: 0   Number of spongiform nodules >/=  2 cm not described below (TR1): 0   Number of mixed cystic and solid nodules >/= 1.5 cm not described below (TR2): 0   _________________________________________________________   No discrete nodules are seen within the thyroid gland.   IMPRESSION: Moderate diffuse heterogeneity of the thyroid without discrete nodule.   _______________  12/15/2016  THYROID ULTRASOUND   TECHNIQUE: Ultrasound examination of the thyroid gland and adjacent soft tissues was performed.   COMPARISON:  None.    FINDINGS: Parenchymal Echotexture: Mildly heterogenous   Isthmus: 0.7 cm thickness   Right lobe: 6.6 x 3.4 x 3.1 cm   Left lobe: 6.6 x 3.5 x 3 cm   _________________________________________________________   Estimated total number of nodules >/= 1 cm: 0   Number of spongiform nodules >/=  2 cm not described below (TR1): 0   Number of mixed cystic and solid nodules >/= 1.5 cm not described below (TR2): 0   _________________________________________________________   No discrete nodules identified.   IMPRESSION: Thyromegaly with mild heterogeneity, no nodule.  Physical Exam  There were no vitals taken for this visit. Constitutional: well developed, well nourished Head: normocephalic, atraumatic, no exophthalmos Eyes: sclera anicteric, no redness Neck: no thyromegaly, no thyroid tenderness; no nodules palpated Lungs: normal respiratory effort Neurology: alert and oriented, no fine hand tremor Skin: dry, no appreciable rashes Musculoskeletal: no appreciable defects Psychiatric: normal mood and affect  Allergies Allergies  Allergen Reactions   Food Allergy Formula Anaphylaxis and Swelling    coconut   Shellfish Allergy Swelling    Current Medications Patient's Medications  New Prescriptions   No medications on file  Previous Medications   HYDROCHLOROTHIAZIDE (HYDRODIURIL) 12.5 MG TABLET    Take 1 tablet (12.5 mg total) by mouth daily.   METHIMAZOLE (TAPAZOLE) 10 MG TABLET    Take 3 tablets (30 mg total) by mouth daily.   METOPROLOL TARTRATE (LOPRESSOR) 25 MG TABLET    Take 25 mg by mouth 2 (two) times daily.   PREDNISONE (DELTASONE) 20 MG TABLET    Take 1 tablet (20 mg total) by mouth daily with breakfast.  Modified Medications   No medications on file  Discontinued Medications   No medications on file    Past Medical  History Past Medical History:  Diagnosis Date   Abnormal echocardiogram    a. 11/2016 - increased LVOT gradient. see 01/09/2017 cardiology OV  in Epic   Asthma    rarely uses, only when sick   Chronic midline low back pain without sciatica    off and on   COVID-19 05/11/2022   given Paxlovid, pt stated she did not take due to being unable to swallow pills   GERD (gastroesophageal reflux disease)    Graves disease    Follows w/ Dr. Sharl Ma, endocronologist with [facility].   Heart murmur    12/14/16 echocardiogram in Epic   Hypertension    Follows w/ PCP, Monina Medina-Vargus, NP.   Mild mitral regurgitation    12/14/16 echo in Epic   Normocytic anemia    Obesity    Thyrotoxicosis 11/2014   Tobacco abuse    As of 2024, smoking hx of 20 years.   Tricuspid regurgitation    12/14/16 echo in Epic   Wears glasses     Past Surgical History Past Surgical History:  Procedure Laterality Date   ECTOPIC PREGNANCY SURGERY  2012   MANDIBLE SURGERY     around 2002   TUBAL LIGATION     one tube, ruptured during ectopic pregancy    Family History family history includes Cervical cancer in her mother; Diabetes in her maternal aunt, maternal aunt, and maternal grandmother; Heart Problems in her maternal aunt; Heart murmur in her paternal grandmother; Hypertension in her father; Kidney disease in her maternal aunt; Thyroid disease in her maternal grandmother and mother.  Social History Social History   Socioeconomic History   Marital status: Divorced    Spouse name: Not on file   Number of children: Not on file   Years of education: Not on file   Highest education level: GED or equivalent  Occupational History   Not on file  Tobacco Use   Smoking status: Every Day    Current packs/day: 1.00    Average packs/day: 1 pack/day for 20.0 years (20.0 ttl pk-yrs)    Types: Cigarettes   Smokeless tobacco: Never  Vaping Use   Vaping status: Former   Start date: 04/24/2015   Quit date: 04/23/2016  Substance and Sexual Activity   Alcohol use: Yes    Alcohol/week: 1.0 standard drink of alcohol    Types: 1 Glasses of wine per week     Comment: few beers and liquor on the weekend   Drug use: No   Sexual activity: Yes    Birth control/protection: None  Other Topics Concern   Not on file  Social History Narrative   Not on file   Social Drivers of Health   Financial Resource Strain: Low Risk  (05/20/2023)   Overall Financial Resource Strain (CARDIA)    Difficulty of Paying Living Expenses: Not very hard  Food Insecurity: Food Insecurity Present (05/20/2023)   Hunger Vital Sign    Worried About Running Out of Food in the Last Year: Sometimes true    Ran Out of Food in the Last Year: Sometimes true  Transportation Needs: No Transportation Needs (05/20/2023)   PRAPARE - Administrator, Civil Service (Medical): No    Lack of Transportation (Non-Medical): No  Physical Activity: Inactive (05/20/2023)   Exercise Vital Sign    Days of Exercise per Week: 0 days    Minutes of Exercise per Session: 70 min  Stress: No Stress Concern Present (05/20/2023)   [employer] of  Occupational Health - Occupational Stress Questionnaire    Feeling of Stress : Only a little  Social Connections: Moderately Isolated (05/20/2023)   Social Connection and Isolation Panel [NHANES]    Frequency of Communication with Friends and Family: More than three times a week    Frequency of Social Gatherings with Friends and Family: More than three times a week    Attends Religious Services: More than 4 times per year    Active Member of [employer] or Organizations: No    Attends Banker Meetings: Not on file    Marital Status: Divorced  Intimate Partner Violence: Not on file    Laboratory Investigations Lab Results  Component Value Date   TSH 6.71 (H) 03/11/2023   TSH <0.01 (L) 12/11/2022   TSH 0.00 Repeated and verified X2. (L) 10/17/2022   FREET4 0.31 (L) 03/11/2023   FREET4 0.79 12/11/2022   FREET4 1.95 (H) 10/17/2022     Lab Results  Component Value Date   TSI 307 (H) 10/17/2022     No components found for:  "TRAB"   Lab Results  Component Value Date   CHOL 93 05/28/2022   Lab Results  Component Value Date   HDL 36 (L) 05/28/2022   Lab Results  Component Value Date   LDLCALC 40 05/28/2022   Lab Results  Component Value Date   TRIG 90 05/28/2022   Lab Results  Component Value Date   CHOLHDL 2.6 05/28/2022   Lab Results  Component Value Date   CREATININE 0.66 08/09/2022   No results found for: "GFR"    Component Value Date/Time   NA 142 08/09/2022 0910   NA 140 06/14/2022 1613   K 4.0 08/09/2022 0910   CL 110 08/09/2022 0910   CO2 26 08/09/2022 0910   GLUCOSE 68 08/09/2022 0910   BUN 10 08/09/2022 0910   BUN 12 06/14/2022 1613   CREATININE 0.66 08/09/2022 0910   CALCIUM 9.2 08/09/2022 0910   PROT 7.0 05/11/2022 0843   ALBUMIN 3.5 05/11/2022 0843   AST 50 (H) 05/11/2022 0843   ALT 39 05/11/2022 0843   ALKPHOS 62 05/11/2022 0843   BILITOT 0.4 05/11/2022 0843   GFRNONAA >60 05/11/2022 0843   GFRNONAA 77 07/15/2014 1616   GFRAA >60 12/17/2016 0531   GFRAA 89 07/15/2014 1616      Latest Ref Rng & Units 08/09/2022    9:10 AM 06/14/2022    4:13 PM 06/11/2022   11:48 AM  BMP  Glucose 65 - 99 mg/dL 68  79  93   BUN 7 - 25 mg/dL 10  12  19    Creatinine 0.50 - 0.99 mg/dL 1.61  0.96  0.45   BUN/Creat Ratio 6 - 22 (calc) SEE NOTE:  19    Sodium 135 - 146 mmol/L 142  140  139   Potassium 3.5 - 5.3 mmol/L 4.0  4.2  5.8   Chloride 98 - 110 mmol/L 110  103  106   CO2 20 - 32 mmol/L 26  23    Calcium 8.6 - 10.2 mg/dL 9.2  9.4         Component Value Date/Time   WBC 6.7 06/11/2022 1138   RBC 4.50 06/11/2022 1138   HGB 13.6 06/11/2022 1148   HCT 40.0 06/11/2022 1148   PLT 276 06/11/2022 1138   MCV 88.0 06/11/2022 1138   MCV 96.2 07/08/2012 1708   MCH 28.9 06/11/2022 1138   MCHC 32.8 06/11/2022 1138   RDW  12.7 06/11/2022 1138   LYMPHSABS 1.4 05/11/2022 0843   MONOABS 0.5 05/11/2022 0843   EOSABS 0.1 05/11/2022 0843   BASOSABS 0.0 05/11/2022 0843      Parts of  this note may have been dictated using voice recognition software. There may be variances in spelling and vocabulary which are unintentional. Not all errors are proofread. Please notify the Thereasa Parkin if any discrepancies are noted or if the meaning of any statement is not clear.

## 2023-05-24 DIAGNOSIS — M25562 Pain in left knee: Secondary | ICD-10-CM | POA: Diagnosis not present

## 2023-05-24 DIAGNOSIS — M1712 Unilateral primary osteoarthritis, left knee: Secondary | ICD-10-CM | POA: Diagnosis not present

## 2023-05-24 DIAGNOSIS — M25462 Effusion, left knee: Secondary | ICD-10-CM | POA: Diagnosis not present

## 2023-05-24 NOTE — Telephone Encounter (Signed)
 Attempted to call patient. Anne [name]

## 2023-05-24 NOTE — Addendum Note (Signed)
Addended by: Venita Sheffield on: 05/24/2023 09:37 AM   Modules accepted: Orders

## 2023-07-15 ENCOUNTER — Ambulatory Visit: Payer: BC Managed Care – PPO | Attending: Cardiology | Admitting: Cardiology

## 2023-07-15 ENCOUNTER — Encounter: Payer: Self-pay | Admitting: Cardiology

## 2023-07-15 VITALS — BP 152/100 | HR 72 | Ht 70.0 in | Wt 285.2 lb

## 2023-07-15 DIAGNOSIS — I10 Essential (primary) hypertension: Secondary | ICD-10-CM | POA: Diagnosis not present

## 2023-07-15 DIAGNOSIS — F17200 Nicotine dependence, unspecified, uncomplicated: Secondary | ICD-10-CM | POA: Diagnosis not present

## 2023-07-15 DIAGNOSIS — Z13220 Encounter for screening for lipoid disorders: Secondary | ICD-10-CM

## 2023-07-15 DIAGNOSIS — R002 Palpitations: Secondary | ICD-10-CM | POA: Diagnosis not present

## 2023-07-15 DIAGNOSIS — Z79899 Other long term (current) drug therapy: Secondary | ICD-10-CM | POA: Diagnosis not present

## 2023-07-15 MED ORDER — AMLODIPINE BESYLATE 5 MG PO TABS: 5.0000 mg | ORAL_TABLET | Freq: Every day | ORAL | 3 refills | Status: DC

## 2023-07-15 MED ORDER — HYDROCHLOROTHIAZIDE 25 MG PO TABS
25.0000 mg | ORAL_TABLET | Freq: Every day | ORAL | 3 refills | Status: AC
Start: 2023-07-15 — End: 2023-12-30

## 2023-07-15 MED ORDER — BLOOD PRESSURE MONITOR AUTOMAT DEVI: 1.0000 [IU] | Freq: Once | 0 refills | Status: AC

## 2023-07-15 NOTE — Patient Instructions (Addendum)
 Medication Instructions:  Your physician has recommended you make the following change in your medication:  START: Amlodipine 5 mg once daily INCREASE: Hydrochlorothiazide 25 mg once daily   *If you need a refill on your cardiac medications before your next appointment, please call your pharmacy*   Lab Work: CMET, Mag, Lipids If you have labs (blood work) drawn today and your tests are completely normal, you will receive your results only by: MyChart Message (if you have MyChart) OR A paper copy in the mail If you have any lab test that is abnormal or we need to change your treatment, we will call you to review the results.   Follow-Up: At [facility], you and your health needs are our priority.  As part of our continuing mission to provide you with exceptional heart care, we have created designated Provider Care Teams.  These Care Teams include your primary Cardiologist (physician) and Advanced Practice Providers (APPs -  Physician Assistants and Nurse Practitioners) who all work together to provide you with the care you need, when you need it.  We recommend signing up for the patient portal called "MyChart".  Sign up information is provided on this After Visit Summary.  MyChart is used to connect with patients for Virtual Visits (Telemedicine).  Patients are able to view lab/test results, encounter notes, upcoming appointments, etc.  Non-urgent messages can be sent to your provider as well.   To learn more about what you can do with MyChart, go to [email or web address].    Your next appointment:   16 week(s)  Provider:   Thomasene Ripple, DO     Other Instructions Please see our pharmacy staff in 4-6 weeks.   Congratulations for your interest in quitting smoking!  Find a program that suits you best: when you want to quit, how you need support, where you live, and how you like to learn.    If you're ready to get started TODAY, consider scheduling a visit through [email or web address].  Appointments are available from 8am to 8pm, Monday to Friday.   Most health insurance plans will cover some level of tobacco cessation visits and medications.    Additional Resources: [employer] are also available to help you quit & provide the support you'll need. Many programs are available in both [place] and Spanish and have a long history of successfully helping people get off and stay off tobacco.    Quit Smoking Apps:  quitSTART at SeriousBroker.de QuitGuide?at ForgetParking.dk Online education and resources: Smokefree  at [employer].gov Free Telephone Coaching: QuitNow,  Call 1-800-QUIT-NOW (620-146-4671) or Text- Ready to 718-401-8969 *Quitline [place] has teamed up with Medicaid to offer a free 14 week program    Vaping- Want to Quit? Free 24/7 support. Call [facility]  [place], [place], [place], [place], [place]  [facility]

## 2023-07-15 NOTE — Progress Notes (Signed)
 Cardiology Office Note:    Date:  07/15/2023   ID:  Anne [name], DOB Apr 07, 1981, MRN [id number]  PCP:  Gillis Santa, NP  Cardiologist:  Thomasene Ripple, DO  Electrophysiologist:  None   Referring MD: Gillis Santa*   " I am having chest pain"  History of Present Illness:    Anne [name] is a 43 y.o. female with a hx of GERD, Graves' disease, hypertension, mitral regurgitation, smoker here today today for follow-up visit.  Her echocardiogram last year was concerning for LVOT gradient elevation suspected membrane.  Coronary CTA did not show any structural abnormalities.  She is here today for follow-up visit.  She reports that her blood pressure was 160 in January. She is currently on metoprolol, which was possibly started by the current provider. She has previously taken amlodipine, but was taken off it for unknown reasons. She denies leg swelling while on amlodipine. She also reports knee swelling, but it is unclear if this is related to her medication.  The patient also has thyroid disease, which causes her heart rate to increase. She is currently working and has recently been promoted, but this has led to a more sedentary lifestyle. She is a current smoker and expresses interest in quitting. She is also scheduled for a surgical procedure to remove a cyst on her ovaries, but this may be postponed due to her high blood pressure.  Past Medical History:  Diagnosis Date   Abnormal echocardiogram    a. 11/2016 - increased LVOT gradient. see 01/09/2017 cardiology OV in Epic   Asthma    rarely uses, only when sick   Chronic midline low back pain without sciatica    off and on   COVID-19 05/11/2022   given Paxlovid, pt stated she did not take due to being unable to swallow pills   GERD (gastroesophageal reflux disease)    Graves disease    Follows w/ Dr. Sharl Ma, endocronologist with [facility].   Heart murmur    12/14/16 echocardiogram in Epic    Hypertension    Follows w/ PCP, Monina Medina-Vargus, NP.   Mild mitral regurgitation    12/14/16 echo in Epic   Normocytic anemia    Obesity    Thyrotoxicosis 11/2014   Tobacco abuse    As of 2024, smoking hx of 20 years.   Tricuspid regurgitation    12/14/16 echo in Epic   Wears glasses     Past Surgical History:  Procedure Laterality Date   ECTOPIC PREGNANCY SURGERY  2012   MANDIBLE SURGERY     around 2002   TUBAL LIGATION     one tube, ruptured during ectopic pregancy    Current Medications: Current Meds  Medication Sig   amLODipine (NORVASC) 5 MG tablet Take 1 tablet (5 mg total) by mouth daily.   Blood Pressure Monitoring (BLOOD PRESSURE MONITOR AUTOMAT) DEVI 1 Units by Does not apply route once for 1 dose.   hydrochlorothiazide (HYDRODIURIL) 25 MG tablet Take 1 tablet (25 mg total) by mouth daily.   metoprolol tartrate (LOPRESSOR) 25 MG tablet Take 25 mg by mouth 2 (two) times daily.   predniSONE (DELTASONE) 20 MG tablet Take 1 tablet (20 mg total) by mouth daily with breakfast.   [DISCONTINUED] hydrochlorothiazide (HYDRODIURIL) 12.5 MG tablet Take 1 tablet (12.5 mg total) by mouth daily.     Allergies:   Food allergy formula and Shellfish allergy   Social History   Socioeconomic History   Marital status: Divorced  Spouse name: Not on file   Number of children: Not on file   Years of education: Not on file   Highest education level: GED or equivalent  Occupational History   Not on file  Tobacco Use   Smoking status: Every Day    Current packs/day: 1.00    Average packs/day: 1 pack/day for 20.0 years (20.0 ttl pk-yrs)    Types: Cigarettes   Smokeless tobacco: Never  Vaping Use   Vaping status: Former   Start date: 04/24/2015   Quit date: 04/23/2016  Substance and Sexual Activity   Alcohol use: Yes    Alcohol/week: 1.0 standard drink of alcohol    Types: 1 Glasses of wine per week    Comment: few beers and liquor on the weekend   Drug use: No   Sexual  activity: Yes    Birth control/protection: None  Other Topics Concern   Not on file  Social History Narrative   Not on file   Social Drivers of Health   Financial Resource Strain: Low Risk  (05/20/2023)   Overall Financial Resource Strain (CARDIA)    Difficulty of Paying Living Expenses: Not very hard  Food Insecurity: Food Insecurity Present (05/20/2023)   Hunger Vital Sign    Worried About Running Out of Food in the Last Year: Sometimes true    Ran Out of Food in the Last Year: Sometimes true  Transportation Needs: No Transportation Needs (05/20/2023)   PRAPARE - Administrator, Civil Service (Medical): No    Lack of Transportation (Non-Medical): No  Physical Activity: Inactive (05/20/2023)   Exercise Vital Sign    Days of Exercise per Week: 0 days    Minutes of Exercise per Session: 70 min  Stress: No Stress Concern Present (05/20/2023)   [employer] of Occupational Health - Occupational Stress Questionnaire    Feeling of Stress : Only a little  Social Connections: Moderately Isolated (05/20/2023)   Social Connection and Isolation Panel [NHANES]    Frequency of Communication with Friends and Family: More than three times a week    Frequency of Social Gatherings with Friends and Family: More than three times a week    Attends Religious Services: More than 4 times per year    Active Member of [employer] or Organizations: No    Attends Engineer, structural: Not on file    Marital Status: Divorced     Family History: The patient's family history includes Cervical cancer in her mother; Diabetes in her maternal aunt, maternal aunt, and maternal grandmother; Heart Problems in her maternal aunt; Heart murmur in her paternal grandmother; Hypertension in her father; Kidney disease in her maternal aunt; Thyroid disease in her maternal grandmother and mother.  ROS:   Review of Systems  Constitution: Negative for decreased appetite, fever and weight gain.  HENT:  Negative for congestion, ear discharge, hoarse voice and sore throat.   Eyes: Negative for discharge, redness, vision loss in right eye and visual halos.  Cardiovascular: Reports chest pain, dyspnea on exertion.negative for leg swelling, orthopnea and palpitations.  Respiratory: Negative for cough, hemoptysis, shortness of breath and snoring.   Endocrine: Negative for heat intolerance and polyphagia.  Hematologic/Lymphatic: Negative for bleeding problem. Does not bruise/bleed easily.  Skin: Negative for flushing, nail changes, rash and suspicious lesions.  Musculoskeletal: Negative for arthritis, joint pain, muscle cramps, myalgias, neck pain and stiffness.  Gastrointestinal: Negative for abdominal pain, bowel incontinence, diarrhea and excessive appetite.  Genitourinary: Negative for decreased  libido, genital sores and incomplete emptying.  Neurological: Negative for brief paralysis, focal weakness, headaches and loss of balance.  Psychiatric/Behavioral: Negative for altered mental status, depression and suicidal ideas.  Allergic/Immunologic: Negative for HIV exposure and persistent infections.    EKGs/Labs/Other Studies Reviewed:    The following studies were reviewed today:   EKG:  The ekg ordered today demonstrates   CCTA 06/25/2022 FINDINGS: Quality: Fair, attenuation artifact, HR 68   Coronary calcium score: The patient's coronary artery calcium score is 0, which places the patient in the 0 percentile.   Coronary arteries: Normal coronary origins.  Right dominance.   Right Coronary Artery: Dominant.  No disease.   Left Main Coronary Artery: Normal. Bifurcates into the LAD and LCx arteries.   Left Anterior Descending Coronary Artery: Anterior artery that reaches the apex, no disease. High diagonal branch which bifurcates, no disease.   Left Circumflex Artery: AV groove vessel without disease. OM1 branch without disease.   Aorta: Normal size, 32 mm at the mid ascending  aorta (level of the PA bifurcation) measured double oblique. No calcifications. No dissection.   Aortic Valve: Trileaflet. No calcifications.   Other findings:   Normal pulmonary vein drainage into the left atrium.   Normal left atrial appendage without a thrombus.   Normal size of the pulmonary artery.   IMPRESSION: 1. No evidence of CAD, CADRADS = 0.   2. Coronary calcium score of 0. This was 0 percentile for age and sex matched control.   3. Normal coronary origin with right dominance.   4. Consider non-coronary causes of chest pain.   Electronically Signed: By: Chrystie Nose M.D. On: 06/25/2022 13:32    TTE 06/15/2022 IMPRESSIONS     1. Very mild intracavitary gradient. Peak velocity 0.64 m/s. Peak  gradient 1.6 mmHg. Left ventricular ejection fraction, by estimation, is  65 to 70%. The left ventricle has normal function. The left ventricle has  no regional wall motion abnormalities.  There is moderate concentric left ventricular hypertrophy. Left  ventricular diastolic parameters are consistent with Grade I diastolic  dysfunction (impaired relaxation). The average left ventricular global  longitudinal strain is -17.3 %. The global  longitudinal strain is normal.   2. Right ventricular systolic function is normal. The right ventricular  size is normal. There is normal pulmonary artery systolic pressure.   3. Left atrial size was severely dilated.   4. The mitral valve is normal in structure. Trivial mitral valve  regurgitation. No evidence of mitral stenosis.   5. The gradient through the LVOT is elevated. However the aortic valve  leaflets are tricuspid and not stenotic. There is a hyperechoic density in  the LVOT. Suspect subvalvular membrane. Cardiac CTA is pending. The aortic  valve is tricuspid. Aortic valve   regurgitation is mild to moderate. No aortic stenosis is present. Aortic  regurgitation PHT measures 452 msec. Aortic valve area, by VTI measures   1.65 cm. Aortic valve mean gradient measures 22.0 mmHg. Aortic valve Vmax  measures 3.14 m/s.   6. The inferior vena cava is normal in size with greater than 50%  respiratory variability, suggesting right atrial pressure of 3 mmHg.   Comparison(s): EF 65%, question of subaortic membrane with LVOT gradient.   FINDINGS   Left Ventricle: Very mild intracavitary gradient. Peak velocity 0.64 m/s.  Peak gradient 1.6 mmHg. Left ventricular ejection fraction, by estimation,  is 65 to 70%. The left ventricle has normal function. The left ventricle  has no regional wall  motion  abnormalities. The average left ventricular global longitudinal strain is  -17.3 %. The global longitudinal strain is normal. The left ventricular  internal cavity size was normal in size. There is moderate concentric left  ventricular hypertrophy. Left  ventricular diastolic parameters are consistent with Grade I diastolic  dysfunction (impaired relaxation). Indeterminate filling pressures.   Right Ventricle: The right ventricular size is normal. No increase in  right ventricular wall thickness. Right ventricular systolic function is  normal. There is normal pulmonary artery systolic pressure. The tricuspid  regurgitant velocity is 2.72 m/s, and   with an assumed right atrial pressure of 3 mmHg, the estimated right  ventricular systolic pressure is 32.6 mmHg.   Left Atrium: Left atrial size was severely dilated.   Right Atrium: Right atrial size was normal in size.   Pericardium: There is no evidence of pericardial effusion.   Mitral Valve: The mitral valve is normal in structure. Trivial mitral  valve regurgitation. No evidence of mitral valve stenosis.   Tricuspid Valve: The tricuspid valve is normal in structure. Tricuspid  valve regurgitation is mild . No evidence of tricuspid stenosis.   Aortic Valve: The gradient through the LVOT is elevated. However the  aortic valve leaflets are tricuspid and not  stenotic. There is a  hyperechoic density in the LVOT. Suspect subvalvular membrane. Cardiac CTA  is pending. The aortic valve is tricuspid.  Aortic valve regurgitation is mild to moderate. Aortic regurgitation PHT  measures 452 msec. No aortic stenosis is present. Aortic valve mean  gradient measures 22.0 mmHg. Aortic valve peak gradient measures 39.4  mmHg. Aortic valve area, by VTI measures  1.65 cm.   Pulmonic Valve: The pulmonic valve was normal in structure. Pulmonic valve  regurgitation is trivial. No evidence of pulmonic stenosis.   Aorta: The aortic root is normal in size and structure.   Venous: The inferior vena cava is normal in size with greater than 50%  respiratory variability, suggesting right atrial pressure of 3 mmHg.   IAS/Shunts: No atrial level shunt detected by color flow Doppler    Recent Labs: 08/09/2022: BUN 10; Creat 0.66; Magnesium 2.1; Potassium 4.0; Sodium 142 03/11/2023: TSH 6.71  Recent Lipid Panel    Component Value Date/Time   CHOL 93 05/28/2022 1101   TRIG 90 05/28/2022 1101   HDL 36 (L) 05/28/2022 1101   CHOLHDL 2.6 05/28/2022 1101   LDLCALC 40 05/28/2022 1101    Physical Exam:    VS:  BP (!) 152/100   Pulse 72   Ht 5\' 10"  (1.778 m)   Wt 285 lb 3.2 oz (129.4 kg)   SpO2 98%   BMI 40.92 kg/m     Wt Readings from Last 3 Encounters:  07/15/23 285 lb 3.2 oz (129.4 kg)  05/21/23 285 lb 6.4 oz (129.5 kg)  03/13/23 279 lb (126.6 kg)     GEN: Well nourished, well developed in no acute distress HEENT: Normal NECK: No JVD; No carotid bruits LYMPHATICS: No lymphadenopathy CARDIAC: S1S2 noted,RRR, no murmurs, rubs, gallops RESPIRATORY:  Clear to auscultation without rales, wheezing or rhonchi  ABDOMEN: Soft, non-tender, non-distended, +bowel sounds, no guarding. EXTREMITIES: No edema, No cyanosis, no clubbing MUSCULOSKELETAL:  No deformity  SKIN: Warm and dry NEUROLOGIC:  Alert and oriented x 3, non-focal PSYCHIATRIC:  Normal affect,  good insight  ASSESSMENT:    1. Primary hypertension   2. Palpitations   3. Medication management   4. Current smoker   5. Screening for hyperlipidemia  6. Morbid obesity (HCC)     PLAN:    Hypertension Blood pressure remains elevated at 152/100 mmHg. Control is crucial for upcoming ovarian cyst surgery. - Add amlodipine 5 mg daily. - Increase hydrochlorothiazide to 25 mg daily. - Continue metoprolol. - Refer to pharmacist for follow-up in 4-6 weeks. - Order blood work for cholesterol and kidney function.  Atypical chest pain sounds more musculoskeletal Normal coronary CTA and echocardiogram last year.    Tobacco use disorder Current smoker interested in quitting. Discussed digital cessation program with virtual consultation. - Refer to digital smoking cessation program with a virtual provider.   The patient understands the need to lose weight with diet and exercise. We have discussed specific strategies for this.   The patient is in agreement with the above plan. The patient left the office in stable condition.  The patient will follow up in 4 weeks.   Medication Adjustments/Labs and Tests Ordered: Current medicines are reviewed at length with the patient today.  Concerns regarding medicines are outlined above.  Orders Placed This Encounter  Procedures   Comprehensive Metabolic Panel (CMET)   Magnesium   Lipid panel   Ambulatory referral to Virtual Care Smoking Cessation   AMB Referral to [facility] Pharm[facility]   EKG 12-Lead   Meds ordered this encounter  Medications   amLODipine (NORVASC) 5 MG tablet    Sig: Take 1 tablet (5 mg total) by mouth daily.    Dispense:  90 tablet    Refill:  3   hydrochlorothiazide (HYDRODIURIL) 25 MG tablet    Sig: Take 1 tablet (25 mg total) by mouth daily.    Dispense:  90 tablet    Refill:  3   Blood Pressure Monitoring (BLOOD PRESSURE MONITOR AUTOMAT) DEVI    Sig: 1 Units by Does not apply route once for 1 dose.    Dispense:   1 each    Refill:  0    Patient Instructions  Medication Instructions:  Your physician has recommended you make the following change in your medication:  START: Amlodipine 5 mg once daily INCREASE: Hydrochlorothiazide 25 mg once daily   *If you need a refill on your cardiac medications before your next appointment, please call your pharmacy*   Lab Work: CMET, Mag, Lipids If you have labs (blood work) drawn today and your tests are completely normal, you will receive your results only by: MyChart Message (if you have MyChart) OR A paper copy in the mail If you have any lab test that is abnormal or we need to change your treatment, we will call you to review the results.   Follow-Up: At [facility], you and your health needs are our priority.  As part of our continuing mission to provide you with exceptional heart care, we have created designated Provider Care Teams.  These Care Teams include your primary Cardiologist (physician) and Advanced Practice Providers (APPs -  Physician Assistants and Nurse Practitioners) who all work together to provide you with the care you need, when you need it.  We recommend signing up for the patient portal called "MyChart".  Sign up information is provided on this After Visit Summary.  MyChart is used to connect with patients for Virtual Visits (Telemedicine).  Patients are able to view lab/test results, encounter notes, upcoming appointments, etc.  Non-urgent messages can be sent to your provider as well.   To learn more about what you can do with MyChart, go to [email or web address].    Your next  appointment:   16 week(s)  Provider:   Thomasene Ripple, DO     Other Instructions Please see our pharmacy staff in 4-6 weeks.   Congratulations for your interest in quitting smoking!  Find a program that suits you best: when you want to quit, how you need support, where you live, and how you like to learn.    If you're ready to get started  TODAY, consider scheduling a visit through [facility] [email or web address].  Appointments are available from 8am to 8pm, Monday to Friday.   Most health insurance plans will cover some level of tobacco cessation visits and medications.    Additional Resources: [employer] are also available to help you quit & provide the support you'll need. Many programs are available in both [place] and Spanish and have a long history of successfully helping people get off and stay off tobacco.    Quit Smoking Apps:  quitSTART at SeriousBroker.de QuitGuide?at ForgetParking.dk Online education and resources: Smokefree  at [employer].gov Free Telephone Coaching: QuitNow,  Call 1-800-QUIT-NOW (519-764-1483) or Text- Ready to (539)881-3691 *Quitline [place] has teamed up with Medicaid to offer a free 14 week program    Vaping- Want to Quit? Free 24/7 support. Call [facility]  [place], [place], [place], [place], [place]  [place]     Adopting a Healthy Lifestyle.  Know what a healthy weight is for you (roughly BMI <25) and aim to maintain this   Aim for 7+ servings of fruits and vegetables daily   65-80+ fluid ounces of water or unsweet tea for healthy kidneys   Limit to max 1 drink of alcohol per day; avoid smoking/tobacco   Limit animal fats in diet for cholesterol and heart health - choose grass fed whenever available   Avoid highly processed foods, and foods high in saturated/trans fats   Aim for low stress - take time to unwind and care for your mental health   Aim for 150 min of moderate intensity exercise weekly for heart health, and weights twice weekly for bone health   Aim for 7-9 hours of sleep daily   When it comes to diets, agreement about the perfect plan isnt easy to find, even among the experts. Experts at the [facility] [employer] developed an idea known as the Healthy Eating Plate. Just imagine a plate  divided into logical, healthy portions.   The emphasis is on diet quality:   Load up on vegetables and fruits - one-half of your plate: Aim for color and variety, and remember that potatoes dont count.   Go for whole grains - one-quarter of your plate: Whole wheat, barley, wheat berries, quinoa, oats, brown rice, and foods made with them. If you want pasta, go with whole wheat pasta.   Protein power - one-quarter of your plate: Fish, chicken, beans, and nuts are all healthy, versatile protein sources. Limit red meat.   The diet, however, does go beyond the plate, offering a few other suggestions.   Use healthy plant oils, such as olive, canola, soy, corn, sunflower and peanut. Check the labels, and avoid partially hydrogenated oil, which have unhealthy trans fats.   If youre thirsty, drink water. Coffee and tea are good in moderation, but skip sugary drinks and limit milk and dairy products to one or two daily servings.   The type of carbohydrate in the diet is more important than the amount. Some sources of carbohydrates, such as vegetables, fruits, whole grains, and beans-are healthier than others.  Finally, stay active  Signed, Thomasene Ripple, DO  07/15/2023 11:13 AM    [facility]

## 2023-07-16 LAB — LIPID PANEL
Chol/HDL Ratio: 4.3 ratio (ref 0.0–4.4)
Cholesterol, Total: 165 mg/dL (ref 100–199)
HDL: 38 mg/dL — ABNORMAL LOW (ref >39)
LDL Chol Calc (NIH): 104 mg/dL — ABNORMAL HIGH (ref 0–99)
Triglycerides: 130 mg/dL (ref 0–149)
VLDL Cholesterol Cal: 23 mg/dL (ref 5–40)

## 2023-07-16 LAB — MAGNESIUM: Magnesium: 2 mg/dL (ref 1.6–2.3)

## 2023-07-16 LAB — COMPREHENSIVE METABOLIC PANEL
ALT: 6 IU/L (ref 0–32)
AST: 15 IU/L (ref 0–40)
Albumin: 4.4 g/dL (ref 3.9–4.9)
Alkaline Phosphatase: 98 IU/L (ref 44–121)
BUN/Creatinine Ratio: 13 (ref 9–23)
BUN: 12 mg/dL (ref 6–24)
Bilirubin Total: 0.9 mg/dL (ref 0.0–1.2)
CO2: 21 mmol/L (ref 20–29)
Calcium: 9.1 mg/dL (ref 8.7–10.2)
Chloride: 105 mmol/L (ref 96–106)
Creatinine, Ser: 0.9 mg/dL (ref 0.57–1.00)
Globulin, Total: 2.8 g/dL (ref 1.5–4.5)
Glucose: 79 mg/dL (ref 70–99)
Potassium: 4.6 mmol/L (ref 3.5–5.2)
Sodium: 139 mmol/L (ref 134–144)
Total Protein: 7.2 g/dL (ref 6.0–8.5)
eGFR: 81 mL/min/{1.73_m2} (ref >59)

## 2023-07-23 ENCOUNTER — Other Ambulatory Visit: Payer: Self-pay

## 2023-07-23 ENCOUNTER — Other Ambulatory Visit (HOSPITAL_COMMUNITY): Payer: Self-pay

## 2023-07-23 MED ORDER — BLOOD PRESSURE MONITOR AUTOMAT DEVI
1.0000 [IU] | Freq: Once | 0 refills | Status: AC
  Filled 2023-07-23: qty 1, 28d supply, fill #0
  Filled 2023-07-24: qty 1, 30d supply, fill #0

## 2023-07-23 NOTE — Progress Notes (Signed)
 Rx for blood pressure sent to [facility].

## 2023-07-24 ENCOUNTER — Other Ambulatory Visit (HOSPITAL_COMMUNITY): Payer: Self-pay

## 2023-07-25 ENCOUNTER — Encounter: Payer: Self-pay | Admitting: Cardiology

## 2023-08-01 ENCOUNTER — Other Ambulatory Visit (HOSPITAL_COMMUNITY): Payer: Self-pay

## 2023-08-16 ENCOUNTER — Other Ambulatory Visit: Payer: Self-pay

## 2023-08-16 ENCOUNTER — Emergency Department (HOSPITAL_BASED_OUTPATIENT_CLINIC_OR_DEPARTMENT_OTHER)
Admission: EM | Admit: 2023-08-16 | Discharge: 2023-08-16 | Disposition: A | Discharge status: Home / Self Care | Attending: Emergency Medicine | Admitting: Emergency Medicine

## 2023-08-16 ENCOUNTER — Emergency Department (HOSPITAL_BASED_OUTPATIENT_CLINIC_OR_DEPARTMENT_OTHER): Admitting: Radiology

## 2023-08-16 ENCOUNTER — Ambulatory Visit: Payer: Self-pay

## 2023-08-16 ENCOUNTER — Encounter (HOSPITAL_BASED_OUTPATIENT_CLINIC_OR_DEPARTMENT_OTHER): Payer: Self-pay

## 2023-08-16 DIAGNOSIS — F17200 Nicotine dependence, unspecified, uncomplicated: Secondary | ICD-10-CM | POA: Diagnosis not present

## 2023-08-16 DIAGNOSIS — H9201 Otalgia, right ear: Secondary | ICD-10-CM | POA: Diagnosis not present

## 2023-08-16 DIAGNOSIS — R079 Chest pain, unspecified: Secondary | ICD-10-CM

## 2023-08-16 DIAGNOSIS — R0600 Dyspnea, unspecified: Secondary | ICD-10-CM | POA: Insufficient documentation

## 2023-08-16 DIAGNOSIS — Z79899 Other long term (current) drug therapy: Secondary | ICD-10-CM | POA: Diagnosis not present

## 2023-08-16 DIAGNOSIS — R072 Precordial pain: Secondary | ICD-10-CM | POA: Diagnosis not present

## 2023-08-16 DIAGNOSIS — R0602 Shortness of breath: Secondary | ICD-10-CM | POA: Diagnosis not present

## 2023-08-16 DIAGNOSIS — R0789 Other chest pain: Secondary | ICD-10-CM | POA: Diagnosis not present

## 2023-08-16 LAB — BASIC METABOLIC PANEL WITH GFR
Anion gap: 12 (ref 5–15)
BUN: 15 mg/dL (ref 6–20)
CO2: 23 mmol/L (ref 22–32)
Calcium: 9.2 mg/dL (ref 8.9–10.3)
Chloride: 103 mmol/L (ref 98–111)
Creatinine, Ser: 1.07 mg/dL — ABNORMAL HIGH (ref 0.44–1.00)
GFR, Estimated: >60 mL/min (ref >60)
Glucose, Bld: 95 mg/dL (ref 70–99)
Potassium: 3.9 mmol/L (ref 3.5–5.1)
Sodium: 137 mmol/L (ref 135–145)

## 2023-08-16 LAB — CBC
HCT: 42.9 % (ref 36.0–46.0)
Hemoglobin: 14.5 g/dL (ref 12.0–15.0)
MCH: 31.5 pg (ref 26.0–34.0)
MCHC: 33.8 g/dL (ref 30.0–36.0)
MCV: 93.3 fL (ref 80.0–100.0)
Platelets: 197 10*3/uL (ref 150–400)
RBC: 4.6 MIL/uL (ref 3.87–5.11)
RDW: 12.9 % (ref 11.5–15.5)
WBC: 9 10*3/uL (ref 4.0–10.5)
nRBC: 0 % (ref 0.0–0.2)

## 2023-08-16 LAB — PRO BRAIN NATRIURETIC PEPTIDE: Pro Brain Natriuretic Peptide: 52.7 pg/mL (ref <300.0)

## 2023-08-16 LAB — RESP PANEL BY RT-PCR (RSV, FLU A&B, COVID)  RVPGX2
Influenza A by PCR: NEGATIVE
Influenza B by PCR: NEGATIVE
Resp Syncytial Virus by PCR: NEGATIVE
SARS Coronavirus 2 by RT PCR: NEGATIVE

## 2023-08-16 LAB — TROPONIN T, HIGH SENSITIVITY
Troponin T High Sensitivity: <15 ng/L (ref <19)
Troponin T High Sensitivity: <15 ng/L (ref <19)

## 2023-08-16 LAB — HCG, QUANTITATIVE, PREGNANCY: hCG, Beta Chain, Quant, S: <1 m[IU]/mL (ref <5)

## 2023-08-16 MED ORDER — IPRATROPIUM-ALBUTEROL 0.5-2.5 (3) MG/3ML IN SOLN
3.0000 mL | Freq: Once | RESPIRATORY_TRACT | Status: AC
  Administered 2023-08-16: 3 mL via RESPIRATORY_TRACT
  Filled 2023-08-16: qty 3

## 2023-08-16 MED ORDER — ALBUTEROL SULFATE HFA 108 (90 BASE) MCG/ACT IN AERS
2.0000 | INHALATION_SPRAY | Freq: Four times a day (QID) | RESPIRATORY_TRACT | Status: DC | PRN
  Administered 2023-08-16: 2 via RESPIRATORY_TRACT
  Filled 2023-08-16: qty 6.7

## 2023-08-16 NOTE — ED Notes (Signed)
 EDP at [facility]

## 2023-08-16 NOTE — ED Notes (Signed)
 RT Note: Patient requested an Albuterol  inhaler refill for home. She tolerated the breathing treatment well and stated it really help the tight chest feeling. SPO2 97% RA

## 2023-08-16 NOTE — Telephone Encounter (Signed)
 Noted.

## 2023-08-16 NOTE — Telephone Encounter (Signed)
  Chief Complaint: chest pain  Symptoms: SOB Frequency: constant for 3 days   Disposition: [x] ED /[] Urgent Care (no appt availability in office) / [] Appointment(In office/virtual)/ []  [place] [facility]/ [] Home Care/ [] Refused Recommended Disposition /[] [place] [facility]/ []  Follow-up with PCP Additional Notes: Pt calling with complaints of chest pain and SOB for 3 days. Pt states chest pain is in center. Pain 5/10.  Pt notices the SOB while walking. Pt has a right earache that started last night that travels down into neck. Pt has hx of heart murmur. Pt saw Cardiologist 3 weeks ago and was placed on Amlodipine . "She said my heart looked good." RN advised pt to go to ED and to call back for follow-up appt next week with PCP. Pt verbalized understanding and stated she will go to ED.             Copied from CRM [id number]. Topic: Clinical - Red Word Triage >> Aug 16, 2023 11:42 AM Jayson Michael wrote: [facility] that prompted transfer to Nurse Triage:  Earache & Chest Pain Patient reports an earache that began last night. In addition, she is experiencing intermittent chest pain that started approximately 3 days ago. She also reports shortness of breath with exertion, especially when using stairs.  Patient denies fever, but describes the chest pain as severe, noting it kept her up all night. Patient is requesting an acute visit Reason for Disposition  [1] Chest pain (or "angina") comes and goes AND [2] is happening more often (increasing in frequency) or getting worse (increasing in severity)  (Exception: Chest pains that last only a few seconds.)  Answer Assessment - Initial Assessment Questions 1. LOCATION: "Where does it hurt?"       middle 2. RADIATION: "Does the pain go anywhere else?" (e.g., into neck, jaw, arms, back)     Denies  3. ONSET: "When did the chest pain begin?" (Minutes, hours or days)      3 days  4. PATTERN: "Does the pain come and go, or has it been constant since  it started?"  "Does it get worse with exertion?"      Constant  5. DURATION: "How long does it last" (e.g., seconds, minutes, hours)     3 days 6. SEVERITY: "How bad is the pain?"  (e.g., Scale 1-10; mild, moderate, or severe)    - MILD (1-3): doesn't interfere with normal activities     - MODERATE (4-7): interferes with normal activities or awakens from sleep    - SEVERE (8-10): excruciating pain, unable to do any normal activities       5 7. CARDIAC RISK FACTORS: "Do you have any history of heart problems or risk factors for heart disease?" (e.g., angina, prior heart attack; diabetes, high blood pressure, high cholesterol, smoker, or strong family history of heart disease)     High blood pressure, heart murmur  8. PULMONARY RISK FACTORS: "Do you have any history of lung disease?"  (e.g., blood clots in lung, asthma, emphysema, birth control pills)     Asthma  9. CAUSE: "What do you think is causing the chest pain?"     Not sure  10. OTHER SYMPTOMS: "Do you have any other symptoms?" (e.g., dizziness, nausea, vomiting, sweating, fever, difficulty breathing, cough)       Earache;  Protocols used: Chest Pain-A-AH

## 2023-08-16 NOTE — ED Triage Notes (Signed)
 Patient arrives ambulatory to the ED with complaints of shortness of breath (hx of asthma), chest pain, and worsening right ear pain (10/10 right ear pain).

## 2023-08-16 NOTE — ED Notes (Signed)
 Pt alert, NAD, calm, resps e/u, interactive, speaking in clear complete sentences, skin W&D. C/o CP, sob, nausea, R neck and ear pain, HA. Denies fever, syncope, VD.

## 2023-08-16 NOTE — Telephone Encounter (Signed)
 See Triage Notes from Triage Nurse   Message sent to [name], Monina C, NP

## 2023-08-16 NOTE — Telephone Encounter (Signed)
 Agree that she needs to go to ED for her chest pain.

## 2023-08-16 NOTE — ED Provider Notes (Signed)
 [facility] AT [facility] Provider Note   CSN: [id number] Arrival date & time: 08/16/23  1245     History  Chief Complaint  Patient presents with   Shortness of Breath   Ear Pain    Right    Chest Pain    Anne [name] is a 43 y.o. female.  She has a history of heart murmur, thyroid  disease.  Complaining of of sharp stabbing chest pain for the last 3 days intermittent.  Associated with some shortness of breath dyspnea on exertion.  She also noticed some significant right ear pain since last night.  Radiates down into her neck.  No headache blurry vision double vision numbness or weakness.  No known trauma.  No fevers or chills.  Saw cardiologist last month, history of mitral regurg.  She had a coronary CTA that did not show any structural abnormalities.  Last cardiac echo 2/24 showed EF of 65 to 70%.  Trivial mitral regurg.  Aortic valve regurg mild to moderate.  The history is provided by the patient.  Chest Pain Pain location:  Substernal area Pain quality: sharp and stabbing   Pain radiates to:  Does not radiate Pain severity:  Moderate Onset quality:  Gradual Duration:  3 days Timing:  Intermittent Progression:  Unchanged Chronicity:  New Relieved by:  None tried Worsened by:  Nothing Ineffective treatments:  None tried Associated symptoms: shortness of breath   Associated symptoms: no abdominal pain, no cough, no fever, no nausea and no vomiting   Risk factors: smoking        Home Medications Prior to Admission medications   Medication Sig Start Date End Date Taking? Authorizing Provider  amLODipine  (NORVASC ) 5 MG tablet Take 1 tablet (5 mg total) by mouth daily. 07/15/23 10/13/23  Tobb, Kardie, DO  hydrochlorothiazide  (HYDRODIURIL ) 25 MG tablet Take 1 tablet (25 mg total) by mouth daily. 07/15/23 10/13/23  Tobb, Kardie, DO  methimazole  (TAPAZOLE ) 10 MG tablet Take 3 tablets (30 mg total) by mouth daily. 11/05/22 05/21/23  Motwani, Komal,  MD  metoprolol  tartrate (LOPRESSOR ) 25 MG tablet Take 25 mg by mouth 2 (two) times daily.    [provider]  predniSONE  (DELTASONE ) 20 MG tablet Take 1 tablet (20 mg total) by mouth daily with breakfast. 05/21/23   Tye Gall, MD      Allergies    Food allergy formula and Shellfish allergy    Review of Systems   Review of Systems  Constitutional:  Negative for fever.  HENT:  Positive for ear pain. Negative for sore throat.   Respiratory:  Positive for shortness of breath. Negative for cough.   Cardiovascular:  Positive for chest pain.  Gastrointestinal:  Negative for abdominal pain, nausea and vomiting.  Genitourinary:  Negative for dysuria.  Skin:  Negative for rash.    Physical Exam Updated Vital Signs BP (!) 131/92   Pulse 80   Temp 98.2 F (36.8 C)   Resp 16   Ht 5\' 10"  (1.778 m)   Wt 129.3 kg   SpO2 100%   BMI 40.89 kg/m  Physical Exam Vitals and nursing note reviewed.  Constitutional:      General: She is not in acute distress.    Appearance: Normal appearance. She is well-developed.  HENT:     Head: Normocephalic and atraumatic.     Right Ear: Tympanic membrane, ear canal and external ear normal.     Left Ear: Tympanic membrane, ear canal and external ear  normal.     Nose: Nose normal.     Mouth/Throat:     Mouth: Mucous membranes are moist.     Pharynx: Oropharynx is clear.  Eyes:     Conjunctiva/sclera: Conjunctivae normal.  Cardiovascular:     Rate and Rhythm: Normal rate and regular rhythm.     Heart sounds: No murmur heard. Pulmonary:     Effort: Pulmonary effort is normal. No respiratory distress.     Breath sounds: Normal breath sounds. No stridor. No wheezing.  Abdominal:     Palpations: Abdomen is soft.     Tenderness: There is no abdominal tenderness. There is no guarding or rebound.  Musculoskeletal:        General: No tenderness or deformity. Normal range of motion.     Cervical back: Neck supple.  Skin:    General:  Skin is warm and dry.     Capillary Refill: Capillary refill takes less than 2 seconds.  Neurological:     General: No focal deficit present.     Mental Status: She is alert.     GCS: GCS eye subscore is 4. GCS verbal subscore is 5. GCS motor subscore is 6.     Motor: No weakness.     ED Results / Procedures / Treatments   Labs (all labs ordered are listed, but only abnormal results are displayed) Labs Reviewed  BASIC METABOLIC PANEL WITH GFR - Abnormal; Notable for the following components:      Result Value   Creatinine, Ser 1.07 (*)    All other components within normal limits  RESP PANEL BY RT-PCR (RSV, FLU A&B, COVID)  RVPGX2  CBC  HCG, QUANTITATIVE, PREGNANCY  PRO BRAIN NATRIURETIC PEPTIDE  TROPONIN T, HIGH SENSITIVITY  TROPONIN T, HIGH SENSITIVITY    EKG EKG Interpretation Date/Time:  Friday August 16 2023 17:37:35 EDT Ventricular Rate:  67 PR Interval:  119 QRS Duration:  94 QT Interval:  451 QTC Calculation: 477 R Axis:   57  Text Interpretation: Sinus rhythm Borderline short PR interval Abnormal R-wave progression, early transition Consider left ventricular hypertrophy repeat ecg looks better, more like her baseline 3/25 Confirmed by Racheal Buddle [phone]) on 08/16/2023 5:40:05 PM  Radiology DG Chest 2 View Result Date: 08/16/2023 CLINICAL DATA:  Chest pain. EXAM: CHEST - 2 VIEW COMPARISON:  05/11/2022. FINDINGS: The heart size and mediastinal contours are within normal limits. Both lungs are clear. No pleural effusion or pneumothorax. No acute osseous abnormality. IMPRESSION: No acute cardiopulmonary findings. Electronically Signed   By: Mannie Seek M.D.   On: 08/16/2023 14:42    Procedures Procedures    Medications Ordered in ED Medications  ipratropium-albuterol  (DUONEB) 0.5-2.5 (3) MG/3ML nebulizer solution 3 mL (3 mLs Nebulization Given 08/16/23 1550)    ED Course/ Medical Decision Making/ A&P Clinical Course as of 08/17/23 1103  Fri Aug 16, 2023  1716 Patient's lab work troponin and BNP are all unremarkable.  Saturations are 100% on room air not tachypneic.  PERC negative. [MB]  1744 Reviewed results of workup with patient.  I repeated her EKG because she had new T wave inversions and they are all back to her baseline.  Recommended close follow-up with her cardiologist and her PCP.  Return instructions discussed. [MB]    Clinical Course User Index [MB] Tonya Fredrickson, MD  Medical Decision Making Amount and/or Complexity of Data Reviewed Labs: ordered. Radiology: ordered.  Risk Prescription drug management.   This patient complains of right earache, shortness of breath chest pain; this involves an extensive number of treatment Options and is a complaint that carries with it a high risk of complications and morbidity. The differential includes asthma, allergies, COPD, pneumonia, otitis, vascular, ACS  I ordered, reviewed and interpreted labs, which included CBC normal chemistries unremarkable troponins flat BNP normal COVID flu negative I ordered medication breathing treatment and reviewed PMP when indicated. I ordered imaging studies which included chest x-ray and I independently    visualized and interpreted imaging which showed no acute findings Previous records obtained and reviewed in epic including recent cardiology notes Cardiac monitoring reviewed, sinus rhythm Social determinants considered, tobacco use, food and housing insecurity Critical Interventions: None  After the interventions stated above, I reevaluated the patient and found patient to be resting comfortably hemodynamically stable Admission and further testing considered, no indications for admission or further workup at this time.  Recommended close follow-up with PCP and cardiology.  Return instructions discussed         Final Clinical Impression(s) / ED Diagnoses Final diagnoses:  Earache on right   Nonspecific chest pain    Rx / DC Orders ED Discharge Orders     None         Tonya Fredrickson, MD 08/17/23 1105

## 2023-08-16 NOTE — ED Notes (Signed)
 RT Note: Patient appears to be in no distress at this time. She does have HX Asthma. Does not feel like she needs a breathing treatment at this time. SPO2 100% on room air.

## 2023-08-16 NOTE — ED Notes (Signed)
 RT Note: Patient has HX Asthma and is requesting a breathing treatment due to a tight chest feeling

## 2023-08-16 NOTE — Discharge Instructions (Signed)
 You were seen in the emergency department for pain in your ear and neck along with some chest pain.  You had blood work EKG chest x-ray COVID and flu testing that did not show an obvious explanation for your symptoms.  Please continue current medications and follow-up with your PCP and cardiologist.  Return if any worsening or concerning symptoms

## 2023-08-16 NOTE — ED Notes (Signed)
Up to b/r, steady gait 

## 2023-09-10 ENCOUNTER — Other Ambulatory Visit (HOSPITAL_COMMUNITY): Payer: Self-pay

## 2023-09-10 ENCOUNTER — Ambulatory Visit: Attending: Cardiovascular Disease | Admitting: Pharmacist

## 2023-09-10 ENCOUNTER — Encounter: Payer: Self-pay | Admitting: Pharmacist

## 2023-09-10 VITALS — BP 147/102 | HR 103

## 2023-09-10 DIAGNOSIS — I10 Essential (primary) hypertension: Secondary | ICD-10-CM | POA: Diagnosis not present

## 2023-09-10 DIAGNOSIS — F17200 Nicotine dependence, unspecified, uncomplicated: Secondary | ICD-10-CM

## 2023-09-10 MED ORDER — NICOTINE POLACRILEX 4 MG MT GUM
4.0000 mg | CHEWING_GUM | OROMUCOSAL | 0 refills | Status: AC | PRN
  Filled 2023-09-10: qty 100, 30d supply, fill #0

## 2023-09-10 MED ORDER — VALSARTAN 80 MG PO TABS
80.0000 mg | ORAL_TABLET | Freq: Every day | ORAL | 2 refills | Status: AC
  Filled 2023-09-10: qty 30, 30d supply, fill #0

## 2023-09-10 NOTE — Patient Instructions (Addendum)
 It was nice meeting you today  We would like your blood pressure to be less than 130/80  Please continue: Hydrochlorothiazide  25mg  once a day Metoprolol  25mg  twice a day  We will start a new medication today called valsartan 80mg  once a day  I would like to check your lab work in about 1-2 weeks after starting it  Continue to check your blood pressure at home and bring in your readings next month  If you would like help with smoking cessation let me know  Joelene Murrain, PharmD, BCACP, CDCES, CPP [address], [address] [place], [place], [address] Phone: [phone], Fax: [phone]

## 2023-09-10 NOTE — Progress Notes (Signed)
 Patient ID: Anne [name]                 DOB: 1981-02-01                      MRN: [id number]     HPI: Anne [name] is a 43 y.o. female referred by Dr. Emmette Harms to HTN clinic. PMH is significant for HTN, Graves disease, and smoking.  Patient presents today to discuss HTN management. Has ovarian cyst which can not be removed until BP is better controlled.  At last visit with Dr Emmette Harms, hydrochlorothiazide  was increased to 25mg  daily and amlodipine  5mg  was started. Patient self discontinued amlodipine  due to lower leg swelling.  Continues to smoke 1/2 pack per day. Has nicotine  patches at home but has not started yet. Referred to counselor but has not scheduled yet.  Continues to report chest pain with occasional SOB with exertion. Recent lab results were normal. Currently has no LEE.   Has been checking her BP at home but did not bring cuff or log. Can not remember any home readings.   Followed by endo for Graves Disease. Reports sluggishness and weight gain. Has thyroid  tests ordered but she is not sure when they need to be drawn. Currently on methimazole  30mg  daily. Hands are shaking in room which she believes is due to anxiety.  Current HTN meds:  Hydrochlorothiazide  25mg  daily Metoprolol  25mg  BID  Previously tried:  Amlodipine  (swelling)  BP goal: <130/80   Wt Readings from Last 3 Encounters:  08/16/23 285 lb (129.3 kg)  07/15/23 285 lb 3.2 oz (129.4 kg)  05/21/23 285 lb 6.4 oz (129.5 kg)   BP Readings from Last 3 Encounters:  09/10/23 (!) 147/102  08/16/23 129/83  07/15/23 (!) 152/100   Pulse Readings from Last 3 Encounters:  09/10/23 (!) 103  08/16/23 75  07/15/23 72    Renal function: CrCl cannot be calculated (Patient's most recent lab result is older than the maximum 21 days allowed.).  Past Medical History:  Diagnosis Date   Abnormal echocardiogram    a. 11/2016 - increased LVOT gradient. see 01/09/2017 cardiology OV in Epic   Asthma    rarely uses, only  when sick   Chronic midline low back pain without sciatica    off and on   COVID-19 05/11/2022   given Paxlovid , pt stated she did not take due to being unable to swallow pills   GERD (gastroesophageal reflux disease)    Graves disease    Follows w/ Dr. Kathyanne Parkers, endocronologist with [facility].   Heart murmur    12/14/16 echocardiogram in Epic   Hypertension    Follows w/ PCP, Monina Medina-Vargus, NP.   Mild mitral regurgitation    12/14/16 echo in Epic   Normocytic anemia    Obesity    Thyrotoxicosis 11/2014   Tobacco abuse    As of 2024, smoking hx of 20 years.   Tricuspid regurgitation    12/14/16 echo in Epic   Wears glasses     Current Outpatient Medications on File Prior to Visit  Medication Sig Dispense Refill   hydrochlorothiazide  (HYDRODIURIL ) 25 MG tablet Take 1 tablet (25 mg total) by mouth daily. 90 tablet 3   methimazole  (TAPAZOLE ) 10 MG tablet Take 3 tablets (30 mg total) by mouth daily. 270 tablet 1   metoprolol  tartrate (LOPRESSOR ) 25 MG tablet Take 25 mg by mouth 2 (two) times daily.     predniSONE  (DELTASONE ) 20 MG  tablet Take 1 tablet (20 mg total) by mouth daily with breakfast. 10 tablet 0   No current facility-administered medications on file prior to visit.    Allergies  Allergen Reactions   Food Allergy Formula Anaphylaxis and Swelling    coconut   Shellfish Allergy Swelling   Amlodipine  Swelling     Assessment/Plan:  1. Hypertension -  HYPERTENSION CONTROL Vitals:   09/10/23 1015 09/10/23 1018  BP: (!) 142/92 (!) 147/102    The patient's blood pressure is elevated above target today.  In order to address the patient's elevated BP: A new medication was prescribed today.; A referral to the [facility] will be placed.    Patient BP in room elevated at 142/92 which is above goal of <130/80. Likely smoking and anxiety are playing roles. Recommended patient begin using her nicotine  patches and gave counseling how to use. Also  advised she could supplement with gum or lozenges which she is also interested in. Counseled on correct way to use nicotine  gum.  Since patient intolerant to amlodipine , will need a different BP lowering agent. Will start valsartan 80mg  once daily and check BMP in 1-2 weeks. Recheck in clinic in 4 weeks.  Continue: Hydrochlorothiazide  25mg  daily Metoprolol  25mg  BID Start: Valsartan 80mg  daily Check BMP in 1-2 weeks F/u in clinic in 4 weeks  Joelene Murrain, PharmD, BCACP, CDCES, CPP [address], [address] [place], [place], [address] Phone: [phone], Fax: [phone]

## 2023-09-11 ENCOUNTER — Other Ambulatory Visit (HOSPITAL_COMMUNITY): Payer: Self-pay

## 2023-10-11 ENCOUNTER — Ambulatory Visit: Payer: Self-pay

## 2023-10-11 NOTE — Telephone Encounter (Signed)
 FYI Only or Action Required?: FYI only for provider.  Patient was last seen in primary care on 05/21/2023 by Tye Gall, MD.  Called Nurse Triage reporting Joint Swelling.   Symptoms began 09/25/23.   Interventions attempted: Prescription medications: Naproxen  500 mg and Other: compression stockings.   Symptoms are: unchanged.  Triage Disposition: See PCP When Office is Open (Within 3 Days)  Patient/caregiver understands and will follow disposition?: Yes

## 2023-10-11 NOTE — Telephone Encounter (Signed)
 FYI Only or Action Required?: FYI only for provider.  Patient was last seen in primary care on 05/21/2023 by Tye Gall, MD.  Called Nurse Triage reporting Joint Swelling.   Symptoms began 09/25/23.   Interventions attempted: Prescription medications: Naproxen  500 mg and Other: compression stockings.   Symptoms are: unchanged.  Triage Disposition: See PCP When Office is Open (Within 3 Days)  Patient/caregiver understands and will follow disposition?: Yes  Patient has an appointment to be seen in office with Tye Gall, MD  See Triage Notes from Triage Nurse  Message sent to [name], Monina C, NP

## 2023-10-11 NOTE — Telephone Encounter (Signed)
 Copied from CRM [id number]. Topic: Clinical - Red Word Triage >> Oct 11, 2023 11:24 AM Shelby Dessert H wrote: [facility] that prompted transfer to Nurse Triage: Patient went out of town and since coming back both ankles are swollen, the left one has gone down some but the right one is really swollen her toes, and foot, pain level 1-10, pain is a 10 its now hard for the patient to walk Reason for Disposition . MILD or MODERATE ankle swelling (e.g., can't move joint normally, can't do usual activities) (Exceptions: Itchy, localized swelling; swelling is chronic.)  Answer Assessment - Initial Assessment Questions Patient says she's been having swelling since she went on vacation riding a plane 4.5 hours going and coming, she noticed swelling to both of her ankles/feet. She says while on vacation the swelling did not go down while taking the hydrochlorothiazide  and using compression socks. She says she came back home on 6/8. When she woke up on 6/9, no swelling to the right ankle/foot, but the left was still swollen. She says it's painful and she took someone's Naproxen  500 mg for the pain, knowing she shouldn't take someone else's prescription medication. She stated she's been SOB before the trip, but notices it's a little worse since coming back and with the swelling only when moving around or talking at work yesterday. Advised OV first available with any provider since no availability with PCP for 2 weeks, she agreed, scheduled for Tuesday.  1. LOCATION: Which ankle is swollen? Where is the swelling?     Right ankle/foot at this time 2. ONSET: When did the swelling start?     09/25/23 after plain ride 3. SWELLING: How bad is the swelling? Or, How large is it? (e.g., mild, moderate, severe; size of localized swelling)    - NONE: No joint swelling.   - LOCALIZED: Localized; small area of puffy or swollen skin (e.g., insect bite, skin irritation).   - MILD: Joint looks or feels mildly swollen or puffy.    - MODERATE: Swollen; interferes with normal activities (e.g., work or school); decreased range of movement; may be limping.   - SEVERE: Very swollen; can't move swollen joint at all; limping a lot or unable to walk.     Moderate 4. PAIN: Is there any pain? If Yes, ask: How bad is it? (Scale 1-10; or mild, moderate, severe)   - NONE (0): no pain.   - MILD (1-3): doesn't interfere with normal activities.    - MODERATE (4-7): interferes with normal activities (e.g., work or school) or awakens from sleep, limping.    - SEVERE (8-10): excruciating pain, unable to do any normal activities, unable to walk.      10 5. CAUSE: What do you think caused the ankle swelling?     Traveling possibly, heart doctor changed BP meds 6. OTHER SYMPTOMS: Do you have any other symptoms? (e.g., fever, chest pain, difficulty breathing, calf pain)     SOB with activity since before 6/4 and is worse since the swelling, calf pain  Protocols used: Ankle Swelling-A-AH

## 2023-10-12 NOTE — Telephone Encounter (Signed)
 Noted

## 2023-10-15 ENCOUNTER — Ambulatory Visit: Admitting: Sports Medicine

## 2023-10-16 NOTE — Progress Notes (Signed)
 This encounter was created in error - please disregard.

## 2023-10-18 ENCOUNTER — Ambulatory Visit: Attending: Pharmacist | Admitting: Pharmacist

## 2023-10-18 NOTE — Progress Notes (Deleted)
 Patient ID: Anne [name]                 DOB: 1981-02-01                      MRN: [id number]     HPI: Anne [name] is a 43 y.o. female referred by Dr. Emmette Harms to HTN clinic. PMH is significant for HTN, Graves disease, and smoking.  Patient presents today to discuss HTN management. Has ovarian cyst which can not be removed until BP is better controlled.  At last visit with Dr Emmette Harms, hydrochlorothiazide  was increased to 25mg  daily and amlodipine  5mg  was started. Patient self discontinued amlodipine  due to lower leg swelling.  Continues to smoke 1/2 pack per day. Has nicotine  patches at home but has not started yet. Referred to counselor but has not scheduled yet.  Continues to report chest pain with occasional SOB with exertion. Recent lab results were normal. Currently has no LEE.   Has been checking her BP at home but did not bring cuff or log. Can not remember any home readings.   Followed by endo for Graves Disease. Reports sluggishness and weight gain. Has thyroid  tests ordered but she is not sure when they need to be drawn. Currently on methimazole  30mg  daily. Hands are shaking in room which she believes is due to anxiety.  Current HTN meds:  Hydrochlorothiazide  25mg  daily Metoprolol  25mg  BID  Previously tried:  Amlodipine  (swelling)  BP goal: <130/80   Wt Readings from Last 3 Encounters:  08/16/23 285 lb (129.3 kg)  07/15/23 285 lb 3.2 oz (129.4 kg)  05/21/23 285 lb 6.4 oz (129.5 kg)   BP Readings from Last 3 Encounters:  09/10/23 (!) 147/102  08/16/23 129/83  07/15/23 (!) 152/100   Pulse Readings from Last 3 Encounters:  09/10/23 (!) 103  08/16/23 75  07/15/23 72    Renal function: CrCl cannot be calculated (Patient's most recent lab result is older than the maximum 21 days allowed.).  Past Medical History:  Diagnosis Date   Abnormal echocardiogram    a. 11/2016 - increased LVOT gradient. see 01/09/2017 cardiology OV in Epic   Asthma    rarely uses, only  when sick   Chronic midline low back pain without sciatica    off and on   COVID-19 05/11/2022   given Paxlovid , pt stated she did not take due to being unable to swallow pills   GERD (gastroesophageal reflux disease)    Graves disease    Follows w/ Dr. Kathyanne Parkers, endocronologist with [facility].   Heart murmur    12/14/16 echocardiogram in Epic   Hypertension    Follows w/ PCP, Monina Medina-Vargus, NP.   Mild mitral regurgitation    12/14/16 echo in Epic   Normocytic anemia    Obesity    Thyrotoxicosis 11/2014   Tobacco abuse    As of 2024, smoking hx of 20 years.   Tricuspid regurgitation    12/14/16 echo in Epic   Wears glasses     Current Outpatient Medications on File Prior to Visit  Medication Sig Dispense Refill   hydrochlorothiazide  (HYDRODIURIL ) 25 MG tablet Take 1 tablet (25 mg total) by mouth daily. 90 tablet 3   methimazole  (TAPAZOLE ) 10 MG tablet Take 3 tablets (30 mg total) by mouth daily. 270 tablet 1   metoprolol  tartrate (LOPRESSOR ) 25 MG tablet Take 25 mg by mouth 2 (two) times daily.     predniSONE  (DELTASONE ) 20 MG  tablet Take 1 tablet (20 mg total) by mouth daily with breakfast. 10 tablet 0   No current facility-administered medications on file prior to visit.    Allergies  Allergen Reactions   Food Allergy Formula Anaphylaxis and Swelling    coconut   Shellfish Allergy Swelling   Amlodipine  Swelling     Assessment/Plan:  1. Hypertension -  HYPERTENSION CONTROL Vitals:   09/10/23 1015 09/10/23 1018  BP: (!) 142/92 (!) 147/102    The patient's blood pressure is elevated above target today.  In order to address the patient's elevated BP: A new medication was prescribed today.; A referral to the [facility] will be placed.    Patient BP in room elevated at 142/92 which is above goal of <130/80. Likely smoking and anxiety are playing roles. Recommended patient begin using her nicotine  patches and gave counseling how to use. Also  advised she could supplement with gum or lozenges which she is also interested in. Counseled on correct way to use nicotine  gum.  Since patient intolerant to amlodipine , will need a different BP lowering agent. Will start valsartan 80mg  once daily and check BMP in 1-2 weeks. Recheck in clinic in 4 weeks.  Continue: Hydrochlorothiazide  25mg  daily Metoprolol  25mg  BID Start: Valsartan 80mg  daily Check BMP in 1-2 weeks F/u in clinic in 4 weeks  Joelene Murrain, PharmD, BCACP, CDCES, CPP [address], [address] [place], [place], [address] Phone: [phone], Fax: [phone]

## 2023-11-01 ENCOUNTER — Ambulatory Visit: Attending: Nurse Practitioner | Admitting: Nurse Practitioner

## 2023-11-01 NOTE — Progress Notes (Deleted)
 Office Visit    Patient Name: Anne [name] Date of Encounter: 11/01/2023  Primary Care Provider:  [name], Monina C, NP Primary Cardiologist:  Anne Tobb, DO  Chief Complaint    43 year old female with a history of chest pain (coronary calcium score of 0), mitral valve regurgitation, aortic valve regurgitation, hypertension, Graves' disease, GERD, and tobacco use who presents for follow-up related to hypertension.  Past Medical History    Past Medical History:  Diagnosis Date   Abnormal echocardiogram    a. 11/2016 - increased LVOT gradient. see 01/09/2017 cardiology OV in Epic   Asthma    rarely uses, only when sick   Chronic midline low back pain without sciatica    off and on   COVID-19 05/11/2022   given Paxlovid , pt stated she did not take due to being unable to swallow pills   GERD (gastroesophageal reflux disease)    Graves disease    Follows w/ Anne [name], endocronologist with Anne [name].   Heart murmur    12/14/16 echocardiogram in Epic   Hypertension    Follows w/ PCP, Monina Medina-Vargus, NP.   Mild mitral regurgitation    12/14/16 echo in Epic   Normocytic anemia    Obesity    Thyrotoxicosis 11/2014   Tobacco abuse    As of 2024, smoking hx of 20 years.   Tricuspid regurgitation    12/14/16 echo in Epic   Wears glasses    Past Surgical History:  Procedure Laterality Date   ECTOPIC PREGNANCY SURGERY  2012   MANDIBLE SURGERY     around 2002   TUBAL LIGATION     one tube, ruptured during ectopic pregancy    Allergies  Allergies  Allergen Reactions   Food Allergy Formula Anaphylaxis and Swelling    coconut   Shellfish Allergy Swelling   Amlodipine  Swelling     Labs/Other Studies Reviewed    The following studies were reviewed today:  Cardiac Studies & Procedures   ______________________________________________________________________________________________     ECHOCARDIOGRAM  ECHOCARDIOGRAM COMPLETE  06/15/2022  Narrative ECHOCARDIOGRAM REPORT    Patient Name:   Anne [name] Date of Exam: 06/15/2022 Medical Rec #:  [id number]        Height:       71.0 in Accession #:    [id number]       Weight:       242.0 lb Date of Birth:  17-Jun-1980       BSA:          2.286 m Patient Age:    41 years         BP:           135/80 mmHg Patient Gender: F                HR:           77 bpm. Exam Location:  Outpatient  Procedure: 2D Echo, 3D Echo, Cardiac Doppler, Color Doppler and Strain Analysis  Indications:    R06.9 DOE; R07.89 Other chest pain; R60.0 Lower extremity edema  History:        Patient has prior history of Echocardiogram examinations, most recent 12/14/2016. Signs/Symptoms:Chest Pain, Dyspnea and Edema; Risk Factors:Current Smoker and Hypertension. Patient is having left chest discomfort, DOE and leg edema.  Sonographer:    Annabella Cater RVT, RDCS (AE), RDMS Referring Phys: [id number] Anne [name]  IMPRESSIONS   1. Very mild intracavitary gradient. Peak velocity 0.64 m/s. Peak gradient 1.6 mmHg. Left  ventricular ejection fraction, by estimation, is 65 to 70%. The left ventricle has normal function. The left ventricle has no regional wall motion abnormalities. There is moderate concentric left ventricular hypertrophy. Left ventricular diastolic parameters are consistent with Grade I diastolic dysfunction (impaired relaxation). The average left ventricular global longitudinal strain is -17.3 %. The global longitudinal strain is normal. 2. Right ventricular systolic function is normal. The right ventricular size is normal. There is normal pulmonary artery systolic pressure. 3. Left atrial size was severely dilated. 4. The mitral valve is normal in structure. Trivial mitral valve regurgitation. No evidence of mitral stenosis. 5. The gradient through the LVOT is elevated. However the aortic valve leaflets are tricuspid and not stenotic. There is a hyperechoic density in the LVOT.  Suspect subvalvular membrane. Cardiac CTA is pending. The aortic valve is tricuspid. Aortic valve regurgitation is mild to moderate. No aortic stenosis is present. Aortic regurgitation PHT measures 452 msec. Aortic valve area, by VTI measures 1.65 cm. Aortic valve mean gradient measures 22.0 mmHg. Aortic valve Vmax measures 3.14 m/s. 6. The inferior vena cava is normal in size with greater than 50% respiratory variability, suggesting right atrial pressure of 3 mmHg.  Comparison(s): EF 65%, question of subaortic membrane with LVOT gradient.  FINDINGS Left Ventricle: Very mild intracavitary gradient. Peak velocity 0.64 m/s. Peak gradient 1.6 mmHg. Left ventricular ejection fraction, by estimation, is 65 to 70%. The left ventricle has normal function. The left ventricle has no regional wall motion abnormalities. The average left ventricular global longitudinal strain is -17.3 %. The global longitudinal strain is normal. The left ventricular internal cavity size was normal in size. There is moderate concentric left ventricular hypertrophy. Left ventricular diastolic parameters are consistent with Grade I diastolic dysfunction (impaired relaxation). Indeterminate filling pressures.  Right Ventricle: The right ventricular size is normal. No increase in right ventricular wall thickness. Right ventricular systolic function is normal. There is normal pulmonary artery systolic pressure. The tricuspid regurgitant velocity is 2.72 m/s, and with an assumed right atrial pressure of 3 mmHg, the estimated right ventricular systolic pressure is 32.6 mmHg.  Left Atrium: Left atrial size was severely dilated.  Right Atrium: Right atrial size was normal in size.  Pericardium: There is no evidence of pericardial effusion.  Mitral Valve: The mitral valve is normal in structure. Trivial mitral valve regurgitation. No evidence of mitral valve stenosis.  Tricuspid Valve: The tricuspid valve is normal in structure.  Tricuspid valve regurgitation is mild . No evidence of tricuspid stenosis.  Aortic Valve: The gradient through the LVOT is elevated. However the aortic valve leaflets are tricuspid and not stenotic. There is a hyperechoic density in the LVOT. Suspect subvalvular membrane. Cardiac CTA is pending. The aortic valve is tricuspid. Aortic valve regurgitation is mild to moderate. Aortic regurgitation PHT measures 452 msec. No aortic stenosis is present. Aortic valve mean gradient measures 22.0 mmHg. Aortic valve peak gradient measures 39.4 mmHg. Aortic valve area, by VTI measures 1.65 cm.  Pulmonic Valve: The pulmonic valve was normal in structure. Pulmonic valve regurgitation is trivial. No evidence of pulmonic stenosis.  Aorta: The aortic root is normal in size and structure.  Venous: The inferior vena cava is normal in size with greater than 50% respiratory variability, suggesting right atrial pressure of 3 mmHg.  IAS/Shunts: No atrial level shunt detected by color flow Doppler.   LEFT VENTRICLE PLAX 2D LVIDd:         4.24 cm     Diastology LVIDs:  2.64 cm     LV e' medial:    9.03 cm/s LV PW:         1.43 cm     LV E/e' medial:  11.4 LV IVS:        1.12 cm     LV e' lateral:   13.90 cm/s LVOT diam:     2.00 cm     LV E/e' lateral: 7.4 LV SV:         111 LV SV Index:   49          2D Longitudinal Strain LVOT Area:     3.14 cm    2D Strain GLS (A2C):   -15.2 % 2D Strain GLS (A3C):   -16.8 % 2D Strain GLS (A4C):   -19.2 % LV Volumes (MOD)           2D Strain GLS Avg:     -17.3 % LV vol d, MOD A2C: 73.4 ml LV vol d, MOD A4C: 86.4 ml LV vol s, MOD A2C: 25.4 ml LV vol s, MOD A4C: 33.6 ml 3D Volume EF: LV SV MOD A2C:     48.0 ml 3D EF:        61 % LV SV MOD A4C:     86.4 ml LV EDV:       156 ml LV SV MOD BP:      50.1 ml LV ESV:       61 ml LV SV:        95 ml  RIGHT VENTRICLE RV S prime:     17.00 cm/s TAPSE (M-mode): 3.2 cm  LEFT ATRIUM              Index        RIGHT ATRIUM            Index LA diam:        5.20 cm  2.27 cm/m   RA Area:     16.90 cm LA Vol (A2C):   113.0 ml 49.42 ml/m  RA Volume:   43.20 ml  18.89 ml/m LA Vol (A4C):   104.0 ml 45.48 ml/m LA Biplane Vol: 111.0 ml 48.55 ml/m AORTIC VALVE                     PULMONIC VALVE AV Area (Vmax):    1.55 cm      PV Vmax:       1.80 m/s AV Area (Vmean):   1.77 cm      PV Peak grad:  13.0 mmHg AV Area (VTI):     1.65 cm AV Vmax:           314.00 cm/s AV Vmean:          216.000 cm/s AV VTI:            0.674 m AV Peak Grad:      39.4 mmHg AV Mean Grad:      22.0 mmHg LVOT Vmax:         155.00 cm/s LVOT Vmean:        122.000 cm/s LVOT VTI:          0.354 m LVOT/AV VTI ratio: 0.53 AI PHT:            452 msec AR Vena Contracta: 0.58 cm  AORTA Ao Root diam: 2.60 cm Ao Asc diam:  3.40 cm Ao Arch diam: 3.4 cm  MITRAL VALVE  TRICUSPID VALVE MV Area (PHT): 3.36 cm     TR Peak grad:   29.6 mmHg MV Decel Time: 226 msec     TR Vmax:        272.00 cm/s MV E velocity: 103.00 cm/s MV A velocity: 111.00 cm/s  SHUNTS MV E/A ratio:  0.93         Systemic VTI:  0.35 m Systemic Diam: 2.00 cm  Annabella Scarce MD Electronically signed by Annabella Scarce MD Signature Date/Time: 06/15/2022/2:03:18 PM    Final    MONITORS  LONG TERM MONITOR (3-14 DAYS) 08/06/2022  Narrative Patch Wear Time:  13 days and 14 hours (2024-03-24T13:46:20-0400 to 2024-04-07T04:39:57-0400)  Patient had a min HR of 59 bpm, max HR of 163 bpm, and avg HR of 99 bpm. Predominant underlying rhythm was Sinus Rhythm. Isolated SVEs were rare (<1.0%), and no SVE Couplets or SVE Triplets were present. Isolated VEs were rare (<1.0%), and no VE Couplets or VE Triplets were present.  Symptoms associated with sinus rhythm.  Conclusion: Normal/unremarkable study.   CT SCANS  CT CORONARY MORPH W/CTA COR W/SCORE 06/25/2022  Addendum 06/26/2022 11:49 AM ADDENDUM REPORT: 06/26/2022 11:46  EXAM: OVER-READ INTERPRETATION   CT CHEST  The following report is an over-read performed by radiologist Dr. Andrea Lurie [facility] on 06/26/2022. This over-read does not include interpretation of cardiac or coronary anatomy or pathology. The coronary CTA interpretation by the cardiologist is attached.  COMPARISON:  Chest radiograph 05/11/2022 chest CT 04/11/2018  FINDINGS: Vascular: No aortic atherosclerosis. The included aorta is normal in caliber.  Mediastinum/nodes: No adenopathy. Unremarkable esophagus.  Lungs: No focal airspace disease. Stable 3 mm subpleural right middle lobe nodule, series 11, image 13. This is unchanged dating back to 2019 and considered benign, needing no further follow-up. No pleural fluid. The included airways are patent.  Upper abdomen: No acute or unexpected findings.  Musculoskeletal: There are no acute or suspicious osseous abnormalities. Minor thoracic spurring.  IMPRESSION: 1. Stable subpleural right middle lobe pulmonary nodule dating back to 2019, considered benign needing no further follow-up. 2. No acute extracardiac findings.   Electronically Signed By: Andrea Gasman M.D. On: 06/26/2022 11:46  Narrative HISTORY: 43 yo female with chest pain, nonspecific Dyspnea on exertion (DOE)  EXAM: Cardiac/Coronary CTA  TECHNIQUE: The patient was scanned on a [employer].  PROTOCOL: A 90 kV prospective scan was triggered in the descending thoracic aorta at 111 HU's. Axial non-contrast 3 mm slices were carried out through the heart. The data set was analyzed on a dedicated work station and scored using the Agatson method. Gantry rotation speed was 250 msecs and collimation was .6 mm. Beta blockade and 0.8 mg of sl NTG was given. The 3D data set was reconstructed in 5% intervals of the 35-75 % of the R-R cycle. Diastolic phases were analyzed on a dedicated work station using MPR, MIP and VRT modes. The patient received 100mL OMNIPAQUE   IOHEXOL  350 MG/ML SOLN of contrast.  FINDINGS: Quality: Fair, attenuation artifact, HR 68  Coronary calcium score: The patient's coronary artery calcium score is 0, which places the patient in the 0 percentile.  Coronary arteries: Normal coronary origins.  Right dominance.  Right Coronary Artery: Dominant.  No disease.  Left Main Coronary Artery: Normal. Bifurcates into the LAD and LCx arteries.  Left Anterior Descending Coronary Artery: Anterior artery that reaches the apex, no disease. High diagonal branch which bifurcates, no disease.  Left Circumflex Artery: AV groove vessel without disease.  OM1 branch without disease.  Aorta: Normal size, 32 mm at the mid ascending aorta (level of the PA bifurcation) measured double oblique. No calcifications. No dissection.  Aortic Valve: Trileaflet. No calcifications.  Other findings:  Normal pulmonary vein drainage into the left atrium.  Normal left atrial appendage without a thrombus.  Normal size of the pulmonary artery.  IMPRESSION: 1. No evidence of CAD, CADRADS = 0.  2. Coronary calcium score of 0. This was 0 percentile for age and sex matched control.  3. Normal coronary origin with right dominance.  4. Consider non-coronary causes of chest pain.  Electronically Signed: By: Vinie JAYSON Maxcy M.D. On: 06/25/2022 13:32     ______________________________________________________________________________________________     Recent Labs: 03/11/2023: TSH 6.71 07/15/2023: ALT 6; Magnesium 2.0 08/16/2023: BUN 15; Creatinine, Ser 1.07; Hemoglobin 14.5; Platelets 197; Potassium 3.9; Pro Brain Natriuretic Peptide 52.7; Sodium 137  Recent Lipid Panel    Component Value Date/Time   CHOL 165 07/15/2023 1112   TRIG 130 07/15/2023 1112   HDL 38 (L) 07/15/2023 1112   CHOLHDL 4.3 07/15/2023 1112   CHOLHDL 2.6 05/28/2022 1101   LDLCALC 104 (H) 07/15/2023 1112   LDLCALC 40 05/28/2022 1101    History of Present Illness     43 year old female with the above past medical history including chest pain (coronary calcium score of 0), mitral valve regurgitation, aortic valve regurgitation, hypertension, Graves' disease, GERD, and tobacco use.  Echocardiogram in 05/2022 showed EF 65 to 70%, normal LV function, no RWMA, moderate concentric LVH, G1 DD, normal RV systolic function, mild to moderate aortic valve regurgitation, possible subaortic membrane with LVOT gradient.  Coronary CT angiogram in 06/2022 revealed coronary calcium score of 0, stable right middle lobe pulmonary nodule dating back to 2019.  Cardiac monitor in 07/2022 showed predominantly normal sinus rhythm, rare PACs and PVCs, no significant arrhythmia.  She was last seen by hypertension clinic Pharm.D. on 09/10/2023.  BP was elevated.  She was started on valsartan  80 mg daily.  It was noted that anxiety and tobacco use were likely contributing to her ongoing elevated BP.   She presents today for follow-up.  Since her last visit  Hypertension: Atypical chest pain: Valvular heart disease: Graves' disease: Tobacco use: Disposition:  Home Medications    Current Outpatient Medications  Medication Sig Dispense Refill   hydrochlorothiazide  (HYDRODIURIL ) 25 MG tablet Take 1 tablet (25 mg total) by mouth daily. 90 tablet 3   methimazole  (TAPAZOLE ) 10 MG tablet Take 3 tablets (30 mg total) by mouth daily. 270 tablet 1   metoprolol  tartrate (LOPRESSOR ) 25 MG tablet Take 25 mg by mouth 2 (two) times daily.     nicotine  polacrilex (NICORETTE ) 4 MG gum Chew 1 piece (4 mg total) of gum as needed for smoking cessation as directed on package. 100 tablet 0   valsartan  (DIOVAN ) 80 MG tablet Take 1 tablet (80 mg total) by mouth daily. 30 tablet 2   No current facility-administered medications for this visit.     Review of Systems    ***.  All other systems reviewed and are otherwise negative except as noted above.    Physical Exam    VS:  There were no vitals taken  for this visit. , BMI There is no height or weight on file to calculate BMI.     GEN: Well nourished, well developed, in no acute distress. HEENT: normal. Neck: Supple, no JVD, carotid bruits, or masses. Cardiac: RRR, no murmurs, rubs, or gallops. No  clubbing, cyanosis, edema.  Radials/DP/PT 2+ and equal bilaterally.  Respiratory:  Respirations regular and unlabored, clear to auscultation bilaterally. GI: Soft, nontender, nondistended, BS + x 4. MS: no deformity or atrophy. Skin: warm and dry, no rash. Neuro:  Strength and sensation are intact. Psych: Normal affect.  Accessory Clinical Findings    ECG personally reviewed by me today -    - no acute changes.   Lab Results  Component Value Date   WBC 9.0 08/16/2023   HGB 14.5 08/16/2023   HCT 42.9 08/16/2023   MCV 93.3 08/16/2023   PLT 197 08/16/2023   Lab Results  Component Value Date   CREATININE 1.07 (H) 08/16/2023   BUN 15 08/16/2023   NA 137 08/16/2023   K 3.9 08/16/2023   CL 103 08/16/2023   CO2 23 08/16/2023   Lab Results  Component Value Date   ALT 6 07/15/2023   AST 15 07/15/2023   ALKPHOS 98 07/15/2023   BILITOT 0.9 07/15/2023   Lab Results  Component Value Date   CHOL 165 07/15/2023   HDL 38 (L) 07/15/2023   LDLCALC 104 (H) 07/15/2023   TRIG 130 07/15/2023   CHOLHDL 4.3 07/15/2023    Lab Results  Component Value Date   HGBA1C 5.4 05/28/2022    Assessment & Plan    1.  ***  No BP recorded.  {Refresh Note OR Click here to enter BP  :1}***   Damien JAYSON Braver, NP 11/01/2023, 6:33 AM

## 2023-11-25 ENCOUNTER — Ambulatory Visit (INDEPENDENT_AMBULATORY_CARE_PROVIDER_SITE_OTHER): Admitting: Orthopedic Surgery

## 2023-11-25 ENCOUNTER — Ambulatory Visit: Payer: Self-pay

## 2023-11-25 VITALS — BP 140/92 | HR 103 | Temp 97.7°F | Resp 16 | Ht 70.0 in | Wt 283.0 lb

## 2023-11-25 DIAGNOSIS — I10 Essential (primary) hypertension: Secondary | ICD-10-CM | POA: Diagnosis not present

## 2023-11-25 DIAGNOSIS — R21 Rash and other nonspecific skin eruption: Secondary | ICD-10-CM

## 2023-11-25 DIAGNOSIS — B354 Tinea corporis: Secondary | ICD-10-CM | POA: Diagnosis not present

## 2023-11-25 MED ORDER — TERBINAFINE HCL 250 MG PO TABS: 250.0000 mg | ORAL_TABLET | Freq: Every day | ORAL | 0 refills | Status: AC

## 2023-11-25 MED ORDER — TRIAMCINOLONE ACETONIDE 0.1 % EX CREA: 1.0000 | TOPICAL_CREAM | Freq: Two times a day (BID) | CUTANEOUS | 0 refills | Status: DC

## 2023-11-25 NOTE — Telephone Encounter (Signed)
 FYI Only or Action Required?: FYI only for provider.  Patient was last seen in primary care on 05/21/2023 by Anne Madden, MD.  Called Nurse Triage reporting Rash.  Symptoms began yesterday.  Interventions attempted:  alcohol.  Symptoms are: unchanged.  Triage Disposition: See HCP Within 4 Hours (Or PCP Triage)  Patient/caregiver understands and will follow disposition?: Yes      Copied from CRM #[id number]. Topic: Clinical - Red Word Triage >> Nov 25, 2023 11:08 AM Merlynn LABOR wrote: Red Word that prompted transfer to Nurse Triage: Allergic reaction on arms and now all over body and back. Reason for Disposition  [1] Headache AND [2] no fever  Answer Assessment - Initial Assessment Questions 1. APPEARANCE of RASH: What does the rash look like? (e.g., blisters, dry flaky skin, red spots, redness, sores)     Red bumps  2. SIZE: How big are the spots? (e.g., tip of pen, eraser, coin; inches, centimeters)    Plum size 3. LOCATION: Where is the rash located?     Arms and back  and buttocks 4. COLOR: What color is the rash? (Note: It is difficult to assess rash color in people with darker-colored skin. When this situation occurs, simply ask the caller to describe what they see.)     red 5. ONSET: When did the rash begin?     yesterday 6. FEVER: Do you have a fever? If Yes, ask: What is your temperature, how was it measured, and when did it start?     no 7. ITCHING: Does the rash itch? If Yes, ask: How bad is the itch? (Scale 1-10; or mild, moderate, severe)     Comes and goes  8. CAUSE: What do you think is causing the rash?     unknown 9. MEDICINE FACTORS: Have you started any new medicines within the last 2 weeks? (e.g., antibiotics)      no 10. OTHER SYMPTOMS: Do you have any other symptoms? (e.g., dizziness, headache, sore throat, joint pain)       Sluggish, nausea, and headache on left side of head  Protocols used: Rash or Redness -  [facility]

## 2023-11-25 NOTE — Patient Instructions (Signed)
 Will prescribe terbinafine  pills for ringworm> complete all of it  Triamcinolone  cream for rash to side   Make follow up with Cardiology about blood pressure

## 2023-11-25 NOTE — Telephone Encounter (Signed)
 Appointment scheduled for 11/25/2023.

## 2023-11-25 NOTE — Progress Notes (Signed)
 Careteam: Patient Care Team: Anne [name] BROCKS, NP as PCP - General (Internal Medicine) Tobb, Kardie, DO as PCP - Cardiology (Cardiology)  Seen by: Anne [name], AGNP-C  PLACE OF SERVICE:  [facility]  Advanced Directive information    Allergies  Allergen Reactions   Food Allergy Formula Anaphylaxis and Swelling    coconut   Shellfish Allergy Swelling   Amlodipine  Swelling    Chief Complaint  Patient presents with   Rash    Patient complains of rash, and bumps on body.      HPI: Patient is a 43 y.o. female seen today for acute visit due to rash.    Discussed the use of AI scribe software for clinical note transcription with the patient, who gave verbal consent to proceed.  History of Present Illness    She noticed the rash on her right arm yesterday while out eating. It is described as itchy and has spread to her back and side. There have been no recent changes in soaps, lotions, shampoos, or detergents. She denies any known insect bites, although she was outside on Saturday night and speculates about possible mosquito bites. Her boyfriend's sister also has red marks on her leg and foot, which appear as little bite marks, but her rash is described as 'huge'.  She denies any recent yard work or exposure to animals that could have caused the rash, although she has a dog that she keeps clean.   Initial blood pressure 154/110> rechecked 140/92. She admits to smoking prior to appointment. Her past medical history includes hypertension and she sees cardiology for management. She is currently taking hydrochlorothiazide , Diovan  and metoprolol .        Review of Systems:  Review of Systems  Constitutional: Negative.   Respiratory: Negative.    Cardiovascular: Negative.   Skin:  Positive for itching and rash.  Psychiatric/Behavioral: Negative.      Past Medical History:  Diagnosis Date   Abnormal echocardiogram    a. 11/2016 - increased LVOT gradient. see 01/09/2017  cardiology OV in Epic   Asthma    rarely uses, only when sick   Chronic midline low back pain without sciatica    off and on   COVID-19 05/11/2022   given Paxlovid , pt stated she did not take due to being unable to swallow pills   GERD (gastroesophageal reflux disease)    Graves disease    Follows w/ Dr. Faythe, endocronologist with [facility].   Heart murmur    12/14/16 echocardiogram in Epic   Hypertension    Follows w/ PCP, Anne Medina-Vargus, NP.   Mild mitral regurgitation    12/14/16 echo in Epic   Normocytic anemia    Obesity    Thyrotoxicosis 11/2014   Tobacco abuse    As of 2024, smoking hx of 20 years.   Tricuspid regurgitation    12/14/16 echo in Epic   Wears glasses    Past Surgical History:  Procedure Laterality Date   ECTOPIC PREGNANCY SURGERY  2012   MANDIBLE SURGERY     around 2002   TUBAL LIGATION     one tube, ruptured during ectopic pregancy   Social History:   reports that she has been smoking cigarettes. She has a 20 pack-year smoking history. She has never used smokeless tobacco. She reports current alcohol use of about 1.0 standard drink of alcohol per week. She reports that she does not use drugs.  Family History  Problem Relation Age of Onset  Thyroid  disease Mother    Cervical cancer Mother        passed   Hypertension Father    Diabetes Maternal Grandmother    Thyroid  disease Maternal Grandmother    Heart murmur Paternal Grandmother    Diabetes Maternal Aunt    Diabetes Maternal Aunt    Kidney disease Maternal Aunt    Heart Problems Maternal Aunt     Medications: Patient's Medications  New Prescriptions   No medications on file  Previous Medications   HYDROCHLOROTHIAZIDE  (HYDRODIURIL ) 25 MG TABLET    Take 1 tablet (25 mg total) by mouth daily.   METHIMAZOLE  (TAPAZOLE ) 10 MG TABLET    Take 3 tablets (30 mg total) by mouth daily.   METOPROLOL  TARTRATE (LOPRESSOR ) 25 MG TABLET    Take 25 mg by mouth 2 (two) times daily.   NICOTINE   POLACRILEX (NICORETTE ) 4 MG GUM    Chew 1 piece (4 mg total) of gum as needed for smoking cessation as directed on package.   VALSARTAN  (DIOVAN ) 80 MG TABLET    Take 1 tablet (80 mg total) by mouth daily.  Modified Medications   No medications on file  Discontinued Medications   No medications on file    Physical Exam:  Vitals:   11/25/23 1429  BP: (!) 154/110  Pulse: (!) 103  Resp: 16  Temp: 97.7 F (36.5 C)  SpO2: 97%  Weight: 283 lb (128.4 kg)  Height: 5' 10 (1.778 m)   Body mass index is 40.61 kg/m. Wt Readings from Last 3 Encounters:  11/25/23 283 lb (128.4 kg)  08/16/23 285 lb (129.3 kg)  07/15/23 285 lb 3.2 oz (129.4 kg)    Physical Exam Vitals reviewed.  Constitutional:      General: She is not in acute distress. HENT:     Head: Normocephalic.  Eyes:     General:        Right eye: No discharge.        Left eye: No discharge.  Cardiovascular:     Rate and Rhythm: Normal rate and regular rhythm.     Pulses: Normal pulses.     Heart sounds: Normal heart sounds.  Pulmonary:     Effort: Pulmonary effort is normal.     Breath sounds: Normal breath sounds.  Musculoskeletal:     Cervical back: Neck supple.  Skin:    Capillary Refill: Capillary refill takes less than 2 seconds.     Findings: Rash present.     Comments: Large circular rash to right upper arm and forearm, no skin breakdown. Small papular rash to right side resembling mosquito bites, no skin breakdown.   Neurological:     General: No focal deficit present.     Mental Status: She is alert and oriented to person, place, and time.  Psychiatric:        Mood and Affect: Mood normal.     Labs reviewed: Basic Metabolic Panel: Recent Labs    12/11/22 1024 03/11/23 1432 07/15/23 1112 08/16/23 1257  NA  --   --  139 137  K  --   --  4.6 3.9  CL  --   --  105 103  CO2  --   --  21 23  GLUCOSE  --   --  79 95  BUN  --   --  12 15  CREATININE  --   --  0.90 1.07*  CALCIUM  --   --  9.1 9.2  MG  --   --  2.0  --   TSH <0.01* 6.71*  --   --    Liver Function Tests: Recent Labs    07/15/23 1112  AST 15  ALT 6  ALKPHOS 98  BILITOT 0.9  PROT 7.2  ALBUMIN 4.4   No results for input(s): LIPASE, AMYLASE in the last 8760 hours. No results for input(s): AMMONIA in the last 8760 hours. CBC: Recent Labs    08/16/23 1257  WBC 9.0  HGB 14.5  HCT 42.9  MCV 93.3  PLT 197   Lipid Panel: Recent Labs    07/15/23 1112  CHOL 165  HDL 38*  LDLCALC 104*  TRIG 130  CHOLHDL 4.3   TSH: Recent Labs    12/11/22 1024 03/11/23 1432  TSH <0.01* 6.71*   A1C: Lab Results  Component Value Date   HGBA1C 5.4 05/28/2022     Assessment/Plan 1. Ringworm, body (Primary) - onset 08/03 - large circular rash to right upper arm and forearm  - suspect ringworm due to appearance - start terbinafine  x 14 days  - terbinafine  (LAMISIL ) 250 MG tablet; Take 1 tablet (250 mg total) by mouth daily for 14 days.  Dispense: 14 tablet; Refill: 0  2. Rash - onset 08/03 - papular rash to right side/waist - resemble mosquito bites  - triamcinolone  cream (KENALOG ) 0.1 %; Apply 1 Application topically 2 (two) times daily. Apply to rash on left side  Dispense: 30 g; Refill: 0  3. Primary hypertension - BP 154/110> rechecked 140/92, goal < 140/80 - followed by cardiology - smoked cigarette right before encounter - cont hydrochlorothiazide , metoprolol  and Diovan  - advised to schedule with cardiology  Total time: 28 minutes. Greater than 50% of total time spent doing patient education regarding ringworm, rash and HTN including symptom/medication management.     Next appt: Visit date not found  Helen Winterhalter Gil BODILY  [facility] [phone]

## 2023-12-30 ENCOUNTER — Encounter: Payer: Self-pay | Admitting: Adult Health

## 2023-12-30 ENCOUNTER — Ambulatory Visit: Payer: Self-pay | Admitting: Adult Health

## 2023-12-30 ENCOUNTER — Ambulatory Visit (INDEPENDENT_AMBULATORY_CARE_PROVIDER_SITE_OTHER): Admitting: Adult Health

## 2023-12-30 VITALS — BP 134/78 | HR 96 | Temp 97.3°F | Resp 19 | Ht 70.0 in | Wt 289.4 lb

## 2023-12-30 DIAGNOSIS — I10 Essential (primary) hypertension: Secondary | ICD-10-CM | POA: Diagnosis not present

## 2023-12-30 DIAGNOSIS — J069 Acute upper respiratory infection, unspecified: Secondary | ICD-10-CM

## 2023-12-30 DIAGNOSIS — E0590 Thyrotoxicosis, unspecified without thyrotoxic crisis or storm: Secondary | ICD-10-CM | POA: Diagnosis not present

## 2023-12-30 DIAGNOSIS — R051 Acute cough: Secondary | ICD-10-CM

## 2023-12-30 DIAGNOSIS — Z124 Encounter for screening for malignant neoplasm of cervix: Secondary | ICD-10-CM

## 2023-12-30 LAB — TSH: TSH: <0.01 m[IU]/L — ABNORMAL LOW

## 2023-12-30 LAB — POCT INFLUENZA A/B
Influenza A, POC: NEGATIVE
Influenza B, POC: NEGATIVE

## 2023-12-30 LAB — T3, FREE: T3, Free: 9.1 pg/mL — ABNORMAL HIGH (ref 2.3–4.2)

## 2023-12-30 LAB — POC COVID19 BINAXNOW: SARS Coronavirus 2 Ag: NEGATIVE

## 2023-12-30 LAB — T4, FREE: Free T4: 2.2 ng/dL — ABNORMAL HIGH (ref 0.8–1.8)

## 2023-12-30 MED ORDER — BENZONATATE 100 MG PO CAPS: 100.0000 mg | ORAL_CAPSULE | Freq: Three times a day (TID) | ORAL | 0 refills | Status: DC | PRN

## 2023-12-30 MED ORDER — BENZONATATE 100 MG PO CAPS: 100.0000 mg | ORAL_CAPSULE | Freq: Three times a day (TID) | ORAL | 0 refills | Status: AC | PRN

## 2023-12-30 NOTE — Telephone Encounter (Signed)
 FYI Only or Action Required?: FYI only for provider.  Patient was last seen in primary care on 11/25/2023 by Gil Greig BRAVO, NP.  Called Nurse Triage reporting Cough.  Symptoms began a week ago.  Interventions attempted: OTC medications: Theraflu.  Triage Disposition: See HCP Within 4 Hours (Or PCP Triage)  Patient/caregiver understands and will follow disposition?: Yes              Copied from CRM #[id number]. Topic: Clinical - Red Word Triage >> Dec 30, 2023 11:29 AM Anne [name] wrote: [facility] that prompted transfer to Nurse Triage: sob, cough, congestion Reason for Disposition  [1] MILD difficulty breathing (e.[name]., minimal/no SOB at rest, SOB with walking, pulse < 100) AND [2] still present when not coughing  Answer Assessment - Initial Assessment Questions This RN scheduled pt for an appointment in office with PCP today. This RN educated pt on new-worsening symptoms and when to call back/seek emergent care. Pt verbalized understanding and agrees to plan.    ONSET: When did the cough begin?      8/30  SPUTUM: Describe the color of your sputum (e.[name]., none, dry cough; clear, white, yellow, green)     Light green and white   HEMOPTYSIS: Are you coughing up any blood? If Yes, ask: How much? (e.[name]., flecks, streaks, tablespoons, etc.)     No  DIFFICULTY BREATHING: Are you having difficulty breathing? If Yes, ask: How bad is it? (e.[name]., mild, moderate, severe)      SOB   FEVER: Do you have a fever? If Yes, ask: What is your temperature, how was it measured, and when did it start?     No  OTHER SYMPTOMS: Do you have any other symptoms? (e.[name]., runny nose, wheezing, chest pain)     Has had wheezing, runny nose   Denies chest pain  Protocols used: Cough - Acute Productive-A-AH

## 2023-12-30 NOTE — Progress Notes (Unsigned)
 [facility] clinic  Provider:  [name] Serum DNP  Code Status:  Full Code  Goals of Care:     05/21/2023    9:17 AM  Advanced Directives  Does Patient Have a Medical Advance Directive? No  Would patient like information on creating a medical advance directive? No - Patient declined     Chief Complaint  Patient presents with   Cough    cough   Discussed the use of AI scribe software for clinical note transcription with the patient, who gave verbal consent to proceed.   HPI: Patient is a 43 y.o. female seen today for an acute visit for History of Present Illness      Past Medical History:  Diagnosis Date   Abnormal echocardiogram    a. 11/2016 - increased LVOT gradient. see 01/09/2017 cardiology OV in Epic   Asthma    rarely uses, only when sick   Chronic midline low back pain without sciatica    off and on   COVID-19 05/11/2022   given Paxlovid , pt stated she did not take due to being unable to swallow pills   GERD (gastroesophageal reflux disease)    Graves disease    Follows w/ Dr. Faythe, endocronologist with [facility].   Heart murmur    12/14/16 echocardiogram in Epic   Hypertension    Follows w/ PCP, Shakeya Kerkman Medina-Vargus, NP.   Mild mitral regurgitation    12/14/16 echo in Epic   Normocytic anemia    Obesity    Thyrotoxicosis 11/2014   Tobacco abuse    As of 2024, smoking hx of 20 years.   Tricuspid regurgitation    12/14/16 echo in Epic   Wears glasses     Past Surgical History:  Procedure Laterality Date   ECTOPIC PREGNANCY SURGERY  2012   MANDIBLE SURGERY     around 2002   TUBAL LIGATION     one tube, ruptured during ectopic pregancy    Allergies  Allergen Reactions   Food Allergy Formula Anaphylaxis and Swelling    coconut   Shellfish Allergy Swelling   Amlodipine  Swelling    Outpatient Encounter Medications as of 12/30/2023  Medication Sig   hydrochlorothiazide  (HYDRODIURIL ) 25 MG tablet Take 1 tablet (25 mg total) by mouth  daily.   methimazole  (TAPAZOLE ) 10 MG tablet Take 3 tablets (30 mg total) by mouth daily.   metoprolol  tartrate (LOPRESSOR ) 25 MG tablet Take 25 mg by mouth 2 (two) times daily.   nicotine  polacrilex (NICORETTE ) 4 MG gum Chew 1 piece (4 mg total) of gum as needed for smoking cessation as directed on package.   triamcinolone  cream (KENALOG ) 0.1 % Apply 1 Application topically 2 (two) times daily. Apply to rash on left side   valsartan  (DIOVAN ) 80 MG tablet Take 1 tablet (80 mg total) by mouth daily.   [DISCONTINUED] benzonatate  (TESSALON  PERLES) 100 MG capsule Take 1 capsule (100 mg total) by mouth 3 (three) times daily as needed for up to 7 days.   benzonatate  (TESSALON  PERLES) 100 MG capsule Take 1 capsule (100 mg total) by mouth 3 (three) times daily as needed.   [DISCONTINUED] benzonatate  (TESSALON  PERLES) 100 MG capsule Take 1 capsule (100 mg total) by mouth 3 (three) times daily as needed for up to 7 days.   [DISCONTINUED] benzonatate  (TESSALON  PERLES) 100 MG capsule Take 1 capsule (100 mg total) by mouth 3 (three) times daily as needed for up to 7 days.   [DISCONTINUED] benzonatate  (TESSALON  PERLES) 100 MG  capsule Take 1 capsule (100 mg total) by mouth 3 (three) times daily as needed for up to 7 days.   [DISCONTINUED] benzonatate  (TESSALON  PERLES) 100 MG capsule Take 1 capsule (100 mg total) by mouth 3 (three) times daily as needed for up to 7 days.   [DISCONTINUED] benzonatate  (TESSALON  PERLES) 100 MG capsule Take 1 capsule (100 mg total) by mouth 3 (three) times daily as needed for up to 7 days.   [DISCONTINUED] benzonatate  (TESSALON  PERLES) 100 MG capsule Take 1 capsule (100 mg total) by mouth 3 (three) times daily as needed.   No facility-administered encounter medications on file as of 12/30/2023.    Review of Systems:  Review of Systems  Constitutional:  Negative for appetite change, chills, fatigue and fever.  HENT:  Negative for congestion, hearing loss, rhinorrhea and sore throat.    Eyes: Negative.   Respiratory:  Positive for cough and shortness of breath. Negative for wheezing.   Cardiovascular:  Negative for chest pain, palpitations and leg swelling.  Gastrointestinal:  Negative for abdominal pain, constipation, diarrhea, nausea and vomiting.  Genitourinary:  Negative for dysuria.  Musculoskeletal:  Negative for arthralgias, back pain and myalgias.  Skin:  Negative for color change, rash and wound.  Neurological:  Negative for dizziness, weakness and headaches.  Psychiatric/Behavioral:  Negative for behavioral problems. The patient is not nervous/anxious.     Health Maintenance  Topic Date Due   Hepatitis B Vaccines 19-59 Average Risk (1 of 3 - 19+ 3-dose series) Never done   HPV VACCINES (1 - 3-dose SCDM series) Never done   Cervical Cancer Screening (HPV/Pap Cotest)  Never done   DTaP/Tdap/Td (2 - Td or Tdap) 12/07/2032   Hepatitis C Screening  Completed   HIV Screening  Completed   Meningococcal B Vaccine  Aged Out   Pneumococcal Vaccine  Discontinued   Influenza Vaccine  Discontinued   COVID-19 Vaccine  Discontinued    Physical Exam: Vitals:   12/30/23 1507  BP: 134/78  Pulse: 96  Resp: 19  Temp: (!) 97.3 F (36.3 C)  SpO2: 97%  Weight: 289 lb 6.4 oz (131.3 kg)  Height: 5' 10 (1.778 m)   Body mass index is 41.52 kg/m. Physical Exam Constitutional:      Appearance: Normal appearance.  HENT:     Head: Normocephalic and atraumatic.     Nose: Nose normal.     Mouth/Throat:     Mouth: Mucous membranes are moist.  Eyes:     Conjunctiva/sclera: Conjunctivae normal.  Cardiovascular:     Rate and Rhythm: Normal rate and regular rhythm.  Pulmonary:     Effort: Pulmonary effort is normal.     Breath sounds: Normal breath sounds.  Abdominal:     General: Bowel sounds are normal.     Palpations: Abdomen is soft.  Musculoskeletal:        General: Swelling present. Normal range of motion.     Cervical back: Normal range of motion.     Right  lower leg: Edema present.     Left lower leg: Edema present.     Comments: BLE trace edema  Skin:    General: Skin is warm and dry.  Neurological:     General: No focal deficit present.     Mental Status: She is alert and oriented to person, place, and time.  Psychiatric:        Mood and Affect: Mood normal.        Behavior: Behavior normal.  Thought Content: Thought content normal.        Judgment: Judgment normal.     Labs reviewed: Basic Metabolic Panel: Recent Labs    03/11/23 1432 07/15/23 1112 08/16/23 1257  NA  --  139 137  K  --  4.6 3.9  CL  --  105 103  CO2  --  21 23  GLUCOSE  --  79 95  BUN  --  12 15  CREATININE  --  0.90 1.07*  CALCIUM  --  9.1 9.2  MG  --  2.0  --   TSH 6.71*  --   --    Liver Function Tests: Recent Labs    07/15/23 1112  AST 15  ALT 6  ALKPHOS 98  BILITOT 0.9  PROT 7.2  ALBUMIN 4.4   No results for input(s): LIPASE, AMYLASE in the last 8760 hours. No results for input(s): AMMONIA in the last 8760 hours. CBC: Recent Labs    08/16/23 1257  WBC 9.0  HGB 14.5  HCT 42.9  MCV 93.3  PLT 197   Lipid Panel: Recent Labs    07/15/23 1112  CHOL 165  HDL 38*  LDLCALC 104*  TRIG 130  CHOLHDL 4.3   Lab Results  Component Value Date   HGBA1C 5.4 05/28/2022    Procedures since last visit: No results found.  Assessment/Plan   Lab Results  Component Value Date   TSH 6.71 (H) 03/11/2023     Labs/tests ordered:   COVID-19 vaccine, influenza A/B   Return in about 6 months (around 06/28/2024).  Oyuki Hogan [name], NP

## 2023-12-30 NOTE — Telephone Encounter (Signed)
 Appointment scheduled for 12/30/2023 for evaluation.

## 2024-01-01 ENCOUNTER — Ambulatory Visit: Payer: Self-pay | Admitting: Adult Health

## 2024-01-01 ENCOUNTER — Other Ambulatory Visit: Payer: Self-pay | Admitting: Adult Health

## 2024-01-01 DIAGNOSIS — E0500 Thyrotoxicosis with diffuse goiter without thyrotoxic crisis or storm: Secondary | ICD-10-CM

## 2024-01-01 MED ORDER — METHIMAZOLE 10 MG PO TABS
20.0000 mg | ORAL_TABLET | Freq: Two times a day (BID) | ORAL | 1 refills | Status: AC
Start: 2024-01-01 — End: ?

## 2024-01-01 NOTE — Progress Notes (Signed)
-    tsh, free T4, free T3, all elevated,will increase Methimazole  and will need to follow up with endocrinology

## 2024-02-24 ENCOUNTER — Encounter: Payer: Self-pay | Admitting: Radiology

## 2024-03-06 ENCOUNTER — Other Ambulatory Visit: Payer: Self-pay

## 2024-03-06 ENCOUNTER — Encounter (HOSPITAL_BASED_OUTPATIENT_CLINIC_OR_DEPARTMENT_OTHER): Payer: Self-pay | Admitting: Emergency Medicine

## 2024-03-06 ENCOUNTER — Emergency Department (HOSPITAL_BASED_OUTPATIENT_CLINIC_OR_DEPARTMENT_OTHER)
Admission: EM | Admit: 2024-03-06 | Discharge: 2024-03-06 | Disposition: A | Discharge status: Home / Self Care | Attending: Emergency Medicine | Admitting: Emergency Medicine

## 2024-03-06 ENCOUNTER — Emergency Department (HOSPITAL_BASED_OUTPATIENT_CLINIC_OR_DEPARTMENT_OTHER)

## 2024-03-06 ENCOUNTER — Emergency Department (HOSPITAL_COMMUNITY)

## 2024-03-06 DIAGNOSIS — I10 Essential (primary) hypertension: Secondary | ICD-10-CM | POA: Insufficient documentation

## 2024-03-06 DIAGNOSIS — N83202 Unspecified ovarian cyst, left side: Secondary | ICD-10-CM | POA: Diagnosis not present

## 2024-03-06 DIAGNOSIS — Z79899 Other long term (current) drug therapy: Secondary | ICD-10-CM | POA: Insufficient documentation

## 2024-03-06 DIAGNOSIS — R1030 Lower abdominal pain, unspecified: Secondary | ICD-10-CM | POA: Diagnosis not present

## 2024-03-06 DIAGNOSIS — R102 Pelvic and perineal pain unspecified side: Secondary | ICD-10-CM | POA: Diagnosis not present

## 2024-03-06 DIAGNOSIS — N838 Other noninflammatory disorders of ovary, fallopian tube and broad ligament: Secondary | ICD-10-CM | POA: Diagnosis not present

## 2024-03-06 LAB — COMPREHENSIVE METABOLIC PANEL WITH GFR
ALT: 6 U/L (ref 0–44)
AST: 16 U/L (ref 15–41)
Albumin: 4 g/dL (ref 3.5–5.0)
Alkaline Phosphatase: 88 U/L (ref 38–126)
Anion gap: 9 (ref 5–15)
BUN: 15 mg/dL (ref 6–20)
CO2: 23 mmol/L (ref 22–32)
Calcium: 8.6 mg/dL — ABNORMAL LOW (ref 8.9–10.3)
Chloride: 107 mmol/L (ref 98–111)
Creatinine, Ser: 0.72 mg/dL (ref 0.44–1.00)
GFR, Estimated: >60 mL/min (ref >60)
Glucose, Bld: 103 mg/dL — ABNORMAL HIGH (ref 70–99)
Potassium: 4.4 mmol/L (ref 3.5–5.1)
Sodium: 140 mmol/L (ref 135–145)
Total Bilirubin: 0.3 mg/dL (ref 0.0–1.2)
Total Protein: 6.8 g/dL (ref 6.5–8.1)

## 2024-03-06 LAB — WET PREP, GENITAL
Clue Cells Wet Prep HPF POC: NONE SEEN
Sperm: NONE SEEN
Trich, Wet Prep: NONE SEEN
WBC, Wet Prep HPF POC: <10 (ref <10)
Yeast Wet Prep HPF POC: NONE SEEN

## 2024-03-06 LAB — CBC WITH DIFFERENTIAL/PLATELET
Abs Immature Granulocytes: 0.01 K/uL (ref 0.00–0.07)
Basophils Absolute: 0 K/uL (ref 0.0–0.1)
Basophils Relative: 0 %
Eosinophils Absolute: 0.2 K/uL (ref 0.0–0.5)
Eosinophils Relative: 3 %
HCT: 37 % (ref 36.0–46.0)
Hemoglobin: 12.1 g/dL (ref 12.0–15.0)
Immature Granulocytes: 0 %
Lymphocytes Relative: 32 %
Lymphs Abs: 2.6 K/uL (ref 0.7–4.0)
MCH: 30 pg (ref 26.0–34.0)
MCHC: 32.7 g/dL (ref 30.0–36.0)
MCV: 91.8 fL (ref 80.0–100.0)
Monocytes Absolute: 0.5 K/uL (ref 0.1–1.0)
Monocytes Relative: 6 %
Neutro Abs: 4.7 K/uL (ref 1.7–7.7)
Neutrophils Relative %: 59 %
Platelets: 203 K/uL (ref 150–400)
RBC: 4.03 MIL/uL (ref 3.87–5.11)
RDW: 14.2 % (ref 11.5–15.5)
WBC: 8.1 K/uL (ref 4.0–10.5)
nRBC: 0 % (ref 0.0–0.2)

## 2024-03-06 LAB — URINALYSIS, ROUTINE W REFLEX MICROSCOPIC
Glucose, UA: NEGATIVE mg/dL
Ketones, ur: NEGATIVE mg/dL
Leukocytes,Ua: NEGATIVE
Nitrite: NEGATIVE
Protein, ur: NEGATIVE mg/dL
Specific Gravity, Urine: >=1.03 (ref 1.005–1.030)
pH: 5.5 (ref 5.0–8.0)

## 2024-03-06 LAB — URINALYSIS, MICROSCOPIC (REFLEX)

## 2024-03-06 LAB — LIPASE, BLOOD: Lipase: 24 U/L (ref 11–51)

## 2024-03-06 LAB — HCG, SERUM, QUALITATIVE: Preg, Serum: NEGATIVE

## 2024-03-06 MED ORDER — HYDROMORPHONE HCL 1 MG/ML IJ SOLN
0.5000 mg | Freq: Once | INTRAMUSCULAR | Status: AC
  Administered 2024-03-06: 0.5 mg via INTRAVENOUS
  Filled 2024-03-06: qty 1

## 2024-03-06 MED ORDER — OXYCODONE HCL 5 MG PO TABS: 5.0000 mg | ORAL_TABLET | Freq: Four times a day (QID) | ORAL | 0 refills | Status: AC | PRN

## 2024-03-06 MED ORDER — HYDROMORPHONE HCL 1 MG/ML IJ SOLN
1.0000 mg | Freq: Once | INTRAMUSCULAR | Status: AC
  Administered 2024-03-06: 1 mg via INTRAVENOUS
  Filled 2024-03-06: qty 1

## 2024-03-06 MED ORDER — KETOROLAC TROMETHAMINE 30 MG/ML IJ SOLN
30.0000 mg | Freq: Once | INTRAMUSCULAR | Status: AC
  Administered 2024-03-06: 30 mg via INTRAVENOUS
  Filled 2024-03-06: qty 1

## 2024-03-06 MED ORDER — SODIUM CHLORIDE 0.9 % IV BOLUS
1000.0000 mL | Freq: Once | INTRAVENOUS | Status: AC
  Administered 2024-03-06: 1000 mL via INTRAVENOUS

## 2024-03-06 MED ORDER — MORPHINE SULFATE (PF) 4 MG/ML IV SOLN
4.0000 mg | Freq: Once | INTRAVENOUS | Status: AC
  Administered 2024-03-06: 4 mg via INTRAVENOUS
  Filled 2024-03-06: qty 1

## 2024-03-06 MED ORDER — IBUPROFEN 600 MG PO TABS: 600.0000 mg | ORAL_TABLET | Freq: Four times a day (QID) | ORAL | 0 refills | Status: AC | PRN

## 2024-03-06 MED ORDER — IOHEXOL 300 MG/ML  SOLN
125.0000 mL | Freq: Once | INTRAMUSCULAR | Status: AC | PRN
  Administered 2024-03-06: 125 mL via INTRAVENOUS

## 2024-03-06 MED ORDER — DEXAMETHASONE SOD PHOSPHATE PF 10 MG/ML IJ SOLN
6.0000 mg | Freq: Once | INTRAMUSCULAR | Status: AC
  Administered 2024-03-06: 6 mg via INTRAVENOUS

## 2024-03-06 MED ORDER — ONDANSETRON HCL 4 MG/2ML IJ SOLN
4.0000 mg | Freq: Once | INTRAMUSCULAR | Status: AC
  Administered 2024-03-06: 4 mg via INTRAVENOUS
  Filled 2024-03-06: qty 2

## 2024-03-06 NOTE — ED Notes (Signed)
 IV secured for POV to [facility].

## 2024-03-06 NOTE — ED Provider Notes (Signed)
 I took over care of this patient at 7 AM. She is still having pain. Was accepted at [facility] ER for ultrasound to ro torsion. She initially did not want to go but now is willing to go by private vehicle.  Physical Exam  BP (!) 134/92   Pulse 95   Temp (!) 97.4 F (36.3 C) (Oral)   Resp 20   Ht 5' 10 (1.778 m)   Wt 129.3 kg   SpO2 97%   BMI 40.89 kg/m   Physical Exam Vitals and nursing note reviewed.  Constitutional:      General: She is not in acute distress.    Appearance: She is well-developed.  HENT:     Head: Normocephalic and atraumatic.  Eyes:     Conjunctiva/sclera: Conjunctivae normal.  Cardiovascular:     Rate and Rhythm: Normal rate and regular rhythm.     Heart sounds: No murmur heard. Pulmonary:     Effort: Pulmonary effort is normal. No respiratory distress.     Breath sounds: Normal breath sounds.  Abdominal:     Palpations: Abdomen is soft.     Tenderness: There is abdominal tenderness.  Musculoskeletal:        General: No swelling.     Cervical back: Neck supple.  Skin:    General: Skin is warm and dry.     Capillary Refill: Capillary refill takes less than 2 seconds.  Neurological:     Mental Status: She is alert.  Psychiatric:        Mood and Affect: Mood normal.     Procedures  Procedures  ED Course / MDM    Medical Decision Making Patient signed out to me at 7 AM.  Initially did not want to go to, via private vehicle or EMS for rule out torsion by ultrasound since they are not here, however she changed her mind and is willing to go.  Her partner at bedside will drive her.  She was given some Dilaudid  prior to leaving the emergency department.  They will plan to go directly to [facility], ER.  Problems Addressed: Cyst of left ovary: acute illness or injury  Amount and/or Complexity of Data Reviewed Labs: ordered. Radiology: ordered.  Risk Prescription drug management. Drug therapy requiring intensive monitoring for toxicity.           Gennaro Duwaine CROME, DO 03/06/24 [phone]

## 2024-03-06 NOTE — Discharge Instructions (Addendum)
 Please call your OB/GYN office to schedule an urgent follow-up appointment.  You may be having pelvic pain due to a large cyst on your left ovaries.  If your pain gets more severe, if you are vomiting, cannot keep down food or fluids, or have any other emergency medical concerns, please return to the emergency department.

## 2024-03-06 NOTE — ED Triage Notes (Signed)
 Lower abdominal pain X 2 hours. Pt denies injury or Hx of same.

## 2024-03-06 NOTE — ED Provider Notes (Signed)
 Patient arrives from freestanding ED for pelvic ultrasound to rule out torsion.  She had been given IV pain medications in their ED. she did have a CT abdomen pelvis performed approximately 3 hours ago which I reviewed noting a large complex left adnexal space cyst, likely ovarian in origin -noted to have had a large adnexal cyst on the same study 2 years ago measuring 12 cm.  1030 am -no emergent findings on the patient's pelvic ultrasound.  She continues to complain of 8 out of 10 abdominal discomfort does appear quite uncomfortable on exam.  This is in fact a left-sided pelvic pain.  I reviewed her medical chart and she did have a pelvic exam performed by her initial ED provider, noted no cervical motion tenderness and no significant vaginal discharge to raise concern for infection.  I suspect she is symptomatic from her large left ovarian cyst, question of possible ruptured cyst, although there is no evident free fluid on CT or pelvic imaging.  I do not see an intra-abdominal source of her symptoms.  We have discussed with the patient and her husband an additional round of pain medicine including IV Toradol  and Decadron for anti-inflammatory effect.  She does have an OB/GYN that she can follow-up with.  She is not wanting to stay in the hospital for pain control.  We could discharge her with oral pain medicine at home.  1150 AM -pain is now significantly improved.  Patient is tolerating p.o.  She is ready to go home.  Her husband to take her   Cottie Donnice PARAS, MD 03/06/24 [phone]

## 2024-03-06 NOTE — ED Provider Notes (Signed)
 [place] [facility] AT [facility] Provider Note   CSN: [id number] Arrival date & time: 03/06/24  9542     Patient presents with: Abdominal Pain   Anne [name] is a 43 y.o. female.   The history is provided by the patient.  Abdominal Pain Anne [name] is a 43 y.o. female who presents to the Emergency Department complaining of abdominal pain.  She presents to the emergency department for evaluation of severe and sudden onset lower abdominal pain that started 3 to 4 hours ago.  She has nausea.  No fever, vomiting, dysuria, diarrhea, vaginal discharge.  No prior similar symptoms.  She has a history of Graves' disease, hypertension.  She does have a remote history of ectopic pregnancy in 2005 that was treated surgically.  Last menstrual cycle was October 1.      Prior to Admission medications   Medication Sig Start Date End Date Taking? Authorizing Provider  hydrochlorothiazide  (HYDRODIURIL ) 25 MG tablet Take 1 tablet (25 mg total) by mouth daily. 07/15/23 12/30/23  Tobb, Kardie, DO  methimazole  (TAPAZOLE ) 10 MG tablet Take 2 tablets (20 mg total) by mouth 2 (two) times daily. 01/01/24   [name], Monina C, NP  metoprolol  tartrate (LOPRESSOR ) 25 MG tablet Take 25 mg by mouth 2 (two) times daily.    [provider]  nicotine  polacrilex (NICORETTE ) 4 MG gum Chew 1 piece (4 mg total) of gum as needed for smoking cessation as directed on package. 09/10/23   Tobb, Kardie, DO  triamcinolone  cream (KENALOG ) 0.1 % Apply 1 Application topically 2 (two) times daily. Apply to rash on left side 11/25/23   Fargo, Amy E, NP  valsartan  (DIOVAN ) 80 MG tablet Take 1 tablet (80 mg total) by mouth daily. 09/10/23   Tobb, Kardie, DO    Allergies: Food allergy formula, Shellfish allergy, and Amlodipine     Review of Systems  Gastrointestinal:  Positive for abdominal pain.  All other systems reviewed and are negative.   Updated Vital Signs BP (!) 134/92   Pulse 95    Temp (!) 97.4 F (36.3 C) (Oral)   Resp 20   Ht 5' 10 (1.778 m)   Wt 129.3 kg   SpO2 97%   BMI 40.89 kg/m   Physical Exam Vitals and nursing note reviewed.  Constitutional:      Appearance: She is well-developed.  HENT:     Head: Normocephalic and atraumatic.  Cardiovascular:     Rate and Rhythm: Normal rate and regular rhythm.  Pulmonary:     Effort: Pulmonary effort is normal. No respiratory distress.  Abdominal:     Palpations: Abdomen is soft.     Tenderness: There is no guarding or rebound.     Comments: Moderate lower abdominal tenderness, greatest over the right lower quadrant  Genitourinary:    Comments: Scant vaginal discharge.  No CMT. Musculoskeletal:        General: No tenderness.  Skin:    General: Skin is warm and dry.  Neurological:     Mental Status: She is alert and oriented to person, place, and time.  Psychiatric:        Behavior: Behavior normal.     (all labs ordered are listed, but only abnormal results are displayed) Labs Reviewed  COMPREHENSIVE METABOLIC PANEL WITH GFR - Abnormal; Notable for the following components:      Result Value   Glucose, Bld 103 (*)    Calcium 8.6 (*)    All other  components within normal limits  URINALYSIS, ROUTINE W REFLEX MICROSCOPIC - Abnormal; Notable for the following components:   APPearance HAZY (*)    Hgb urine dipstick TRACE (*)    Bilirubin Urine SMALL (*)    All other components within normal limits  URINALYSIS, MICROSCOPIC (REFLEX) - Abnormal; Notable for the following components:   Bacteria, UA RARE (*)    All other components within normal limits  WET PREP, GENITAL  LIPASE, BLOOD  CBC WITH DIFFERENTIAL/PLATELET  HCG, SERUM, QUALITATIVE  GC/CHLAMYDIA PROBE AMP ([place]) NOT AT [facility]    EKG: None  Radiology: CT ABDOMEN PELVIS W CONTRAST Result Date: 03/06/2024 CLINICAL DATA:  Abdominal pain. EXAM: CT ABDOMEN AND PELVIS WITH CONTRAST TECHNIQUE: Multidetector CT imaging of the abdomen  and pelvis was performed using the standard protocol following bolus administration of intravenous contrast. RADIATION DOSE REDUCTION: This exam was performed according to the departmental dose-optimization program which includes automated exposure control, adjustment of the mA and/or kV according to patient size and/or use of iterative reconstruction technique. CONTRAST:  OMNIPAQUE  IOHEXOL  300 MG/ML  SOLN COMPARISON:  03/30/2022 FINDINGS: Lower chest: Unremarkable. Hepatobiliary: No suspicious focal abnormality within the liver parenchyma. There is no evidence for gallstones, gallbladder wall thickening, or pericholecystic fluid. No intrahepatic or extrahepatic biliary dilation. Pancreas: No focal mass lesion. No dilatation of the main duct. No intraparenchymal cyst. No peripancreatic edema. Spleen: No splenomegaly. No suspicious focal mass lesion. Adrenals/Urinary Tract: No adrenal nodule or mass. Kidneys unremarkable. No evidence for hydroureter. The urinary bladder appears normal for the degree of distention. Stomach/Bowel: Stomach is unremarkable. No gastric wall thickening. No evidence of outlet obstruction. Duodenum is normally positioned as is the ligament of Treitz. No small bowel wall thickening. No small bowel dilatation. The terminal ileum is normal. The appendix is normal. No gross colonic mass. No colonic wall thickening. Vascular/Lymphatic: No abdominal aortic aneurysm. No abdominal aortic atherosclerotic calcification. There is no gastrohepatic or hepatoduodenal ligament lymphadenopathy. No retroperitoneal or mesenteric lymphadenopathy. No pelvic sidewall lymphadenopathy. Reproductive: Uterus unremarkable. Right ovary unremarkable. 10.7 x 8.5 x 7.9 cm complex cyst is identified in the left adnexal space, likely ovarian origin. CT imaging features suspicious for neoplasm. Patient had a larger left adnexal cyst on the study from 2 years ago measuring up to 12 cm at that time. Other: No  intraperitoneal free fluid. Musculoskeletal: No worrisome lytic or sclerotic osseous abnormality. IMPRESSION: 1. 10.7 x 8.5 x 7.9 cm complex cyst in the left adnexal space, likely ovarian origin. Lesion has a peripheral septation but no obvious mural nodularity. Patient had a larger left adnexal cyst on the study from 2 years ago measuring up to 12 cm at that time. Given that this has apparently decreased in size, the lesion is likely benign. Correlation with gynecologic history recommended. Gynecology consultation recommended. 2. Otherwise no acute findings. Electronically Signed   By: Camellia Candle M.D.   On: 03/06/2024 06:40     Procedures   Medications Ordered in the ED  morphine  (PF) 4 MG/ML injection 4 mg (4 mg Intravenous Given 03/06/24 0546)  ondansetron  (ZOFRAN ) injection 4 mg (4 mg Intravenous Given 03/06/24 0546)  sodium chloride  0.9 % bolus 1,000 mL (1,000 mLs Intravenous New Bag/Given 03/06/24 0546)  iohexol  (OMNIPAQUE ) 300 MG/ML solution 125 mL (125 mLs Intravenous Contrast Given 03/06/24 0619)  HYDROmorphone  (DILAUDID ) injection 1 mg (1 mg Intravenous Given 03/06/24 0639)  Medical Decision Making Amount and/or Complexity of Data Reviewed Labs: ordered. Radiology: ordered.  Risk Prescription drug management.   Patient with history of hypertension, Graves' disease ovarian cyst here for evaluation sudden onset lower abdominal pain.  She has generalized lower abdominal tenderness on examination.  Pelvic examination is benign.  CT on pelvis demonstrates large ovarian cyst.  There is concern for torsion given her ongoing pain.  She did have transient relief with morphine  but her pain did recur.  She was treated with hydromorphone .  Ultrasound is not available at this facility at this hour.  Discussed with Dr. Abigail with gynecology-recommends transfer to the [facility] emergency department for ultrasound.  Discussed with Dr. Florene in the emergency  department, who accepted transfer.  On repeat assessment patient is hesitant for transfer given her pain.  Discussed that longer duration of symptoms have an increased risk of harm to her ovary due to the decreased blood flow.  Patient care transferred pending pelvic ultrasound and assessment of patient's willingness to transfer to another department for imaging.     Final diagnoses:  Cyst of left ovary    ED Discharge Orders     None          Griselda Norris, MD 03/06/24 [phone]

## 2024-03-06 NOTE — ED Notes (Signed)
Pt provided turkey sandwich and drink

## 2024-03-06 NOTE — ED Notes (Signed)
 Pt left with significant other via POV to [facility] ED for US  testing . Pt stable alert and oriented x 4 , pt left with secured IV .

## 2024-03-09 LAB — GC/CHLAMYDIA PROBE AMP (~~LOC~~) NOT AT ARMC
Chlamydia: NEGATIVE
Comment: NEGATIVE
Comment: NORMAL
Neisseria Gonorrhea: NEGATIVE

## 2024-04-29 ENCOUNTER — Other Ambulatory Visit: Payer: Self-pay | Admitting: Orthopedic Surgery

## 2024-04-29 DIAGNOSIS — R21 Rash and other nonspecific skin eruption: Secondary | ICD-10-CM

## 2024-04-30 ENCOUNTER — Other Ambulatory Visit: Payer: Self-pay | Admitting: Adult Health

## 2024-04-30 DIAGNOSIS — R21 Rash and other nonspecific skin eruption: Secondary | ICD-10-CM

## 2024-04-30 MED ORDER — TRIAMCINOLONE ACETONIDE 0.1 % EX CREA: TOPICAL_CREAM | Freq: Two times a day (BID) | CUTANEOUS | 0 refills | Status: AC | PRN

## 2024-04-30 NOTE — Telephone Encounter (Signed)
 Please advise. Medication was prescribed 8/4 due to a rash

## 2024-05-08 ENCOUNTER — Encounter: Admitting: Adult Health

## 2024-05-08 NOTE — Progress Notes (Signed)
 This encounter was created in error - please disregard.

## 2024-05-13 ENCOUNTER — Ambulatory Visit: Admitting: "Endocrinology

## 2024-06-29 ENCOUNTER — Ambulatory Visit: Payer: Self-pay | Admitting: Adult Health
